# Patient Record
Sex: Female | Born: 1960 | Race: White | Hispanic: No | Marital: Married | Smoking: Never smoker
Health system: Southern US, Community
[De-identification: ages and names within clinical notes are randomized; demographics above are authoritative.]

## PROBLEM LIST (undated history)

## (undated) DIAGNOSIS — G709 Myoneural disorder, unspecified: Secondary | ICD-10-CM

## (undated) DIAGNOSIS — T7840XA Allergy, unspecified, initial encounter: Secondary | ICD-10-CM

## (undated) DIAGNOSIS — K589 Irritable bowel syndrome without diarrhea: Secondary | ICD-10-CM

## (undated) DIAGNOSIS — M519 Unspecified thoracic, thoracolumbar and lumbosacral intervertebral disc disorder: Secondary | ICD-10-CM

## (undated) DIAGNOSIS — D649 Anemia, unspecified: Secondary | ICD-10-CM

## (undated) DIAGNOSIS — M545 Low back pain, unspecified: Secondary | ICD-10-CM

## (undated) DIAGNOSIS — Z9889 Other specified postprocedural states: Secondary | ICD-10-CM

## (undated) DIAGNOSIS — M1990 Unspecified osteoarthritis, unspecified site: Secondary | ICD-10-CM

## (undated) DIAGNOSIS — E785 Hyperlipidemia, unspecified: Secondary | ICD-10-CM

## (undated) DIAGNOSIS — N83209 Unspecified ovarian cyst, unspecified side: Secondary | ICD-10-CM

## (undated) DIAGNOSIS — E039 Hypothyroidism, unspecified: Secondary | ICD-10-CM

## (undated) DIAGNOSIS — F32A Depression, unspecified: Secondary | ICD-10-CM

## (undated) DIAGNOSIS — I493 Ventricular premature depolarization: Secondary | ICD-10-CM

## (undated) DIAGNOSIS — K219 Gastro-esophageal reflux disease without esophagitis: Secondary | ICD-10-CM

## (undated) DIAGNOSIS — F419 Anxiety disorder, unspecified: Secondary | ICD-10-CM

## (undated) DIAGNOSIS — E611 Iron deficiency: Secondary | ICD-10-CM

## (undated) DIAGNOSIS — M5090 Cervical disc disorder, unspecified, unspecified cervical region: Secondary | ICD-10-CM

## (undated) DIAGNOSIS — R112 Nausea with vomiting, unspecified: Secondary | ICD-10-CM

## (undated) DIAGNOSIS — F329 Major depressive disorder, single episode, unspecified: Secondary | ICD-10-CM

## (undated) DIAGNOSIS — S92902A Unspecified fracture of left foot, initial encounter for closed fracture: Secondary | ICD-10-CM

## (undated) HISTORY — DX: Nausea with vomiting, unspecified: R11.2

## (undated) HISTORY — DX: Unspecified ovarian cyst, unspecified side: N83.209

## (undated) HISTORY — DX: Unspecified thoracic, thoracolumbar and lumbosacral intervertebral disc disorder: M51.9

## (undated) HISTORY — DX: Low back pain, unspecified: M54.50

## (undated) HISTORY — DX: Ventricular premature depolarization: I49.3

## (undated) HISTORY — DX: Unspecified fracture of left foot, initial encounter for closed fracture: S92.902A

## (undated) HISTORY — DX: Other specified postprocedural states: Z98.890

## (undated) HISTORY — DX: Irritable bowel syndrome, unspecified: K58.9

## (undated) HISTORY — PX: OTHER SURGICAL HISTORY: SHX169

## (undated) HISTORY — PX: FRACTURE SURGERY: SHX138

## (undated) HISTORY — DX: Myoneural disorder, unspecified: G70.9

## (undated) HISTORY — PX: VAGINAL HYSTERECTOMY: SUR661

## (undated) HISTORY — DX: Anxiety disorder, unspecified: F41.9

## (undated) HISTORY — DX: Unspecified osteoarthritis, unspecified site: M19.90

## (undated) HISTORY — DX: Hyperlipidemia, unspecified: E78.5

## (undated) HISTORY — DX: Hypothyroidism, unspecified: E03.9

## (undated) HISTORY — DX: Iron deficiency: E61.1

## (undated) HISTORY — PX: COLONOSCOPY: SHX174

## (undated) HISTORY — DX: Cervical disc disorder, unspecified, unspecified cervical region: M50.90

## (undated) HISTORY — DX: Major depressive disorder, single episode, unspecified: F32.9

## (undated) HISTORY — DX: Depression, unspecified: F32.A

## (undated) HISTORY — DX: Allergy, unspecified, initial encounter: T78.40XA

## (undated) HISTORY — DX: Low back pain: M54.5

## (undated) HISTORY — DX: Anemia, unspecified: D64.9

## (undated) HISTORY — DX: Gastro-esophageal reflux disease without esophagitis: K21.9

---

## 1998-11-14 ENCOUNTER — Encounter: Admission: RE | Admit: 1998-11-14 | Discharge: 1998-11-14 | Payer: Self-pay | Admitting: *Deleted

## 1998-11-17 ENCOUNTER — Other Ambulatory Visit: Admission: RE | Admit: 1998-11-17 | Discharge: 1998-11-17 | Payer: Self-pay | Admitting: *Deleted

## 1999-12-18 ENCOUNTER — Other Ambulatory Visit: Admission: RE | Admit: 1999-12-18 | Discharge: 1999-12-18 | Payer: Self-pay | Admitting: *Deleted

## 2000-03-22 ENCOUNTER — Encounter (INDEPENDENT_AMBULATORY_CARE_PROVIDER_SITE_OTHER): Payer: Self-pay | Admitting: Specialist

## 2000-03-22 ENCOUNTER — Other Ambulatory Visit: Admission: RE | Admit: 2000-03-22 | Discharge: 2000-03-22 | Payer: Self-pay | Admitting: *Deleted

## 2000-08-15 ENCOUNTER — Observation Stay (HOSPITAL_COMMUNITY): Admission: RE | Admit: 2000-08-15 | Discharge: 2000-08-16 | Payer: Self-pay | Admitting: *Deleted

## 2000-08-15 ENCOUNTER — Encounter (INDEPENDENT_AMBULATORY_CARE_PROVIDER_SITE_OTHER): Payer: Self-pay | Admitting: Specialist

## 2001-02-18 ENCOUNTER — Other Ambulatory Visit: Admission: RE | Admit: 2001-02-18 | Discharge: 2001-02-18 | Payer: Self-pay | Admitting: *Deleted

## 2002-03-10 ENCOUNTER — Other Ambulatory Visit: Admission: RE | Admit: 2002-03-10 | Discharge: 2002-03-10 | Payer: Self-pay | Admitting: *Deleted

## 2002-03-19 ENCOUNTER — Ambulatory Visit (HOSPITAL_COMMUNITY): Admission: RE | Admit: 2002-03-19 | Discharge: 2002-03-19 | Payer: Self-pay | Admitting: *Deleted

## 2002-03-19 ENCOUNTER — Encounter: Payer: Self-pay | Admitting: *Deleted

## 2003-11-09 ENCOUNTER — Ambulatory Visit (HOSPITAL_COMMUNITY): Admission: RE | Admit: 2003-11-09 | Discharge: 2003-11-09 | Payer: Self-pay | Admitting: *Deleted

## 2003-12-28 ENCOUNTER — Other Ambulatory Visit: Admission: RE | Admit: 2003-12-28 | Discharge: 2003-12-28 | Payer: Self-pay | Admitting: *Deleted

## 2005-06-04 ENCOUNTER — Other Ambulatory Visit: Admission: RE | Admit: 2005-06-04 | Discharge: 2005-06-04 | Payer: Self-pay | Admitting: *Deleted

## 2005-09-11 ENCOUNTER — Ambulatory Visit: Payer: Self-pay | Admitting: Internal Medicine

## 2005-10-17 ENCOUNTER — Ambulatory Visit: Payer: Self-pay | Admitting: Internal Medicine

## 2005-10-19 ENCOUNTER — Ambulatory Visit (HOSPITAL_COMMUNITY): Admission: RE | Admit: 2005-10-19 | Discharge: 2005-10-19 | Payer: Self-pay | Admitting: Internal Medicine

## 2005-10-19 ENCOUNTER — Ambulatory Visit: Payer: Self-pay | Admitting: Cardiology

## 2005-11-01 ENCOUNTER — Ambulatory Visit: Payer: Self-pay | Admitting: Gastroenterology

## 2005-11-16 ENCOUNTER — Ambulatory Visit (HOSPITAL_COMMUNITY): Admission: RE | Admit: 2005-11-16 | Discharge: 2005-11-16 | Payer: Self-pay | Admitting: Gastroenterology

## 2005-11-16 LAB — HM COLONOSCOPY: HM Colonoscopy: NORMAL

## 2005-11-21 ENCOUNTER — Ambulatory Visit: Payer: Self-pay | Admitting: Gastroenterology

## 2006-03-11 ENCOUNTER — Ambulatory Visit (HOSPITAL_COMMUNITY): Admission: RE | Admit: 2006-03-11 | Discharge: 2006-03-11 | Payer: Self-pay | Admitting: *Deleted

## 2006-09-30 ENCOUNTER — Inpatient Hospital Stay (HOSPITAL_COMMUNITY): Admission: AD | Admit: 2006-09-30 | Discharge: 2006-10-01 | Payer: Self-pay | Admitting: *Deleted

## 2006-09-30 ENCOUNTER — Other Ambulatory Visit: Payer: Self-pay

## 2006-11-29 DIAGNOSIS — R519 Headache, unspecified: Secondary | ICD-10-CM | POA: Insufficient documentation

## 2006-11-29 DIAGNOSIS — R51 Headache: Secondary | ICD-10-CM

## 2006-11-29 DIAGNOSIS — E039 Hypothyroidism, unspecified: Secondary | ICD-10-CM | POA: Insufficient documentation

## 2007-03-12 ENCOUNTER — Ambulatory Visit: Payer: Self-pay | Admitting: Internal Medicine

## 2007-03-12 DIAGNOSIS — F411 Generalized anxiety disorder: Secondary | ICD-10-CM

## 2007-03-12 DIAGNOSIS — F329 Major depressive disorder, single episode, unspecified: Secondary | ICD-10-CM

## 2007-03-12 DIAGNOSIS — K589 Irritable bowel syndrome without diarrhea: Secondary | ICD-10-CM

## 2007-03-12 DIAGNOSIS — M546 Pain in thoracic spine: Secondary | ICD-10-CM | POA: Insufficient documentation

## 2007-03-12 DIAGNOSIS — G43009 Migraine without aura, not intractable, without status migrainosus: Secondary | ICD-10-CM | POA: Insufficient documentation

## 2007-03-12 DIAGNOSIS — J309 Allergic rhinitis, unspecified: Secondary | ICD-10-CM | POA: Insufficient documentation

## 2007-03-12 DIAGNOSIS — H6690 Otitis media, unspecified, unspecified ear: Secondary | ICD-10-CM | POA: Insufficient documentation

## 2007-03-12 DIAGNOSIS — R109 Unspecified abdominal pain: Secondary | ICD-10-CM | POA: Insufficient documentation

## 2007-03-12 DIAGNOSIS — B009 Herpesviral infection, unspecified: Secondary | ICD-10-CM | POA: Insufficient documentation

## 2007-03-12 DIAGNOSIS — E785 Hyperlipidemia, unspecified: Secondary | ICD-10-CM | POA: Insufficient documentation

## 2007-03-12 DIAGNOSIS — M545 Low back pain: Secondary | ICD-10-CM

## 2007-03-12 DIAGNOSIS — R609 Edema, unspecified: Secondary | ICD-10-CM

## 2007-03-12 LAB — CONVERTED CEMR LAB
Bilirubin Urine: NEGATIVE
Hemoglobin, Urine: NEGATIVE
Leukocytes, UA: NEGATIVE
Nitrite: NEGATIVE
Specific Gravity, Urine: >=1.03 (ref 1.000–1.03)
Urine Glucose: NEGATIVE mg/dL
Urobilinogen, UA: 0.2 (ref 0.0–1.0)
pH: 6 (ref 5.0–8.0)

## 2007-03-13 ENCOUNTER — Encounter: Payer: Self-pay | Admitting: Internal Medicine

## 2007-08-05 ENCOUNTER — Other Ambulatory Visit: Admission: RE | Admit: 2007-08-05 | Discharge: 2007-08-05 | Payer: Self-pay | Admitting: Gynecology

## 2007-10-29 ENCOUNTER — Telehealth (INDEPENDENT_AMBULATORY_CARE_PROVIDER_SITE_OTHER): Payer: Self-pay | Admitting: *Deleted

## 2007-12-10 ENCOUNTER — Ambulatory Visit: Payer: Self-pay | Admitting: Internal Medicine

## 2008-04-24 IMAGING — CT CT ABDOMEN W/ CM
2 of 5 series · 17 of 46 positions shown, 19 images · IV contrast (APPLIED)
Comparison: none

CLINICAL DATA: Left lower quadrant pain. Nausea.  
 ABDOMEN CT WITH CONTRAST:
TECHNIQUE: Multidetector CT imaging of the abdomen was performed following the standard protocol during bolus administration of intravenous contrast.
 Contrast:  100 cc Omnipaque 300.
TECHNIQUE: Multidetector CT imaging of the pelvis was performed following the standard protocol during bolus administration of intravenous contrast.

[Series 2: abd_pel 5.0 b30f st · axial · 0.64mm/px · z∈[-488,-124]mm · 14 of 83 slices shown, 16 images]
[im 5/83  soft-tissue]
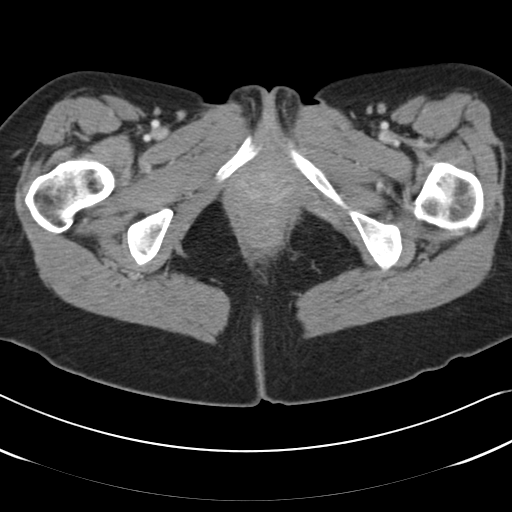
[im 5/83  bone]
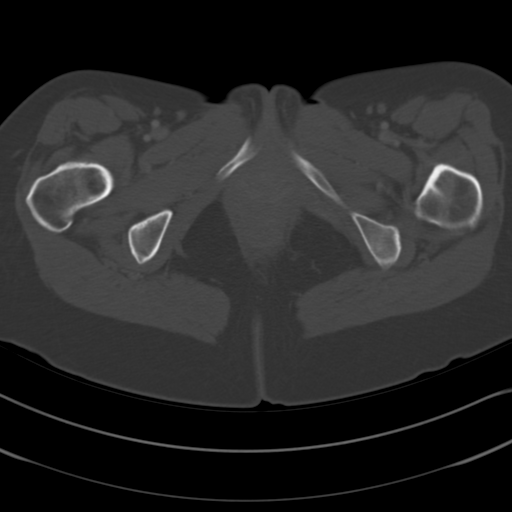
[im 10/83  soft-tissue]
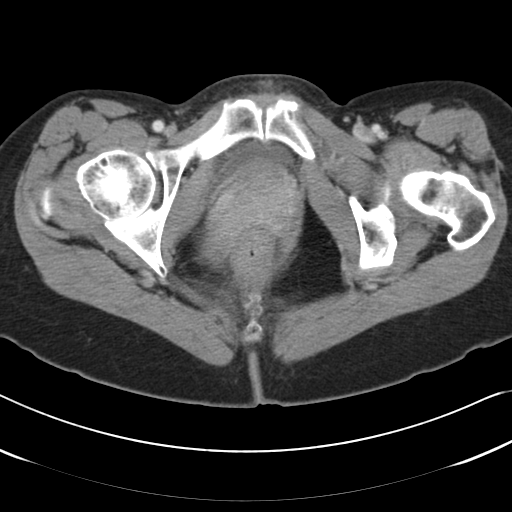
[im 19/83  soft-tissue]
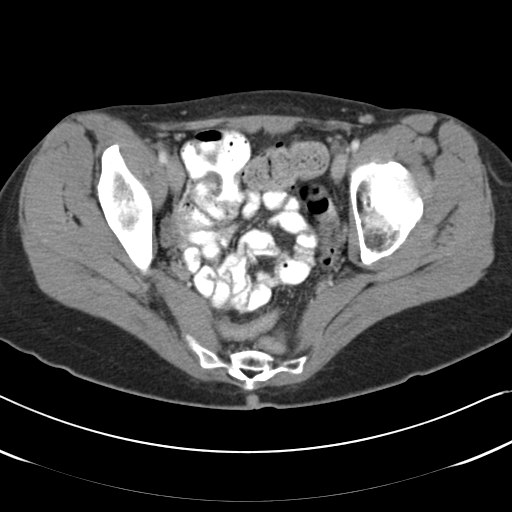
[im 23/83  soft-tissue]
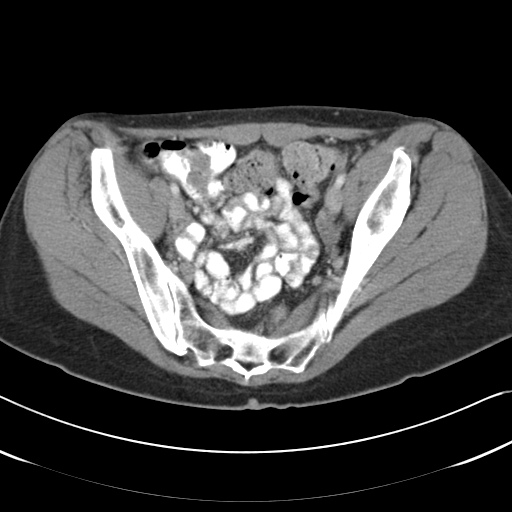
[im 28/83  soft-tissue]
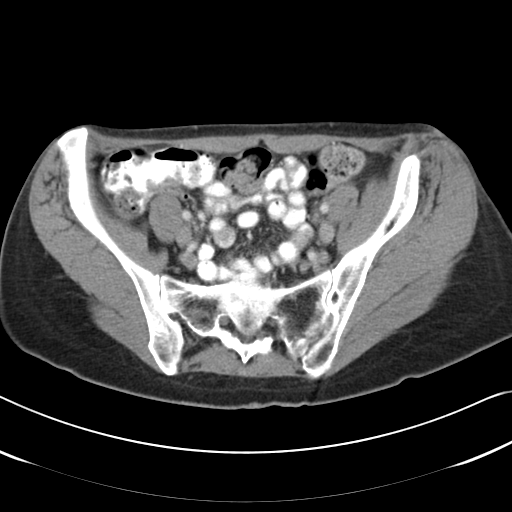
[im 32/83  soft-tissue]
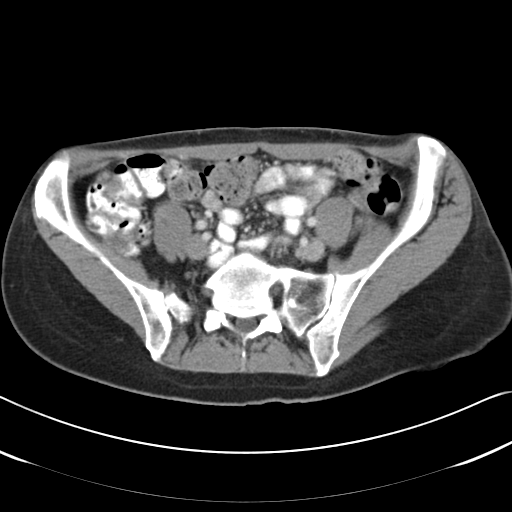
[im 37/83  soft-tissue]
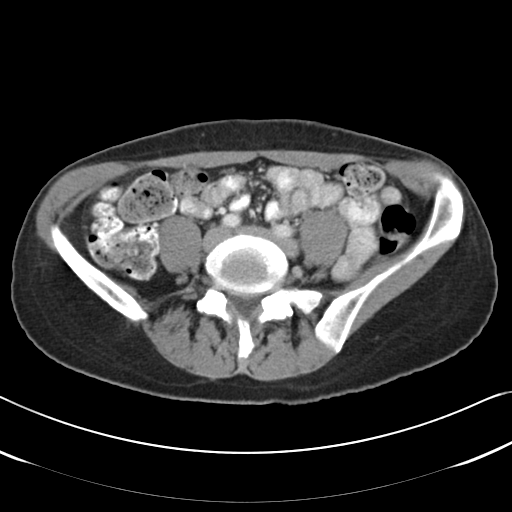
[im 46/83  soft-tissue]
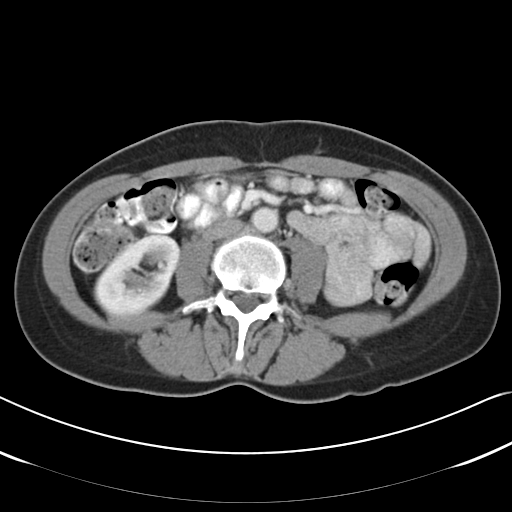
[im 51/83  soft-tissue]
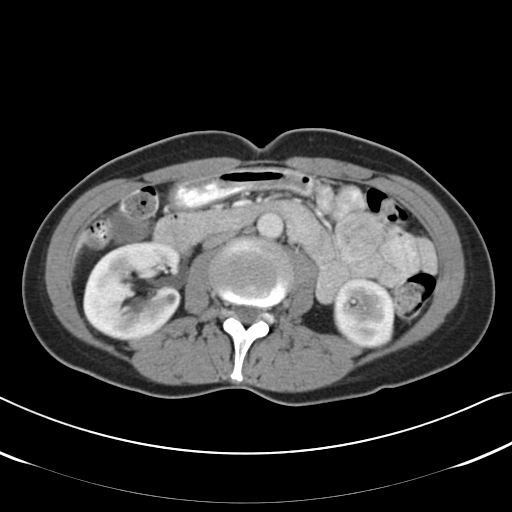
[im 51/83  bone]
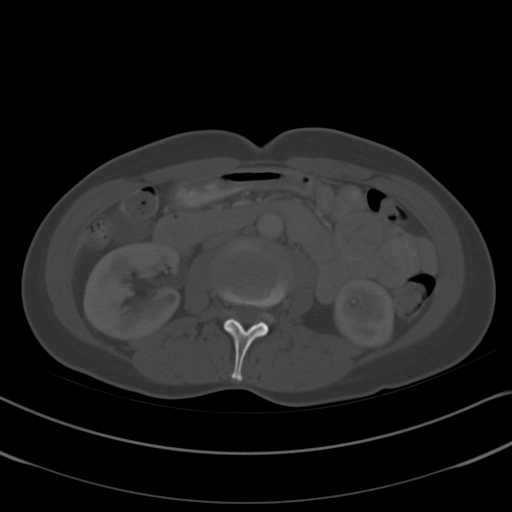
[im 55/83  soft-tissue]
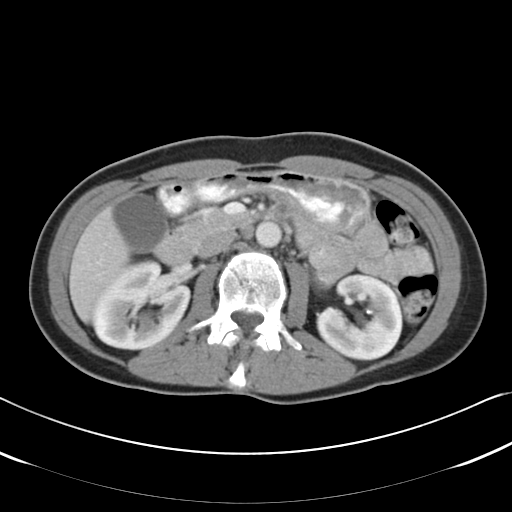
[im 60/83  soft-tissue]
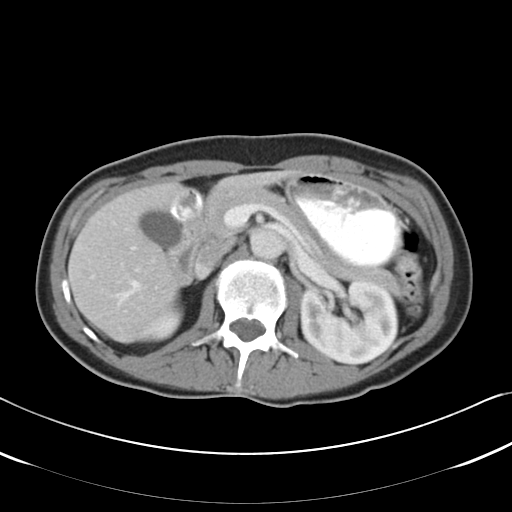
[im 64/83  soft-tissue]
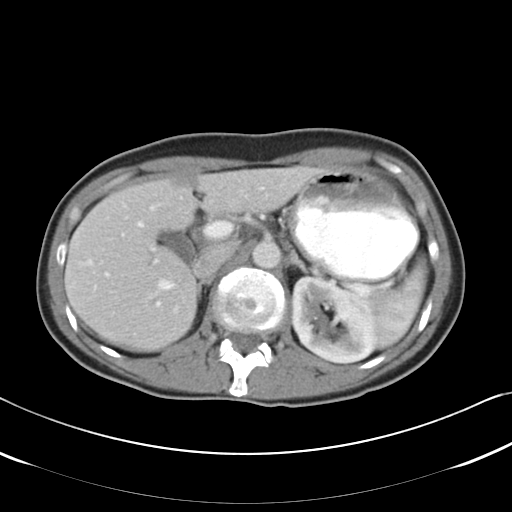
[im 73/83  soft-tissue]
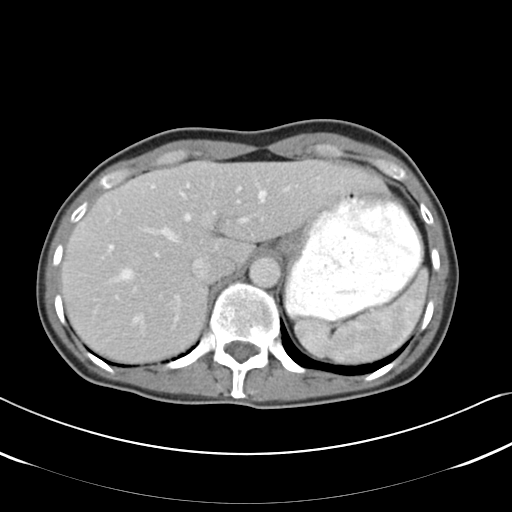
[im 78/83  soft-tissue]
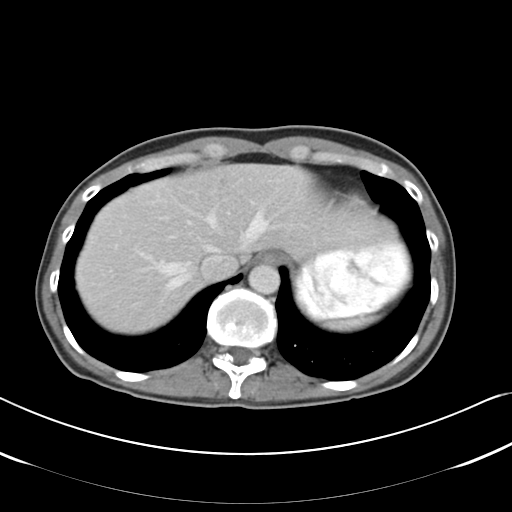

[Series 602: <mpr range> · coronal · 0.84mm/px · 3 of 39 slices shown]
[im 13/39  soft-tissue]
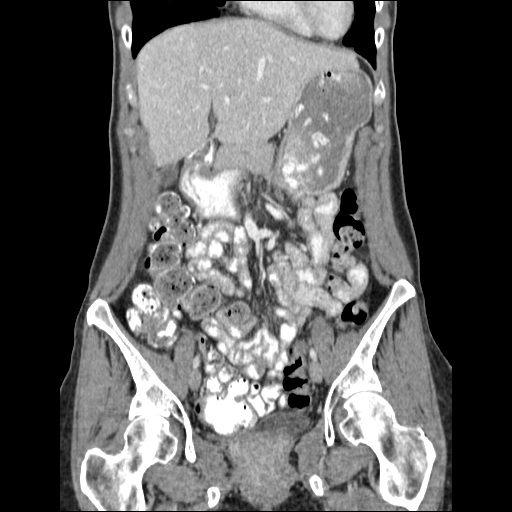
[im 17/39  soft-tissue]
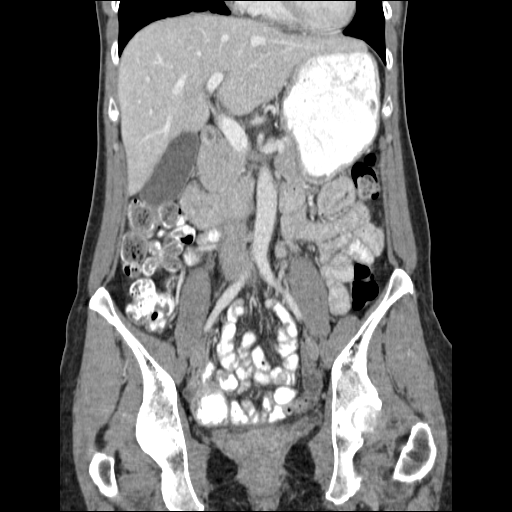
[im 22/39  soft-tissue]
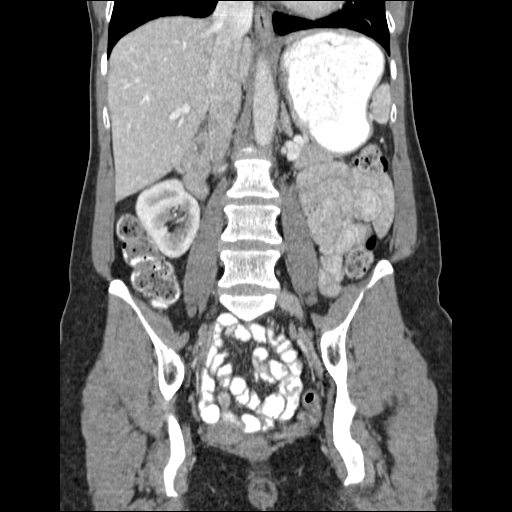

[17 of 46 positions shown; findings below may reference images not displayed]

FINDINGS: Normal liver, spleen, pancreas, kidneys and adrenal glands.  No mesenteric abnormality.  No free intraperitoneal gas or fluid.  No unusual bowel wall thickening.
IMPRESSION: Negative abdominal CT scan.  
 PELVIS CT WITH CONTRAST:
FINDINGS: Hysterectomy.  1.5 x 2.0 cm fluid filled left ovarian lesion compatible with a follicle or cyst.  No free fluid.
IMPRESSION: Unremarkable CT scan of the pelvis.

## 2008-06-03 ENCOUNTER — Telehealth: Payer: Self-pay | Admitting: Internal Medicine

## 2008-07-06 ENCOUNTER — Telehealth (INDEPENDENT_AMBULATORY_CARE_PROVIDER_SITE_OTHER): Payer: Self-pay | Admitting: *Deleted

## 2008-08-19 ENCOUNTER — Ambulatory Visit (HOSPITAL_COMMUNITY): Admission: RE | Admit: 2008-08-19 | Discharge: 2008-08-19 | Payer: Self-pay | Admitting: Gynecology

## 2008-10-11 LAB — CONVERTED CEMR LAB: Pap Smear: NORMAL

## 2008-11-01 LAB — HM MAMMOGRAPHY: HM Mammogram: NORMAL

## 2008-11-09 ENCOUNTER — Encounter: Payer: Self-pay | Admitting: Internal Medicine

## 2008-11-12 ENCOUNTER — Telehealth: Payer: Self-pay | Admitting: Internal Medicine

## 2008-11-12 DIAGNOSIS — S92909A Unspecified fracture of unspecified foot, initial encounter for closed fracture: Secondary | ICD-10-CM | POA: Insufficient documentation

## 2008-11-30 ENCOUNTER — Ambulatory Visit: Payer: Self-pay | Admitting: Internal Medicine

## 2008-11-30 ENCOUNTER — Ambulatory Visit: Payer: Self-pay | Admitting: Family Medicine

## 2008-11-30 LAB — CONVERTED CEMR LAB
ALT: 18 units/L (ref 0–35)
AST: 21 units/L (ref 0–37)
Albumin: 4.1 g/dL (ref 3.5–5.2)
Alkaline Phosphatase: 60 units/L (ref 39–117)
BUN: 21 mg/dL (ref 6–23)
Basophils Absolute: 0 10*3/uL (ref 0.0–0.1)
Basophils Relative: 0.8 % (ref 0.0–3.0)
Bilirubin Urine: NEGATIVE
Bilirubin, Direct: 0.1 mg/dL (ref 0.0–0.3)
CO2: 29 meq/L (ref 19–32)
Calcium: 9.1 mg/dL (ref 8.4–10.5)
Chloride: 105 meq/L (ref 96–112)
Cholesterol: 202 mg/dL — ABNORMAL HIGH (ref 0–200)
Creatinine, Ser: 0.6 mg/dL (ref 0.4–1.2)
Direct LDL: 98.2 mg/dL
Eosinophils Absolute: 0.2 10*3/uL (ref 0.0–0.7)
Eosinophils Relative: 4.7 % (ref 0.0–5.0)
GFR calc non Af Amer: 112.88 mL/min (ref >60)
Glucose, Bld: 90 mg/dL (ref 70–99)
HCT: 37.1 % (ref 36.0–46.0)
HDL: 89.4 mg/dL (ref >39.00)
Hemoglobin, Urine: NEGATIVE
Hemoglobin: 12.9 g/dL (ref 12.0–15.0)
Ketones, ur: NEGATIVE mg/dL
Leukocytes, UA: NEGATIVE
Lymphocytes Relative: 45.6 % (ref 12.0–46.0)
Lymphs Abs: 2 10*3/uL (ref 0.7–4.0)
MCHC: 34.7 g/dL (ref 30.0–36.0)
MCV: 93.2 fL (ref 78.0–100.0)
Monocytes Absolute: 0.4 10*3/uL (ref 0.1–1.0)
Monocytes Relative: 8.7 % (ref 3.0–12.0)
Neutro Abs: 1.7 10*3/uL (ref 1.4–7.7)
Neutrophils Relative %: 40.2 % — ABNORMAL LOW (ref 43.0–77.0)
Nitrite: NEGATIVE
Platelets: 174 10*3/uL (ref 150.0–400.0)
Potassium: 4.1 meq/L (ref 3.5–5.1)
RBC: 3.99 M/uL (ref 3.87–5.11)
RDW: 12.3 % (ref 11.5–14.6)
Sodium: 140 meq/L (ref 135–145)
Specific Gravity, Urine: 1.025 (ref 1.000–1.030)
TSH: 3.12 microintl units/mL (ref 0.35–5.50)
Total Bilirubin: 0.7 mg/dL (ref 0.3–1.2)
Total CHOL/HDL Ratio: 2
Total Protein, Urine: NEGATIVE mg/dL
Total Protein: 7.2 g/dL (ref 6.0–8.3)
Triglycerides: 33 mg/dL (ref 0.0–149.0)
Urine Glucose: NEGATIVE mg/dL
Urobilinogen, UA: 0.2 (ref 0.0–1.0)
VLDL: 6.6 mg/dL (ref 0.0–40.0)
WBC: 4.3 10*3/uL — ABNORMAL LOW (ref 4.5–10.5)
pH: 5.5 (ref 5.0–8.0)

## 2008-12-01 LAB — CONVERTED CEMR LAB
Calcium, Total (PTH): 9.7 mg/dL (ref 8.4–10.5)
PTH: 64.5 pg/mL (ref 14.0–72.0)
Vit D, 25-Hydroxy: 49 ng/mL (ref 30–89)

## 2009-01-10 ENCOUNTER — Telehealth: Payer: Self-pay | Admitting: Internal Medicine

## 2009-01-14 ENCOUNTER — Telehealth (INDEPENDENT_AMBULATORY_CARE_PROVIDER_SITE_OTHER): Payer: Self-pay | Admitting: *Deleted

## 2009-03-10 ENCOUNTER — Telehealth: Payer: Self-pay | Admitting: Internal Medicine

## 2009-04-11 ENCOUNTER — Ambulatory Visit: Payer: Self-pay | Admitting: Internal Medicine

## 2009-04-11 DIAGNOSIS — M549 Dorsalgia, unspecified: Secondary | ICD-10-CM | POA: Insufficient documentation

## 2009-04-11 DIAGNOSIS — J019 Acute sinusitis, unspecified: Secondary | ICD-10-CM

## 2009-04-11 LAB — CONVERTED CEMR LAB
Bilirubin Urine: NEGATIVE
Bilirubin Urine: NEGATIVE
Blood in Urine, dipstick: NEGATIVE
Glucose, Urine, Semiquant: NEGATIVE
Hemoglobin, Urine: NEGATIVE
Ketones, ur: NEGATIVE mg/dL
Ketones, urine, test strip: NEGATIVE
Leukocytes, UA: NEGATIVE
Nitrite: NEGATIVE
Nitrite: NEGATIVE
Protein, U semiquant: NEGATIVE
Specific Gravity, Urine: 1.015
Specific Gravity, Urine: 1.02 (ref 1.000–1.030)
Total Protein, Urine: NEGATIVE mg/dL
Urine Glucose: NEGATIVE mg/dL
Urobilinogen, UA: 0.2
Urobilinogen, UA: 0.2 (ref 0.0–1.0)
WBC Urine, dipstick: NEGATIVE
pH: 6.5
pH: 6.5 (ref 5.0–8.0)

## 2009-10-03 ENCOUNTER — Telehealth: Payer: Self-pay | Admitting: Gastroenterology

## 2009-10-04 ENCOUNTER — Ambulatory Visit: Payer: Self-pay | Admitting: Gastroenterology

## 2009-10-04 ENCOUNTER — Ambulatory Visit: Payer: Self-pay | Admitting: Internal Medicine

## 2009-10-04 DIAGNOSIS — K625 Hemorrhage of anus and rectum: Secondary | ICD-10-CM

## 2009-10-04 DIAGNOSIS — R110 Nausea: Secondary | ICD-10-CM

## 2009-10-04 DIAGNOSIS — K602 Anal fissure, unspecified: Secondary | ICD-10-CM

## 2009-10-04 LAB — CONVERTED CEMR LAB
Basophils Absolute: 0 10*3/uL (ref 0.0–0.1)
Basophils Relative: 0.6 % (ref 0.0–3.0)
Eosinophils Absolute: 0.2 10*3/uL (ref 0.0–0.7)
Eosinophils Relative: 3.4 % (ref 0.0–5.0)
HCT: 35.4 % — ABNORMAL LOW (ref 36.0–46.0)
Hemoglobin: 12.3 g/dL (ref 12.0–15.0)
Lymphocytes Relative: 41.8 % (ref 12.0–46.0)
Lymphs Abs: 2.4 10*3/uL (ref 0.7–4.0)
MCHC: 34.7 g/dL (ref 30.0–36.0)
MCV: 94.4 fL (ref 78.0–100.0)
Monocytes Absolute: 0.6 10*3/uL (ref 0.1–1.0)
Monocytes Relative: 10.1 % (ref 3.0–12.0)
Neutro Abs: 2.5 10*3/uL (ref 1.4–7.7)
Neutrophils Relative %: 44.1 % (ref 43.0–77.0)
Platelets: 161 10*3/uL (ref 150.0–400.0)
RBC: 3.75 M/uL — ABNORMAL LOW (ref 3.87–5.11)
RDW: 12.7 % (ref 11.5–14.6)
WBC: 5.6 10*3/uL (ref 4.5–10.5)

## 2009-10-07 LAB — CONVERTED CEMR LAB
ALT: 17 units/L (ref 0–35)
AST: 23 units/L (ref 0–37)
Albumin: 4.5 g/dL (ref 3.5–5.2)
Alkaline Phosphatase: 48 units/L (ref 39–117)
Bilirubin, Direct: 0.1 mg/dL (ref 0.0–0.3)
H Pylori IgG: POSITIVE
Total Bilirubin: 0.5 mg/dL (ref 0.3–1.2)
Total Protein: 7.2 g/dL (ref 6.0–8.3)

## 2009-10-13 ENCOUNTER — Telehealth: Payer: Self-pay | Admitting: Physician Assistant

## 2009-10-14 ENCOUNTER — Ambulatory Visit: Payer: Self-pay | Admitting: Gastroenterology

## 2009-10-14 LAB — CONVERTED CEMR LAB
Basophils Absolute: 0 10*3/uL (ref 0.0–0.1)
Basophils Relative: 0.6 % (ref 0.0–3.0)
Eosinophils Absolute: 0.2 10*3/uL (ref 0.0–0.7)
Eosinophils Relative: 3.6 % (ref 0.0–5.0)
HCT: 35.7 % — ABNORMAL LOW (ref 36.0–46.0)
Hemoglobin: 12.3 g/dL (ref 12.0–15.0)
Lymphocytes Relative: 40.5 % (ref 12.0–46.0)
Lymphs Abs: 2 10*3/uL (ref 0.7–4.0)
MCHC: 34.5 g/dL (ref 30.0–36.0)
MCV: 94.2 fL (ref 78.0–100.0)
Monocytes Absolute: 0.5 10*3/uL (ref 0.1–1.0)
Monocytes Relative: 9.5 % (ref 3.0–12.0)
Neutro Abs: 2.3 10*3/uL (ref 1.4–7.7)
Neutrophils Relative %: 45.8 % (ref 43.0–77.0)
Platelets: 154 10*3/uL (ref 150.0–400.0)
RBC: 3.79 M/uL — ABNORMAL LOW (ref 3.87–5.11)
RDW: 13.2 % (ref 11.5–14.6)
WBC: 4.9 10*3/uL (ref 4.5–10.5)

## 2009-10-20 ENCOUNTER — Ambulatory Visit: Payer: Self-pay | Admitting: Gastroenterology

## 2009-11-07 ENCOUNTER — Telehealth: Payer: Self-pay | Admitting: Gastroenterology

## 2010-02-25 ENCOUNTER — Encounter: Payer: Self-pay | Admitting: Gynecology

## 2010-02-26 ENCOUNTER — Encounter: Payer: Self-pay | Admitting: *Deleted

## 2010-03-06 ENCOUNTER — Telehealth: Payer: Self-pay | Admitting: Gastroenterology

## 2010-03-07 NOTE — Letter (Signed)
Summary: EGD Instructions  [facility]  [address] [place], [place] [address]   Phone: [phone]  Fax: [phone]       [name]    1960/08/05    MRN: [id number]       Procedure Day /Date:10-20-09     Arrival Time: 2:30 PM      Procedure Time: 3:30 PM     Location of Procedure:                    X     [facility] (4th Floor)  PREPARATION FOR ENDOSCOPY   On 10-20-09 THE DAY OF THE PROCEDURE:  1.   No solid foods, milk or milk products are allowed after midnight the night before your procedure.  2.   Do not drink anything colored red or purple.  Avoid juices with pulp.  No orange juice.  3.  You may drink clear liquids until 1:30 PM  which is 2 hours before your procedure.                                                                                                CLEAR LIQUIDS INCLUDE: Water Jello Ice Popsicles Tea (sugar ok, no milk/cream) Powdered fruit flavored drinks Coffee (sugar ok, no milk/cream) Gatorade Juice: apple, white grape, white cranberry  Lemonade Clear bullion, consomm, broth Carbonated beverages (any kind) Strained chicken noodle soup Hard Candy   MEDICATION INSTRUCTIONS  Unless otherwise instructed, you should take regular prescription medications with a small sip of water as early as possible the morning of your procedure.          OTHER INSTRUCTIONS  You will need a responsible adult at least 51 years of age to accompany you and drive you home.   This person must remain in the waiting room during your procedure.  Wear loose fitting clothing that is easily removed.  Leave jewelry and other valuables at home.  However, you may wish to bring a book to read or an iPod/MP3 player to listen to music as you wait for your procedure to start.  Remove all body piercing jewelry and leave at home.  Total time from sign-in until discharge is approximately 2-3 hours.  You should go home directly after  your procedure and rest.  You can resume normal activities the day after your procedure.  The day of your procedure you should not:   Drive   Make legal decisions   Operate machinery   Drink alcohol   Return to work  You will receive specific instructions about eating, activities and medications before you leave.    The above instructions have been reviewed and explained to me by   _______________________    I fully understand and can verbalize these instructions _____________________________ Date _________

## 2010-03-07 NOTE — Letter (Signed)
Summary: Out of Work  [facility]  [address] [place], [place] [address]   Phone: [phone]  Fax: [phone]    April 11, 2009   Employee:  [name]    To Whom It May Concern:   For Medical reasons, please excuse the above named employee from work for the following dates:  Start:   Apr 07, 2009  End:   Apr 11, 2009   -   to return to work Apr 12, 2009  If you need additional information, please feel free to contact our office.         Sincerely,    Corwin Levins MD

## 2010-03-07 NOTE — Progress Notes (Signed)
Summary: Triage  Medications Added ZOFRAN 4 MG TABS (ONDANSETRON HCL) 1 by mouth q 6 hrs as needed n/v       Phone Note Call from Patient   Caller: Patient Call For: [name] Reason for Call: Talk to Nurse Summary of Call: pt. had a BM this morning with dark black stools and still nausea Initial call taken by: Karna Christmas,  October 13, 2009 9:06 AM  Follow-up for Phone Call        Pt. seen 10-04-09 and is scheduled for an Endo. 10-20-09. Started Pylera for positive H-Pylori on 10-08-09. Pt. continues to feel nausea and dizzy. She did have a black stool this morning.  Pt. wants some Zofran for the nausea. (Please send to [facility] [address])  Elvin Banker-Please review and advise  Follow-up by: Laureen Ochs LPN,  October 13, 2009 2:39 PM  Additional Follow-up for Phone Call Additional follow up Details #1::        HAVE HER INCREASE THE NEXIUM TO TWICE DAILY,CAN GIVE HER SOME SAMPLES, AND YES CAN GIVE HER ZOFRAN 4 MG  Q 6 HOURS AS NEEDED FOR NAUSEA. GET A FOLLOW UP CBC TODAY. EGD IS NEXT WEEK. THANKS. Additional Follow-up by: Peterson Ao,  October 14, 2009 9:31 AM    Additional Follow-up for Phone Call Additional follow up Details #2::    Zofran to pt. pharmacy. Nexium samples out front. Lab order is in IDX. Message left for patient to callback. Laureen Ochs LPN  October 14, 2009 9:41 AM   Above PA orders reviewed with patient. Pt. to keep procedure appt. Pt. instructed to call back as needed.  Follow-up by: Laureen Ochs LPN,  October 14, 2009 11:23 AM  New/Updated Medications: ZOFRAN 4 MG TABS (ONDANSETRON HCL) 1 by mouth q 6 hrs as needed n/v Prescriptions: ZOFRAN 4 MG TABS (ONDANSETRON HCL) 1 by mouth q 6 hrs as needed n/v  #30 x 1   Entered by:   Laureen Ochs LPN   Authorized by:   Louis Meckel MD   Signed by:   Laureen Ochs LPN on 78/29/5621   Method used:   Electronically to        [facility]. [phone]* (retail)       [address]       [place], [place]  [address]       Ph: [phone]       Fax: [phone]   RxID:   [id number]

## 2010-03-07 NOTE — Assessment & Plan Note (Signed)
Summary: RECTAL BLEEDING       (DR.KAPLAN PT.)      [name]    History of Present Illness Primary GI MD: Melvia Heaps MD [facility] Chief Complaint: Pt had change in bowels x 3 weeks ago with some rectal bleeding after forcing a bowel movement. Pt states she thinks she has a tear in her rectum with sever pain and burning.  History of Present Illness:   50 YO FEMALE REFERRED BY DR, Jonny Ruiz. SHE IS KNOWN TO DR. KAPLAN,SEEN IN 2007 WITH NORMAL COLONOSCOPY. COMES IN NOW WITH C/O CHANGE IN HER BOWEL HABITS  OVER THE PAST 3-4 WEEKS. LOTS OF STARINING,PASTY STOOLS, AND SMALL AMOUNTS  OF BRB PER RECTUM INTERMITTENT. SHE FEELS LIKE SHE HAS A RECTAL TEAR.   SHE HAS ALSO HAD NAUSEA OFF AND ON,FATIGUE, AND SOME DIZZINESS. SAYS SHE IS NOT SLEEPING WELL, HAS HOT FLASHES AT NIGHT ETC.. SHE TAKES  ABOUT 8 ADVIL PER DAY AND 2-3 ALEVE PER DAY FOR DISC PAIN IN HER NECK.     GI Review of Systems    Reports nausea.      Denies abdominal pain, acid reflux, belching, bloating, chest pain, dysphagia with liquids, dysphagia with solids, heartburn, loss of appetite, vomiting, vomiting blood, weight loss, and  weight gain.      Reports change in bowel habits, rectal bleeding, and  rectal pain.     Denies anal fissure, black tarry stools, constipation, diarrhea, diverticulosis, fecal incontinence, heme positive stool, hemorrhoids, irritable bowel syndrome, jaundice, light color stool, and  liver problems.    Current Medications (verified): 1)  Synthroid 50 Mcg Tabs (Levothyroxine Sodium) .Marland Kitchen.. 1 By Mouth Once Daily 2)  Valtrex 1 Gm Tabs (Valacyclovir Hcl) .Marland Kitchen.. 1 Gm By Mouth Two Times A Day X 5 Days As Needed 3)  Alprazolam 0.5 Mg Tbdp (Alprazolam) .... 1/2 - 1 By Mouth Two Times A Day As Needed Nerves As Needed 4)  Roxicet 5-325 Mg Tabs (Oxycodone-Acetaminophen) 5)  Zofran 4 Mg Tabs (Ondansetron Hcl) .Marland Kitchen.. 1 By Mouth Q 6 Hrs As Needed N/v 6)  Relpax 40 Mg Tabs (Eletriptan Hydrobromide) .Marland Kitchen.. 1po Once Daily As Needed 7)   Aleve 220 Mg Tabs (Naproxen Sodium) .... 2 Tablets By Mouth Once Daily 8)  Advil 200 Mg Tabs (Ibuprofen) .... 4 Tablets By Mouth Two Times A Day 9)  Tramadol Hcl 50 Mg Tabs (Tramadol Hcl) .... 1/2 Tablet By Mouth Once Daily 10)  Biotin 10 Mg Tabs (Biotin) .... One Tablet By Mouth Once Daily 11)  Gaviscon Extra Strength 160-105 Mg Chew (Alum Hydroxide-Mag Carbonate) .... One Tablet By Mouth As Needed 12)  Gas-X 80 Mg Chew (Simethicone) .... One Tablet By Mouth As Needed  Allergies (verified): 1)  ! Sulfa  Past History:  Past Medical History: Reviewed history from 11/30/2008 and no changes required. Headache Hypothyroidism History of coccyx fx History of anemia secondary to fibroid sx Low back pain Anxiety Depression Allergic rhinitis hx of bilat ovary cyst IBS Hyperlipidemia hx of foot fx 2010, also hx of left foot fx 2008  Past Surgical History: Fibroid sx Hysterectomy s/p right lateral epicondylar surgury dec 2009 COLONOSCOPY 10/07, NORMAL  Family History: Reviewed history from 03/12/2007 and no changes required. mother and father with alcoholism mother ? died with SDH father died with MI  Social History: Reviewed history from 03/12/2007 and no changes required. 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  The patient denies allergy/sinus, anemia, anxiety-new, arthritis/joint pain, blood in urine, breast changes/lumps, change in vision, confusion, cough, coughing up blood, depression-new, fainting, fever, hearing problems, heart murmur, heart rhythm changes, itching, menstrual pain, muscle pains/cramps, nosebleeds, pregnancy symptoms, shortness of breath, skin rash, sore throat, swelling of feet/legs, swollen lymph glands, thirst - excessive , urination - excessive , urination changes/pain, urine leakage, vision  changes, and voice change.         SEE HPI  Vital Signs:  Patient profile:   50 year old female Height:      63 inches Weight:      127.50 pounds BMI:     22.67 Pulse rate:   60 / minute Pulse rhythm:   regular BP sitting:   98 / 52  (left arm) Cuff size:   regular  Vitals Entered By: Christie Nottingham CMA Duncan Dull) (October 04, 2009 1:30 PM)  Physical Exam  General:  Well developed, well nourished, no acute distress. Head:  Normocephalic and atraumatic. Eyes:  PERRLA, no icterus. Lungs:  Clear throughout to auscultation. Heart:  Regular rate and rhythm; no murmurs, rubs,  or bruits. Abdomen:  SOFT, NONTENDER, NO MASS OR HSM,BS+ Rectal:  SHALLOW FISSURE PALP AT 5 OCLOCK POSITION. Neurologic:  Alert and  oriented x4;  grossly normal neurologically. Psych:  Alert and cooperative. Normal mood and affect.   Impression & Recommendations:  Problem # 1:  ANAL FISSURE (ICD-565.0) Assessment New 50 YO FEMALE WITH 3 WEEK HX OF CONSTIPATION/CHANGE IN BOWEL HABITS, AND NEW ANAL PAIN, SMALL VOLUME HEMATOCHEZIA..SUSPECT CHANGE IN BOWEL MED RELATED WITH PAIN MEDA AND LARGE DOSES OF NSAIDS FOR NECK PAIN  EXAM C/W  ANAL FISSURE. START MIRALAX 17 GM DAILY IN 8 OZ WATER. ANAMANTLE GEL 3-4 X DAILY X  2-3 WEEKS THEN AS NEEDED.  Problem # 2:  NAUSEA (ICD-787.02) Assessment: Deteriorated MOST LIKELY SECONDARY TO NSAID INDUCED GASTROPATHY.CANNOT R/O PUD.  ADVISED TO WEAN DOWN OFF NSAIDS START NEXIUM 40 MG  DAILY IN AM BEFORE BREAKFAST. LABS AS BELOW SCHEDULE FOR UPPER ENDOSCOPY WITH DR. KAPLAN. PROCEDURE DISCUSSED IN DETAIL WITH PT. Orders: EGD (EGD) TLB-Hepatic/Liver Function Pnl ([id number]) TLB-H. Pylori Abs(Helicobacter Pylori) ([id number])  Patient Instructions: 1)  We have scheduled the Endoscopy with Dr. Arlyce Dice on 10-20-09. 2)  Directions and brochure provided. 3)  [facility] Patient Information Guide given to patient. 4)  We have given you Nexium samples. 5)  We  have sent a prescription for Anamantle rectal cream to [facility]. 6)  The medication list was reviewed and reconciled.  All changed / newly prescribed medications were explained.  A complete medication list was provided to the patient / caregiver. Prescriptions: PYLERA 140-125-125 MG CAPS (BIS SUBCIT-METRONID-TETRACYC) Take as directed  #120 x 0   Entered by:   Lowry Ram NCMA   Authorized by:   Louis Meckel MD   Signed by:   Lowry Ram NCMA on 10/07/2009   Method used:   Print then Give to Patient   RxID:   [id number] ANAMANTLE HC 3-0.5 % CREA (LIDOCAINE-HYDROCORTISONE ACE) Use the cream rectally 3-4 times daily  #30 cc x 0   Entered by:   Lowry Ram NCMA   Authorized by:   Sammuel Cooper PA-c   Signed by:   Lowry Ram NCMA on 10/04/2009   Method used:   Electronically to        [facility]* (retail)  [address]       [place], [place]  [id number]       Ph: [phone]       Fax: [phone]   RxID:   [id number]  Pylera  prescription done per Dr Arlyce Dice .  Laureen Ochs LPN sent the RX to [facility]. They do not have it and The pt called [facility] and they do not have it.  She asked for a printed RX.

## 2010-03-07 NOTE — Progress Notes (Signed)
Summary: Triage-Rectal Bleeding   Phone Note Call from Patient Call back at Home Phone [phone]   Caller: Patient Call For: Dr Arlyce Dice Reason for Call: Talk to Nurse Summary of Call: Pt said she is extremely weak, HGB is 10 (wks at Ortho- checked there)..has had rectal bleeding off & on x3 weeks.   Said she could come today if we had opening.                   Please call  [phone] Initial call taken by: Misty Stanley,  October 03, 2009 2:29 PM  Follow-up for Phone Call        Pt. c/o rectal bleeding for 3 weeks, hgb.10. Pt. c/o ongoing fatigue. States she has a tear in her rectum, bleeding w/Q BM. Painful BM's.  1) Pt. will see Amy Esterwood PAC on 10-04-09 at 1:30pm.  2) If symptoms become worse call back immediately or go to ER.  AMY--DOES SHE NEED ANY MORE LAB WORK BEFORE SHE SEES YOU?   Follow-up by: Laureen Ochs LPN,  October 03, 2009 2:33 PM  Additional Follow-up for Phone Call Additional follow up Details #1::        cbc  before visit tomorrow Additional Follow-up by: Peterson Ao,  October 03, 2009 3:44 PM    Additional Follow-up for Phone Call Additional follow up Details #2::    Lab order is in IDX, pt. will come tomorrow, before appt. to have drawn. Pt. instructed to call back as needed.  Follow-up by: Laureen Ochs LPN,  October 03, 2009 4:06 PM

## 2010-03-07 NOTE — Procedures (Signed)
Summary: Upper Endoscopy  Patient: [name] Note: All result statuses are Final unless otherwise noted.  Tests: (1) Upper Endoscopy (EGD)   EGD Upper Endoscopy       DONE     [facility]     [address]     [place], [place]  [address]           ENDOSCOPY PROCEDURE REPORT           PATIENT:  [name], [name]  MR#:  [id number]     BIRTHDATE:  11/17/1960, 48 yrs. old  GENDER:  female           ENDOSCOPIST:  Barbette Hair. Arlyce Dice, MD     Referred by:           PROCEDURE DATE:  10/20/2009     PROCEDURE:  EGD, diagnostic     ASA CLASS:  Class II     INDICATIONS:  nausea           MEDICATIONS:   Fentanyl 75 mcg IV, Versed 7 mg IV, glycopyrrolate     (Robinal) 0.2 mg IV, 0.6cc simethancone 0.6 cc PO     TOPICAL ANESTHETIC:  Cetacaine Spray           DESCRIPTION OF PROCEDURE:   After the risks benefits and     alternatives of the procedure were thoroughly explained, informed     consent was obtained.  The LB [id number] endoscope was     introduced through the mouth and advanced to the third portion of     the duodenum, without limitations.  The instrument was slowly     withdrawn as the mucosa was fully examined.     <<PROCEDUREIMAGES>>           The upper, middle, and distal third of the esophagus were     carefully inspected and no abnormalities were noted. The z-line     was well seen at the GEJ. The endoscope was pushed into the fundus     which was normal including a retroflexed view. The antrum,gastric     body, first and second part of the duodenum were unremarkable (see     image1, image2, image3, image4, image5, and image7).     Retroflexed views revealed no abnormalities.    The scope was then     withdrawn from the patient and the procedure completed.           COMPLICATIONS:  None           ENDOSCOPIC IMPRESSION:     1) Normal EGD     RECOMMENDATIONS:     1) continue current medications - hold NSAIDS           2) OP follow-up is advised on a  PRN basis.           REPEAT EXAM:  No           ______________________________     Barbette Hair. Arlyce Dice, MD           CC:  Corwin Levins, MD           n.     Rosalie Doctor:   Barbette Hair. Kaplan at 10/20/2009 03:52 PM           Felix Pacini, [id number]  Note: An exclamation mark (!) indicates a result that was not dispersed into the flowsheet. Document Creation Date: 10/20/2009 3:52 PM _______________________________________________________________________  (1) Order result status: Final Collection or observation  date-time: 10/20/2009 15:44 Requested date-time:  Receipt date-time:  Reported date-time:  Referring Physician:   Ordering Physician: Melvia Heaps [id number]) Specimen Source:  Source: Launa Grill Order Number: [id number] Lab site:

## 2010-03-07 NOTE — Progress Notes (Signed)
Summary: rx change  Phone Note Call from Patient Call back at Home Phone [phone]   Caller: Patient Summary of Call: pt called stating that she never took of thyroid medication, she was always rx'd . pt also says that she did not pick up Rx for rx 10.26.2010 because she has left over from original prescriber. pt pick up Rx today for and she will break it in half (6 mth supply) but she would like this changed in EMR. okay to change? Initial call taken by: Margaret Pyle, CMA,  March 10, 2009 4:44 PM    New/Updated Medications: SYNTHROID 50 MCG TABS (LEVOTHYROXINE SODIUM) 1 by mouth once daily

## 2010-03-07 NOTE — Progress Notes (Signed)
Summary: Medication  Medications Added FLUCONAZOLE 100 MG TABS (FLUCONAZOLE) 1 by mouth once daily x 5 days       Phone Note Call from Patient Call back at Home Phone [phone]   Caller: Patient Call For: Dr. Arlyce Dice Reason for Call: Talk to Nurse Summary of Call: Pt. took Pylera and it has caused a "yeast infections" wants to know if something can be prescribed Initial call taken by: Karna Christmas,  November 07, 2009 4:44 PM  Follow-up for Phone Call        Dr Arlyce Dice please advise if we can call her in something for her yeast infection Follow-up by: Darcey Nora RN, CGRN,  November 07, 2009 4:54 PM  Additional Follow-up for Phone Call Additional follow up Details #1::        Assuming it's a vaginal infection, flucanazole 100mg  once daily for 5 days Additional Follow-up by: Louis Meckel MD,  November 08, 2009 12:06 PM    Additional Follow-up for Phone Call Additional follow up Details #2::    Patient  advised to pick up rx  Follow-up by: Darcey Nora RN, CGRN,  November 08, 2009 2:42 PM  New/Updated Medications: FLUCONAZOLE 100 MG TABS (FLUCONAZOLE) 1 by mouth once daily x 5 days Prescriptions: FLUCONAZOLE 100 MG TABS (FLUCONAZOLE) 1 by mouth once daily x 5 days  #5 x 0   Entered by:   Darcey Nora RN, CGRN   Authorized by:   Louis Meckel MD   Signed by:   Darcey Nora RN, CGRN on 11/08/2009   Method used:   Electronically to        [facility]. [phone]* (retail)       [address]       [place], [place]  [address]       Ph: [phone]       Fax: [phone]   RxID:   [id number]

## 2010-03-07 NOTE — Assessment & Plan Note (Signed)
Summary: COUGH/ SINUS/ DEHYDRATED? / NWS   Vital Signs:  Patient profile:   50 year old female Height:      63 inches Weight:      128.50 pounds BMI:     22.85 O2 Sat:      98 % on Room air Temp:     97.9 degrees F oral Pulse rate:   63 / minute BP sitting:   112 / 72  (left arm) Cuff size:   regular  Vitals Entered ByZella Ball Ewing (April 11, 2009 3:37 PM)  O2 Flow:  Room air CC: cough, chills, fatigue/RE   CC:  cough, chills, and fatigue/RE.  History of Present Illness: here with fever, facial pain, pressure, congestion not sure of color drainage, headhace, malase, chills and fatige for 3 to 4o day;  has been taking augmentin for last 2 days per MD where she works, but now with increased acute on chronic back ache and urinary freq despite complaicne with meds, and episode of diarrhea after starting the augmentin.  Has some nausea as well, but no vomiting, rash or joint pains.  Did also incidently have several severe migraine in the past 2 wks prior to onset of this illness, for which the imitrex did not work.  Has had some increased stressors recently, such as breakup of her daughter with a [place]., and then gained marked wt recently due to dietary indiscretion.    Problems Prior to Update: 1)  Back Pain  (ICD-724.5) 2)  Preventive Health Care  (ICD-V70.0) 3)  Fracture, Foot  (ICD-825.20) 4)  Fracture, Foot  (ICD-825.20) 5)  Fever Blister  (ICD-054.9) 6)  Peripheral Edema  (ICD-782.3) 7)  Common Migraine  (ICD-346.10) 8)  Abdominal Pain, Lower  (ICD-789.09) 9)  Pain in Thoracic Spine  (ICD-724.1) 10)  Otitis Media, Acute, Left  (ICD-382.9) 11)  Hyperlipidemia  (ICD-272.4) 12)  Ibs  (ICD-564.1) 13)  Allergic Rhinitis  (ICD-477.9) 14)  Depression  (ICD-311) 15)  Anxiety  (ICD-300.00) 16)  Low Back Pain  (ICD-724.2) 17)  Hypothyroidism  (ICD-244.9) 18)  Headache  (ICD-784.0)  Medications Prior to Update: 1)  Synthroid 50 Mcg Tabs (Levothyroxine Sodium) .Marland Kitchen.. 1 By  Mouth Once Daily 2)  Imitrex 100 Mg  Tabs (Sumatriptan Succinate) .Marland Kitchen.. 1po Once Daily As Needed 3)  Valtrex 1 Gm Tabs (Valacyclovir Hcl) .Marland Kitchen.. 1 Gm By Mouth Two Times A Day X 5 Days As Needed 4)  Hydrochlorothiazide 25 Mg  Tabs (Hydrochlorothiazide) .... Take 1/2 By Mouth Once Daily 5)  Alprazolam 0.5 Mg Tbdp (Alprazolam) .... 1/2 - 1 By Mouth Two Times A Day As Needed Nerves 6)  Roxicet 5-325 Mg Tabs (Oxycodone-Acetaminophen) 7)  Zofran 4 Mg Tabs (Ondansetron Hcl) .Marland Kitchen.. 1 By Mouth Q 6 Hrs As Needed N/v 8)  Alendronate Sodium 70 Mg Tabs (Alendronate Sodium) .Marland Kitchen.. 1 By Mouth Once Daily  Current Medications (verified): 1)  Synthroid 50 Mcg Tabs (Levothyroxine Sodium) .Marland Kitchen.. 1 By Mouth Once Daily 2)  Valtrex 1 Gm Tabs (Valacyclovir Hcl) .Marland Kitchen.. 1 Gm By Mouth Two Times A Day X 5 Days As Needed 3)  Hydrochlorothiazide 25 Mg  Tabs (Hydrochlorothiazide) .... Take 1/2 By Mouth Once Daily 4)  Alprazolam 0.5 Mg Tbdp (Alprazolam) .... 1/2 - 1 By Mouth Two Times A Day As Needed Nerves 5)  Roxicet 5-325 Mg Tabs (Oxycodone-Acetaminophen) 6)  Zofran 4 Mg Tabs (Ondansetron Hcl) .Marland Kitchen.. 1 By Mouth Q 6 Hrs As Needed N/v 7)  Alendronate Sodium 70 Mg Tabs (  Alendronate Sodium) .Marland Kitchen.. 1 By Mouth Once Daily 8)  Levaquin 500 Mg Tabs (Levofloxacin) .Marland Kitchen.. 1po Once Daily 9)  Relpax 40 Mg Tabs (Eletriptan Hydrobromide) .Marland Kitchen.. 1po Once Daily As Needed  Allergies (verified): 1)  ! 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   Head:  normocephalic and atraumatic.   Eyes:  vision grossly intact, pupils equal, and pupils round.   Ears:  bilat tm's red, sinus tender bilat Nose:  no external deformity and no nasal discharge.   Mouth:  pharyngeal erythema and fair dentition.   Neck:  supple and cervical lymphadenopathy.   Lungs:  normal respiratory effort and normal breath sounds.   Heart:  normal rate and regular rhythm.   Abdomen:  soft and normal bowel sounds.  , but with mild diffuse tender without guarding or rebound Msk:  no joint tenderness and no joint swelling.  , no spine tender, has midl bialt lumbar paravertebral tender but no spasm Extremities:  no edema, no erythema  Skin:  color normal and no rashes.   Psych:  moderately anxious.     Impression & Recommendations:  Problem # 1:  SINUSITIS- ACUTE-NOS (ICD-461.9)  Her updated medication list for this problem includes:    Levaquin 500 Mg Tabs (Levofloxacin) .Marland Kitchen... 1po once daily treat as above, f/u any worsening signs or symptoms   Problem # 2:  BACK PAIN (ICD-724.5)  Her updated medication list for this problem includes:    Roxicet 5-325 Mg Tabs (Oxycodone-acetaminophen)  Orders: T-Culture, Urine ([id number]) TLB-Udip w/ Micro (81001-URINE) ? [facility] but will check urine studies, tx with levaquin as above  Problem # 3:  COMMON MIGRAINE (ICD-346.10)  The following medications were removed from the medication list:    Imitrex 100 Mg Tabs (Sumatriptan succinate) .Marland Kitchen... 1po once daily as needed Her updated medication list for this problem includes:    Roxicet 5-325 Mg Tabs (Oxycodone-acetaminophen)    Relpax 40 Mg Tabs (Eletriptan hydrobromide) .Marland Kitchen... 1po once daily as needed treat as above, f/u any worsening signs or symptoms   Complete Medication List: 1)  Synthroid 50 Mcg Tabs  (Levothyroxine sodium) .Marland Kitchen.. 1 by mouth once daily 2)  Valtrex 1 Gm Tabs (Valacyclovir hcl) .Marland Kitchen.. 1 gm by mouth two times a day x 5 days as needed 3)  Hydrochlorothiazide 25 Mg Tabs (Hydrochlorothiazide) .... Take 1/2 by mouth once daily 4)  Alprazolam 0.5 Mg Tbdp (Alprazolam) .... 1/2 - 1 by mouth two times a day as needed nerves 5)  Roxicet 5-325 Mg Tabs (Oxycodone-acetaminophen) 6)  Zofran 4 Mg Tabs (Ondansetron hcl) .Marland Kitchen.. 1 by mouth q 6 hrs as needed n/v 7)  Alendronate Sodium 70 Mg Tabs (Alendronate sodium) .Marland Kitchen.. 1 by mouth once daily 8)  Levaquin 500 Mg Tabs (Levofloxacin) .Marland Kitchen.. 1po once daily 9)  Relpax 40 Mg Tabs (Eletriptan hydrobromide) .Marland Kitchen.. 1po once daily as  needed  Other Orders: UA Dipstick W/ Micro (manual) ([address])  Patient Instructions: 1)  Please take all new medications as prescribed  - the le[facility]quin was [facility] to [facility], as well as the Relpax 2)  Please go to the Lab in the basement for your urine tests today  3)  Please call the number on the blue card for urine results 4)  you are given the Work Note today 5)  Please schedule a follow-up appointment in October 2011 for yearly exam with CPX labs Prescriptions: RELPAX 40 MG TABS (ELETRIPTAN HYDROBROMIDE) 1po once daily as needed  #10 x 11   Entered and Authorized by:   Corwin Levins MD   Signed by:   Corwin Levins MD on 04/11/2009   Method used:   Electronically to        [facility]* (retail)       [address]       [place], [place]  [id number]       Ph: [phone]       Fax: [phone]   RxID:   [id number] LEVAQUIN 500 MG TABS (LEVOFLOXACIN) 1po once daily  #10 x 0   Entered and Authorized by:   Corwin Levins MD   Signed by:   Corwin Levins MD on 04/11/2009   Method used:   Electronically to        [facility]* (retail)       [address]       [place], [place]  [id number]       Ph: [phone]       Fax: [phone]   RxID:   [id number]   Laboratory Results   Urine  Tests    Routine Urinalysis   Color: yellow Appearance: Clear Glucose: negative   (Normal Range: Negative) Bilirubin: negative   (Normal Range: Negative) Ketone: negative   (Normal Range: Negative) Spec. Gravity: 1.015   (Normal Range: 1.003-1.035) Blood: negative   (Normal Range: Negative) pH: 6.5   (Normal Range: 5.0-8.0) Protein: negative   (Normal Range: Negative) Urobilinogen: 0.2   (Normal Range: 0-1) Nitrite: negative   (Normal Range: Negative) Leukocyte Esterace: negative   (Normal Range: Negative)

## 2010-03-15 NOTE — Progress Notes (Signed)
Summary: Medication  Medications Added NEXIUM 40 MG CPDR (ESOMEPRAZOLE MAGNESIUM) Take 1 capsule 30 min prior to breakfast       Phone Note Call from Patient Call back at Home Phone [phone]   Caller: Patient Call For: Dr. Arlyce Dice Reason for Call: Refill Medication Summary of Call: [facility] a [facility] on [facility] Initial call taken by: Karna Christmas,  March 06, 2010 4:39 PM  Follow-up for Phone Call        Called pt to inform med sent Follow-up by: Merri Ray CMA Duncan Dull),  March 06, 2010 4:54 PM    New/Updated Medications: NEXIUM 40 MG CPDR (ESOMEPRAZOLE MAGNESIUM) Take 1 capsule 30 min prior to breakfast Prescriptions: NEXIUM 40 MG CPDR (ESOMEPRAZOLE MAGNESIUM) Take 1 capsule 30 min prior to breakfast  #30 x 11   Entered by:   Merri Ray CMA (AAMA)   Authorized by:   Louis Meckel MD   Signed by:   Merri Ray CMA (AAMA) on 03/06/2010   Method used:   Electronically to        [facility] (retail)       [address]       [place], [place]  [address]       Ph: [phone] or [phone]       Fax: [phone]   RxID:   [id number] NEXIUM 40 MG CPDR (ESOMEPRAZOLE MAGNESIUM) Take 1 capsule 30 min prior to breakfast  #30 x 11   Entered by:   Merri Ray CMA (AAMA)   Authorized by:   Louis Meckel MD   Signed by:   Merri Ray CMA (AAMA) on 03/06/2010   Method used:   Electronically to        [facility]* (retail)       [address]       [place], [place]  [address]       Ph: [phone]       Fax: [phone]   RxID:   [id number]  Sent to [facility] pharmacy in error

## 2010-05-12 ENCOUNTER — Other Ambulatory Visit: Payer: Self-pay | Admitting: Internal Medicine

## 2010-05-24 ENCOUNTER — Telehealth: Payer: Self-pay

## 2010-05-24 ENCOUNTER — Observation Stay (HOSPITAL_COMMUNITY)
Admission: EM | Admit: 2010-05-24 | Discharge: 2010-05-24 | Disposition: A | Discharge status: Home / Self Care | Payer: BC Managed Care – PPO | Attending: Emergency Medicine | Admitting: Emergency Medicine

## 2010-05-24 ENCOUNTER — Emergency Department (HOSPITAL_COMMUNITY): Payer: BC Managed Care – PPO

## 2010-05-24 DIAGNOSIS — R0609 Other forms of dyspnea: Secondary | ICD-10-CM | POA: Insufficient documentation

## 2010-05-24 DIAGNOSIS — R0989 Other specified symptoms and signs involving the circulatory and respiratory systems: Secondary | ICD-10-CM | POA: Insufficient documentation

## 2010-05-24 DIAGNOSIS — R110 Nausea: Secondary | ICD-10-CM | POA: Insufficient documentation

## 2010-05-24 DIAGNOSIS — Z79899 Other long term (current) drug therapy: Secondary | ICD-10-CM | POA: Insufficient documentation

## 2010-05-24 DIAGNOSIS — R0602 Shortness of breath: Principal | ICD-10-CM | POA: Insufficient documentation

## 2010-05-24 DIAGNOSIS — R002 Palpitations: Secondary | ICD-10-CM | POA: Insufficient documentation

## 2010-05-24 DIAGNOSIS — E119 Type 2 diabetes mellitus without complications: Secondary | ICD-10-CM | POA: Insufficient documentation

## 2010-05-24 DIAGNOSIS — E785 Hyperlipidemia, unspecified: Secondary | ICD-10-CM | POA: Insufficient documentation

## 2010-05-24 DIAGNOSIS — R209 Unspecified disturbances of skin sensation: Secondary | ICD-10-CM | POA: Insufficient documentation

## 2010-05-24 DIAGNOSIS — M79602 Pain in left arm: Secondary | ICD-10-CM

## 2010-05-24 DIAGNOSIS — Z86718 Personal history of other venous thrombosis and embolism: Secondary | ICD-10-CM | POA: Insufficient documentation

## 2010-05-24 DIAGNOSIS — I10 Essential (primary) hypertension: Secondary | ICD-10-CM | POA: Insufficient documentation

## 2010-05-24 LAB — URINALYSIS, ROUTINE W REFLEX MICROSCOPIC
Bilirubin Urine: NEGATIVE
Hgb urine dipstick: NEGATIVE
Ketones, ur: NEGATIVE mg/dL
Nitrite: NEGATIVE
Protein, ur: NEGATIVE mg/dL
Specific Gravity, Urine: 1.013 (ref 1.005–1.030)
Urobilinogen, UA: 0.2 mg/dL (ref 0.0–1.0)

## 2010-05-24 LAB — POCT CARDIAC MARKERS
CKMB, poc: <1 ng/mL — ABNORMAL LOW (ref 1.0–8.0)
CKMB, poc: <1 ng/mL — ABNORMAL LOW (ref 1.0–8.0)
Myoglobin, poc: 30.8 ng/mL (ref 12–200)
Myoglobin, poc: 34 ng/mL (ref 12–200)
Myoglobin, poc: 44.1 ng/mL (ref 12–200)
Troponin i, poc: <0.05 ng/mL (ref 0.00–0.09)

## 2010-05-24 LAB — POCT I-STAT, CHEM 8
Calcium, Ion: 1.12 mmol/L (ref 1.12–1.32)
Chloride: 105 mEq/L (ref 96–112)
Glucose, Bld: 100 mg/dL — ABNORMAL HIGH (ref 70–99)
HCT: 37 % (ref 36.0–46.0)
Hemoglobin: 12.6 g/dL (ref 12.0–15.0)
TCO2: 27 mmol/L (ref 0–100)

## 2010-05-24 LAB — BASIC METABOLIC PANEL
BUN: 25 mg/dL — ABNORMAL HIGH (ref 6–23)
CO2: 28 mEq/L (ref 19–32)
Calcium: 9.3 mg/dL (ref 8.4–10.5)
Chloride: 104 mEq/L (ref 96–112)
Creatinine, Ser: 0.71 mg/dL (ref 0.4–1.2)
GFR calc Af Amer: >60 mL/min (ref >60)
GFR calc non Af Amer: >60 mL/min (ref >60)
Glucose, Bld: 103 mg/dL — ABNORMAL HIGH (ref 70–99)
Potassium: 4.4 mEq/L (ref 3.5–5.1)
Sodium: 139 mEq/L (ref 135–145)

## 2010-05-24 LAB — DIFFERENTIAL
Basophils Absolute: 0 10*3/uL (ref 0.0–0.1)
Basophils Relative: 1 % (ref 0–1)
Eosinophils Absolute: 0.1 10*3/uL (ref 0.0–0.7)
Eosinophils Relative: 3 % (ref 0–5)
Lymphocytes Relative: 34 % (ref 12–46)
Lymphs Abs: 1.8 10*3/uL (ref 0.7–4.0)
Monocytes Absolute: 0.6 10*3/uL (ref 0.1–1.0)
Monocytes Relative: 11 % (ref 3–12)
Neutro Abs: 2.7 10*3/uL (ref 1.7–7.7)
Neutrophils Relative %: 52 % (ref 43–77)

## 2010-05-24 LAB — CBC
HCT: 36.8 % (ref 36.0–46.0)
Hemoglobin: 12.4 g/dL (ref 12.0–15.0)
MCH: 30.6 pg (ref 26.0–34.0)
MCHC: 33.7 g/dL (ref 30.0–36.0)
MCV: 90.9 fL (ref 78.0–100.0)
Platelets: 148 10*3/uL — ABNORMAL LOW (ref 150–400)
RBC: 4.05 MIL/uL (ref 3.87–5.11)
RDW: 12.1 % (ref 11.5–15.5)
WBC: 5.2 10*3/uL (ref 4.0–10.5)

## 2010-05-24 LAB — POCT PREGNANCY, URINE: Preg Test, Ur: NEGATIVE

## 2010-05-24 NOTE — Telephone Encounter (Signed)
Pt called stating she was having palpitations and requested an appt with JWJ. Pt was transferred to me and after questioning pt she stated she had an abnormal EKG done at her job yesterday and some moderate nausea last night with numbness of her left arm and palpitations this morning. I urged pt to go to the ER ASAP but she declined stating it would be too expensive. Pt also said she worked very close to the ER and would go if she felt the need but would like a referral to [facility] in the meantime. I advised pt that it may be a few weeks before she is seen at [facility] but pt said she still wanted referral in case she did not go to ER.

## 2010-05-24 NOTE — Telephone Encounter (Signed)
Referral done

## 2010-06-02 ENCOUNTER — Encounter: Payer: Self-pay | Admitting: Cardiovascular Disease

## 2010-06-02 ENCOUNTER — Ambulatory Visit: Payer: Self-pay | Admitting: Cardiovascular Disease

## 2010-06-06 ENCOUNTER — Encounter: Payer: Self-pay | Admitting: Cardiovascular Disease

## 2010-06-06 ENCOUNTER — Ambulatory Visit (INDEPENDENT_AMBULATORY_CARE_PROVIDER_SITE_OTHER): Payer: BC Managed Care – PPO | Admitting: Cardiovascular Disease

## 2010-06-06 DIAGNOSIS — IMO0001 Reserved for inherently not codable concepts without codable children: Secondary | ICD-10-CM

## 2010-06-06 DIAGNOSIS — R002 Palpitations: Secondary | ICD-10-CM

## 2010-06-06 DIAGNOSIS — Q899 Congenital malformation, unspecified: Secondary | ICD-10-CM

## 2010-06-06 DIAGNOSIS — R079 Chest pain, unspecified: Secondary | ICD-10-CM | POA: Insufficient documentation

## 2010-06-06 MED ORDER — PROPRANOLOL HCL 10 MG PO TABS: ORAL_TABLET | ORAL | Status: DC

## 2010-06-06 NOTE — Assessment & Plan Note (Signed)
Event monitor, cardiac MRI R/O RV dysplasia.  PRN inderal called in .  F/U post test for further evalauation

## 2010-06-06 NOTE — Assessment & Plan Note (Signed)
Atypical normal stress echo no need for further w/u

## 2010-06-06 NOTE — Progress Notes (Signed)
50 yo referred by ER  Recent eval for SSCP, dyspnea and palpitations.  History of panic attacks.  Reviewed records form ER.  R/O negative w/u including normal stress echo.  Last few weeks has had increasing palpitations.  Describes more stress at work.  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  Occasional SSCP with stress and dyspnea.  No arrhythmia seen on tele while in ER.  TSH has been in good range with history of hypothyroidism  Not anemic.  History of migraines that are under control.  No history of CAD, valvular disease , prolapse.  Denies drug or excessive alcohol use  ROS: Denies fever, malais, weight loss, blurry vision, decreased visual acuity, cough, sputum, SOB, hemoptysis, pleuritic pain, palpitaitons, heartburn, abdominal pain, melena, lower extremity edema, claudication, or rash.  Reviewed stress echo 05/24/10  Normal RV and LV function   General: Affect appropriate Healthy:  appears stated age HEENT: normal Neck supple with no adenopathy JVP normal no bruits no thyromegaly Lungs clear with no wheezing and good diaphragmatic motion Heart:  S1/S2 no murmur,rub, gallop or click PMI normal Abdomen: benighn, BS positve, no tenderness, no AAA no bruit.  No HSM or HJR Distal pulses intact with no bruits No edema Neuro non-focal Skin warm and dry No muscular weakness  Medications Current Outpatient Prescriptions  Medication Sig Dispense Refill  . ALPRAZolam (XANAX) 0.5 MG tablet Take 0.5 mg by mouth at bedtime as needed.        . Biotin 10 MG TABS Take by mouth.        . eletriptan (RELPAX) 40 MG tablet One tablet by mouth as needed for migraine headache.  If the headache improves and then returns, dose may be repeated after 2 hours have elapsed since first dose (do not exceed 80 mg per day). may repeat in 2 hours if necessary       . esomeprazole (NEXIUM) 40 MG capsule Take 40 mg by mouth daily before breakfast.        . fluconazole (DIFLUCAN) 100 MG tablet Take 100 mg by mouth daily  as needed.       Marland Kitchen ibuprofen (ADVIL,MOTRIN) 200 MG tablet Take 200 mg by mouth every 6 (six) hours as needed.        Marland Kitchen levothyroxine (SYNTHROID, LEVOTHROID) 50 MCG tablet Take 50 mcg by mouth daily.        . naproxen sodium (ANAPROX) 220 MG tablet Take 220 mg by mouth 2 (two) times daily with a meal.        . ondansetron (ZOFRAN) 4 MG tablet Take 4 mg by mouth every 8 (eight) hours as needed.        Marland Kitchen VALTREX 1 G tablet TAKE 1 TABLET TWICE DAILYFOR 5 DAYS AS NEEDED.  10 each  0  . propranolol (INDERAL) 10 MG tablet 1 DAILY AS NEEDED FOR PALPITATIONS.  30 tablet  2  . DISCONTD: aluminum hydroxide-magnesium carbonate (GAVISCON) 31.7-137 MG/5ML suspension Take by mouth every 6 (six) hours as needed.        Marland Kitchen DISCONTD: aluminum hydroxide-magnesium-alginic acid (GENATON 80-20) chewable tablet Chew 1 tablet by mouth 3 (three) times daily as needed.        Marland Kitchen DISCONTD: bismuth-metronidazole-tetracycline (PLYERA) 140-125-125 MG per capsule Take 3 capsules by mouth 4 (four) times daily -  before meals and at bedtime.        Marland Kitchen DISCONTD: lidocaine-hydrocortisone (ANAMANTLE HC) 3-0.5 % CREA Place 1 Applicatorful rectally.        Marland Kitchen  DISCONTD: oxyCODONE-acetaminophen (PERCOCET) 5-325 MG per tablet Take 1 tablet by mouth every 4 (four) hours as needed.        Marland Kitchen DISCONTD: simethicone (MYLICON) 80 MG chewable tablet Chew 80 mg by mouth every 6 (six) hours as needed.        Marland Kitchen DISCONTD: traMADol (ULTRAM) 50 MG tablet Take 50 mg by mouth every 6 (six) hours as needed.          Allergies Sulfonamide derivatives  Family History: No family history on file.  Social History: History   Social History  . Marital Status: Married    Spouse Name: N/A    Number of Children: N/A  . Years of Education: N/A   Occupational History  . cna    Social History Main Topics  . Smoking status: Former Games developer  . Smokeless tobacco: Not on file   Comment: in college  . Alcohol Use: No  . Drug Use: No  . Sexually Active: Not  on file   Other Topics Concern  . Not on file   Social History Narrative  . No narrative on file    Electrocardiogram:  NSR  RBBB 05/24/10  Assessment and Plan

## 2010-06-06 NOTE — Patient Instructions (Addendum)
Your physician recommends that you schedule a follow-up appointment in: 1 MONTH WITH DR [facility]  Your physician has recommended that you wear an event monitor. Event monitors are medical devices that record the heart's electrical activity. Doctors most often [place] these monitors to diagnose arrhythmias. Arrhythmias are problems with the speed or rhythm of the heartbeat. The monitor is a small, portable device. You can wear one while you do your normal daily activities. This is usually used to diagnose what is causing palpitations/syncope (passing out). 24 DAY FOR PALPITATIONS  Your physician has requested that you have a cardiac MRI. Cardiac MRI uses a computer to create images of your heart as its beating, producing both still and moving pictures of your heart and major blood vessels. For further information please visit [email or web address]. Please follow the instruction sheet given to you today for more information.  AV DYSPLASIA  PVC'S  TAKE PROPRANOLOL 10 MG DAILY AS NEEDED FOR PALPITATIONS.

## 2010-06-07 ENCOUNTER — Encounter (INDEPENDENT_AMBULATORY_CARE_PROVIDER_SITE_OTHER): Payer: BC Managed Care – PPO

## 2010-06-07 DIAGNOSIS — R002 Palpitations: Secondary | ICD-10-CM

## 2010-06-15 ENCOUNTER — Encounter: Payer: Self-pay | Admitting: Cardiovascular Disease

## 2010-06-23 ENCOUNTER — Telehealth: Payer: Self-pay | Admitting: Cardiovascular Disease

## 2010-06-23 NOTE — Discharge Summary (Signed)
[facility]  Patient:    [name], [name]                   MRN: [id number] Adm. Date:  [id number] Disc. Date: [id number] Attending:  Collene Schlichter                           Discharge Summary  HISTORY OF PRESENT ILLNESS:  The patient is a 50 year old with abnormal uterine bleeding, pelvic pain, history of endometriosis, and ovarian cyst, who was admitted on August 15, 2000, for laparoscopy, vaginal hysterectomy, possible transabdominal hysterectomy, and possible bilateral salpingo-oophorectomy. The remainder of her history and physical are as previously dictated.  LABORATORY DATA:  Negative serum pregnancy test.  Hemoglobin 12 g.  The electrocardiogram showed sinus bradycardia and an incomplete regular rate and rhythm.  HOSPITAL COURSE:  The patient was taken to the operating room on August 15, 2000, at which time laparoscopy, vaginal hysterectomy, and ablation of endometriosis were performed.  The patient did well postoperatively.  Diet and ambulation were progressed over the evening of August 15, 2000, and early morning of August 16, 2000.  On the morning of August 16, 2000, the patient was ambulatory, voiding without difficulty, and experiencing no problems.  It was felt that she could be discharged at this time.  FINAL DIAGNOSES: 1. History of endometriosis. 2. Pelvic pain. 3. Abnormal uterine bleeding. 4. Ovarian cyst.  OPERATIONS: 1. Laparoscopy. 2. Vaginal hysterectomy. 3. Ablation of endometriosis.  LABORATORY DATA:  The pathology is not available at time of dictation.  DISPOSITION:  Discharged home.  FOLLOW-UP:  To return to the office in approximately two weeks for follow-up.  ACTIVITY:  She was instructed to gradually progress her activities over several weeks at home and to limit lifting and driving for two weeks.  She was fully ambulatory.  DIET:  Regular.  CONDITION ON DISCHARGE:  Good.  DISCHARGE MEDICATIONS:  She was given  a prescription for [facility], #30, to be taken one or two q.4h. p.r.n. pain or if this was ineffective in controlling her pain, a prescription for Dilaudid 2 mg, #30, to be taken one or two q.4h. p.r.n. pain.  She was also given a prescription for doxycycline 100 mg, #12, to be taken one b.i.d.  She was also advised to use stool softeners and ibuprofen as an anti-inflammatory medication for the next week. She is to call for any problems. DD:  08/16/00 TD:  08/17/00 Job: [id number]

## 2010-06-23 NOTE — H&P (Signed)
[facility]  Patient:    [name], [name]                     MRN: [id number] Adm. Date:  08/15/00 Attending:  Almedia Balls. [name], M.D.                         History and Physical  CHIEF COMPLAINT:  Pain, abnormal bleeding, uterine enlargement, probable myomata.  HISTORY OF PRESENT ILLNESS:  Patient is a 50 year old gravida 1, para 1, whose last menstrual period was July 23, 2000.  She has been followed in our office over several years.  She has had gradually increasing severity of menses in that they are more frequent andheavier with severe pain.  Because of the abnormal bleeding, she underwent hysteroscopy, D&C, in February 2002, with findings of benign endometrium and endocervix but with much increased tissue and finding of submucousmyomata.  Pap smear in November 2001 was within normal limits.  The patients problems have increased, and she has had a decrease in normal hemoglobin of 13.5 g down around 12 g because of heavy flow.  She also experiences severe pain requiring numerous analgesics with her menses.  She is admitted at this time for laparoscopically assisted vaginal hysterectomy, possible transabdominal hysterectomy, possible bilateral salpingo-oophorectomy.  She has been fully counseled as to the nature of the procedure andthe risks involved, to include risks of anesthesia injury to bowel, bladder, blood vessels, ureters, postoperative hemorrhage, infection, recuperation, and possible use of hormone replacement should her ovaries be removed.  She fully understands all these considerations and wishes to proceed on August 15, 2000.  Laparoscopy done in February 2002 also revealed endometriosis with a moderate extent.  PAST MEDICAL HISTORY:  Past medical history includes the above-noted surgery.  ALLERGIES:  SULFA MEDICATIONS.  MEDICATIONS:  Has taken hormonal suppression agents in the way of progesterone and some estrogen as well, as well as  numerous analgesics in an attempt to control her menses.  She is also on Synthroid for hypothyroidism 50 mcg daily.  FAMILY HISTORY:  Essentially noncontributory.  REVIEW OF SYSTEMS:  HEENT:  Negative.  CARDIORESPIRATORY:  Negative. GASTROINTESTINAL:  Negative.  GENITOURINARY:  As in present illness.  SOCIAL HISTORY:  She is a nonsmoker.  PHYSICAL EXAMINATION:  VITAL SIGNS:  Height 5 feet 7 inches, weight 137 pounds.  Blood pressure 108/50, pulse 72, respirations 18.  GENERAL:  A well-developed white female in no acute distress.  HEENT:  Within normal limits.  NECK:  Supple without masses, adenopathy, or bruits.  CHEST:  Lungs clear to P&A.  CARDIAC:  Regular rate and rhythm without murmurs.  BREASTS:  Breasts examined sitting and lying without mass.  Axillanegative.  ABDOMEN:  Flat and soft, without mass, nontender.  PELVIC:  External genitalia, Bartholins, urethra, and Skenes glands within normal limits.  Cervix slightly inflamed.  Uterus midposition, approximately eight to [redacted] weeks gestational size and slightly irregular.  There areno palpable adnexal masses, although there is some tenderness in the right side. Anterior and posterior cul-de-sac exam is confirmatory.  EXTREMITIES:  Within normal limits.  NEUROLOGIC:  Grossly intact.  SKIN:  Without suspicious lesions.  DISPOSITION:  Admit for theabove-noted surgery on August 15, 2000. DD:  08/12/00 TD:  08/12/00 Job: [id number]

## 2010-06-23 NOTE — Letter (Signed)
November 01, 2005     Almedia Balls. Fore, M.D.  [id number]  [place], [place] [address]   RE:  [name], [name]  MRN:  [id number]  /  DOB:  08-Jul-1960   Dear Dr. [name]:   Upon your kind referral, I had the pleasure of evaluating your patient and I  am pleased to offer my findings.  I saw [name] in the office  today.  Enclosed is a copy of my progress note that details my findings and  recommendations.   Thank you for the opportunity to participate in your patient's care.   Sincerely,      Barbette Hair. Arlyce Dice, MD,FACG    RDK/MedQ  DD:  11/01/2005  DT:  11/03/2005  Job #:  [id number]

## 2010-06-23 NOTE — Letter (Signed)
November 01, 2005     Cyndee Brightly  [address]  [place], [place]  [address]   RE:  JASLIN, NOVITSKI  MRN:  [id number]  /  DOB:  1960/03/29   Dear Ms. Lingard:   It is my pleasure to have treated you recently as a new patient in my  office.  I appreciate your confidence and the opportunity to participate in  your care.   Since I do have a busy inpatient endoscopy schedule and office schedule, my  office hours vary weekly.  I am, however, available for emergency calls  every day through my office.  If I cannot promptly meet an urgent office  appointment, another one of our gastroenterologists will be able to assist  you.   My well-trained staff are prepared to help you at all times.  For  emergencies after office hours, a physician from our gastroenterology  section is always available through my 24-hour answering service.   While you are under my care, I encourage discussion of your questions and  concerns, and I will be happy to return your calls as soon as I am  available.   Once again, I welcome you as a new patient and I look forward to a happy and  healthy relationship.   Sincerely,      Barbette Hair. Arlyce Dice, MD,FACG   RDK/MedQ DD:  11/01/2005 DT:  11/03/2005 Job #:  [id number]

## 2010-06-23 NOTE — Telephone Encounter (Signed)
Pt having cardiac mri - on  5/22 . Need something called in due to been  claustrophobic- please called in  [facility] [address]

## 2010-06-23 NOTE — Assessment & Plan Note (Signed)
[facility]                           GASTROENTEROLOGY OFFICE NOTE   [name], [name]                   MRN:          [id number]  DATE:11/01/2005                            DOB:          [birth year]    REASON FOR CONSULTATION:  Abdominal pain.   Ms. [name] is a pleasant 50 year old white female, referred through the  courtesies of Dr. Jonny Ruiz and Dr. [name] for evaluation.  For several months she  has been complaining of lower abdominal discomfort.  This is somewhat poorly  described.  She has aching and sometimes sharp pain both in the left and  right lower quadrants.  She describes urinary frequency with this.  She has  received several courses of antibiotics for a possible UTI, though cultures  apparently have been negative.  She suffers from chronic constipation.  She  takes fiber daily, though often has to strain at passing very hard stools.  Symptoms are not clearly improved with defecation nor worsened.  A recent CT  of the abdomen and pelvis did not demonstrate diverticulosis or  diverticulitis.  She has small ovarian cysts bilaterally, determined by Dr.  Delorse Lek exam.  There is no history of melena or hematochezia.   PAST MEDICAL HISTORY:  1. Pertinent for thyroid disease.  2. She suffers from chronic headaches.  3. She is status post hysterectomy.  4. She has had chronic low back pain.   FAMILY HISTORY:  Noncontributory.   MEDICATIONS:  Include Synthroid, Levaquin, penicillin, Vicodin and  ibuprofen.   ALLERGIES:  SULFA.   She neither smokes nor drinks.  She is married, and works as a Air cabin crew.   REVIEW OF SYSTEMS:  Positive for feet swelling, sleeping problems, night  sweats, back pain, anxiety, fatigue and muscle cramps.   PHYSICAL EXAMINATION:  Pulse 80, blood pressure 102/60, weight 135.  HEENT: EOMI. PERRLA. Sclerae are anicteric.  Conjunctivae are pink.  NECK:  Supple without thyromegaly, adenopathy or  carotid bruits.  CHEST:  Clear to auscultation and percussion without adventitious sounds.  CARDIAC:  Regular rhythm; normal S1 S2.  There are no murmurs, gallops or  rubs.  ABDOMEN:  She has mild, diffuse, minimal tenderness in the right lower  quadrant and periumbilical area.  There are no abdominal masses or  organomegaly.  There is no guarding or rebound.  EXTREMITIES:  Full range of motion.  No cyanosis, clubbing or edema.  RECTAL:  Deferred.   IMPRESSION:  Intermittent but persistent lower abdominal pain.  The symptoms  could be related to her constipation.  She may have irritable bowel  syndrome/constipation.  She does have some urinary symptoms, raising the  question of a bladder dysfunction including interstitial cystitis.  There is  no evidence for acute diverticulitis.   RECOMMENDATIONS:  1. Colonoscopy.  2. Begin lactulose 15 mL twice a day.  It will be helpful to determine      whether her symptoms subside once she is on a stable bowel regimen and      without constipation.  3. To consider urology consult if workup is negative and symptoms do not  improve.       Barbette Hair. Arlyce Dice, MD,FACG      RDK/MedQ  DD:  11/01/2005  DT:  11/03/2005  Job #:  [id number]   cc:   Almedia Balls. [name], M.D.

## 2010-06-23 NOTE — Op Note (Signed)
[facility]  Patient:    [name], [name]                   MRN: [id number] Proc. Date: 08/15/00 Adm. Date:  [id number] Attending:  Collene Schlichter CC:         Harl Bowie, M.D.   Operative Report  PREOPERATIVE DIAGNOSIS:  Abnormal uterine bleeding, pelvic pain, endometriosis, probable adenomyosis.  POSTOPERATIVE DIAGNOSIS:  Abnormal uterine bleeding, pelvic pain, endometriosis, probable adenomyosis, pending pathology.  OPERATION:  Laparoscopy with ablation of endometriosis, vaginal hysterectomy.  ANESTHESIA:  General orotracheal.  SURGEON:  Almedia Balls. [name], M.D.  FIRST ASSISTANT:  Harl Bowie, M.D.  INDICATIONS:  The patient is a 50 year old with the above noted problem.  She was counseled as to the need for surgery and the type of surgery to be performed to include risks of the anesthesia, injury to bowel, bladder, blood vessels, ureters, postoperative hemorrhage, infection, recuperation, and possible hormone replacement should her ovaries be removed.  She fully understands all these considerations and wishes to proceed on August 15, 2000.  OPERATIVE FINDINGS:  On laparoscopy, the lower liver edge, gallbladder, spleen, and area of the appendix were normal to visualization.  The uterus is mid posterior, approximately eight weeks gestational size, and quite soft. Right ovary has small powder burns present on the external surface, suggestive of endometriosis.  The left ovary had a simple cyst present.  There were no other suspicious areas in the pelvis.  DESCRIPTION OF PROCEDURE:  With the patient under general anesthesia, prepared and draped in the usual sterile fashion, with an empty bladder post catheterization, and a tenaculum on the cervix, an incision was made in the lower pole of the umbilicus.  The Veress cannula was inserted into the peritoneal cavity with insufflation of 3 L of carbon dioxide.  The reuseable 10 mm trocar for  the operative scope and the scope itself was then inserted into the peritoneal cavity.  Reuseable 5 mm probe was inserted through a stab wound just above the symphysis pubis.  The above noted findings were visualized without difficulty.  Bipolar electrocoagulation was used bilaterally to render the utero-ovarian anastomoses, tubes, and round ligaments hemostatic.  These structures were sequentially cut free.  A bladder flap was created on the anterior surface of the uterus with bipolar electrocoagulation and sharp dissection.  At this point with good hemostasis in the operative area, attention was directed to the vaginal portion of the procedure.  The patient was repositioned for a vaginal procedure.  A weighted speculum was placed in the posterior vagina, and the cervix was grasped with two Loman [place].  A solution of 1% lidocaine with 1:200,000 epinephrine was injected circumferentially on the cervix; a total of 10 ml was used.  An incision was made from the 3 to 9 oclock position anteriorly with development of a bladder flap, and entry into the anterior cul-de-sac without difficulty.  The posterior cul-de-sac was likewise entered, and Heaney clamps were then used to clamp in succession the uterosacral ligaments and vaginal cuff angles bilaterally, cardinal ligaments bilaterally, and uterine vessels bilaterally; these structures were individually cut free and individually suture ligated with 1 chromic catgut.  It was impossible to invert the uterus, and a small portion of the broad ligament on the left was then clamped, cut, and suture ligated with removal of the uterus and cervix as a single specimen.  The area was lavaged with copious amounts of lactated Ringers solution.  A  figure-of-eight suture was necessary in the vaginal cuff area on the right for a bleeder.  The peritoneum was then closed with a pursestring suture of #1 chromic catgut.  A modified McCall suture was then  placed for obliteration of the cul-de-sac and prevention of enterocele.  The vaginal cuff was reapproximated and rendered hemostatic with a continuous interlocking suture of #1 chromic catgut.  The patient was then catheterized with free flow of clear urine, and after changing gloves, laparoscopy was performed.  There was a small bleeder in the area of the left adnexal structures which was rendered hemostatic with bipolar electrocoagulation.  The area was lavaged with copious amounts of lactated Ringers solution, and after observing the area for several minutes after reducing intra-abdominal pressure by allowing gas to escape, and noting that hemostasis was maintained, and that sponge and instrument counts were correct, the instruments were removed from the peritoneal cavity.  The gas was allowed to fully escape, and the incisions were closed with fascial sutures of 0 Vicryl and subcuticular sutures of 3-0 plain catgut.  Estimated blood loss 150 ml.  The patient was taken to the recovery room in good condition following third catheterization with free flow of clear urine. She will be placed on 23 hour observation following surgery. DD:  08/15/00 TD:  08/15/00 Job: [id number]

## 2010-06-27 ENCOUNTER — Ambulatory Visit (HOSPITAL_COMMUNITY)
Admission: RE | Admit: 2010-06-27 | Discharge: 2010-06-27 | Disposition: A | Discharge status: Home / Self Care | Payer: BC Managed Care – PPO | Source: Ambulatory Visit | Attending: Cardiovascular Disease | Admitting: Cardiovascular Disease

## 2010-06-27 DIAGNOSIS — R002 Palpitations: Secondary | ICD-10-CM | POA: Insufficient documentation

## 2010-06-27 DIAGNOSIS — IMO0001 Reserved for inherently not codable concepts without codable children: Secondary | ICD-10-CM

## 2010-06-27 MED ORDER — GADOPENTETATE DIMEGLUMINE 469.01 MG/ML IV SOLN: 36.0000 mL | Freq: Once | INTRAVENOUS | Status: AC | PRN

## 2010-07-05 NOTE — Telephone Encounter (Signed)
PT ALREADY  HAD MRI DONE./CY

## 2010-07-18 ENCOUNTER — Ambulatory Visit (INDEPENDENT_AMBULATORY_CARE_PROVIDER_SITE_OTHER): Payer: BC Managed Care – PPO | Admitting: Cardiovascular Disease

## 2010-07-18 ENCOUNTER — Encounter: Payer: Self-pay | Admitting: Internal Medicine

## 2010-07-18 ENCOUNTER — Encounter: Payer: Self-pay | Admitting: Cardiovascular Disease

## 2010-07-18 ENCOUNTER — Ambulatory Visit: Payer: BC Managed Care – PPO | Admitting: Cardiovascular Disease

## 2010-07-18 VITALS — BP 111/71 | HR 61 | Resp 16 | Ht 67.0 in | Wt 127.0 lb

## 2010-07-18 DIAGNOSIS — Z0000 Encounter for general adult medical examination without abnormal findings: Secondary | ICD-10-CM | POA: Insufficient documentation

## 2010-07-18 DIAGNOSIS — F411 Generalized anxiety disorder: Secondary | ICD-10-CM

## 2010-07-18 DIAGNOSIS — R002 Palpitations: Secondary | ICD-10-CM

## 2010-07-18 DIAGNOSIS — Z0001 Encounter for general adult medical examination with abnormal findings: Secondary | ICD-10-CM | POA: Insufficient documentation

## 2010-07-18 DIAGNOSIS — E785 Hyperlipidemia, unspecified: Secondary | ICD-10-CM

## 2010-07-18 NOTE — Assessment & Plan Note (Signed)
Likely related to palpitations more than anything  F/U primary.  She asked for something to help her sleep. Since she was already using Xanax I suggested she talk to her primary

## 2010-07-18 NOTE — Assessment & Plan Note (Signed)
Cholesterol is at goal.  Continue current dose of statin and diet Rx.  No myalgias or side effects.  F/U  LFT's in 6 months. No results found for this basename: LDLCALC             

## 2010-07-18 NOTE — Patient Instructions (Signed)
Your physician recommends that you schedule a follow-up appointment as needed with Dr. Eden Emms.  You have been referred to see one of our Electrophysiologist for your palpitations.  Your doctor would like for you to have a signal average EKG at [facility].

## 2010-07-18 NOTE — Assessment & Plan Note (Signed)
No evidence of structural heart disease on echo or MRI.  Idioventricular rhythm on event monitor a little unusual in my experience.  SAECG and f/u EPS for further evaluation

## 2010-07-18 NOTE — Progress Notes (Signed)
50 yo referred by ER Recent eval for SSCP, dyspnea and palpitations. History of panic attacks. Reviewed records form ER. R/O negative w/u including normal stress echo. Last few weeks has had increasing palpitations. Describes more stress at work. 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 Occasional SSCP with stress and dyspnea. No arrhythmia seen on tele while in ER. TSH has been in good range with history of hypothyroidism Not anemic. History of migraines that are under control. No history of CAD, valvular disease , prolapse. Denies drug or excessive alcohol use  Still using xanax for anxiety and only used inderal a couple times at half dose  Makes her fatigued  ROS: Denies fever, malais, weight loss, blurry vision, decreased visual acuity, cough, sputum, SOB, hemoptysis, pleuritic pain, palpitaitons, heartburn, abdominal pain, melena, lower extremity edema, claudication, or rash.   Reviewed stress echo 05/24/10 Normal RV and LV function Reviewed cardiac MRI  06/27/10  Normal with no RV dysplasia and EF 52% Reviewed over 50 pages of Event monitor and NSR occasional PVC;s no significant arrhythmia  Did have short episodes of ? Idioventricular slower rhythm longest 4-12 beats.  Rate around 60   Because of this will do SAECG and have her see EPS  Discussed with Dr Graciela Husbands and showed him representative strips.  He agreed that this is a little unusual and not likely autonomic/vagally mediated.    ROS: Denies fever, malais, weight loss, blurry vision, decreased visual acuity, cough, sputum, SOB, hemoptysis, pleuritic pain, palpitaitons, heartburn, abdominal pain, melena, lower extremity edema, claudication, or rash.  All other systems reviewed and negative  General: Affect appropriate Healthy:  appears stated age HEENT: normal Neck supple with no adenopathy JVP normal no bruits no thyromegaly Lungs clear with no wheezing and good diaphragmatic motion Heart:  S1/S2 no murmur,rub, gallop or click PMI  normal Abdomen: benighn, BS positve, no tenderness, no AAA no bruit.  No HSM or HJR Distal pulses intact with no bruits No edema Neuro non-focal Skin warm and dry No muscular weakness   Current Outpatient Prescriptions  Medication Sig Dispense Refill  . ALPRAZolam (XANAX) 0.5 MG tablet Take 0.5 mg by mouth at bedtime as needed.        . eletriptan (RELPAX) 40 MG tablet One tablet by mouth as needed for migraine headache.  If the headache improves and then returns, dose may be repeated after 2 hours have elapsed since first dose (do not exceed 80 mg per day). may repeat in 2 hours if necessary       . esomeprazole (NEXIUM) 40 MG capsule Take 40 mg by mouth daily before breakfast.        . ibuprofen (ADVIL,MOTRIN) 200 MG tablet Take 200 mg by mouth every 6 (six) hours as needed.        Marland Kitchen levothyroxine (SYNTHROID, LEVOTHROID) 50 MCG tablet Take 50 mcg by mouth daily.        . naproxen sodium (ANAPROX) 220 MG tablet Take 220 mg by mouth 2 (two) times daily with a meal.        . ondansetron (ZOFRAN) 4 MG tablet Take 4 mg by mouth every 8 (eight) hours as needed.        . propranolol (INDERAL) 10 MG tablet 1 DAILY AS NEEDED FOR PALPITATIONS.  30 tablet  2  . VALTREX 1 G tablet TAKE 1 TABLET TWICE DAILYFOR 5 DAYS AS NEEDED.  10 each  0  . DISCONTD: Biotin 10 MG TABS Take by mouth.        Marland Kitchen  DISCONTD: fluconazole (DIFLUCAN) 100 MG tablet Take 100 mg by mouth daily as needed.         Allergies  Sulfonamide derivatives  Electrocardiogram:  Assessment and Plan

## 2010-07-20 ENCOUNTER — Telehealth: Payer: Self-pay | Admitting: *Deleted

## 2010-07-20 ENCOUNTER — Ambulatory Visit (INDEPENDENT_AMBULATORY_CARE_PROVIDER_SITE_OTHER): Payer: BC Managed Care – PPO | Admitting: Internal Medicine

## 2010-07-20 ENCOUNTER — Other Ambulatory Visit: Payer: BC Managed Care – PPO

## 2010-07-20 ENCOUNTER — Other Ambulatory Visit (INDEPENDENT_AMBULATORY_CARE_PROVIDER_SITE_OTHER): Payer: BC Managed Care – PPO

## 2010-07-20 ENCOUNTER — Encounter: Payer: Self-pay | Admitting: Internal Medicine

## 2010-07-20 VITALS — BP 98/62 | HR 53 | Temp 98.5°F | Ht 67.0 in | Wt 125.2 lb

## 2010-07-20 DIAGNOSIS — M810 Age-related osteoporosis without current pathological fracture: Secondary | ICD-10-CM

## 2010-07-20 DIAGNOSIS — E559 Vitamin D deficiency, unspecified: Secondary | ICD-10-CM

## 2010-07-20 DIAGNOSIS — D649 Anemia, unspecified: Secondary | ICD-10-CM

## 2010-07-20 DIAGNOSIS — Z0000 Encounter for general adult medical examination without abnormal findings: Secondary | ICD-10-CM

## 2010-07-20 DIAGNOSIS — M5090 Cervical disc disorder, unspecified, unspecified cervical region: Secondary | ICD-10-CM

## 2010-07-20 DIAGNOSIS — J029 Acute pharyngitis, unspecified: Secondary | ICD-10-CM

## 2010-07-20 DIAGNOSIS — E611 Iron deficiency: Secondary | ICD-10-CM

## 2010-07-20 DIAGNOSIS — M519 Unspecified thoracic, thoracolumbar and lumbosacral intervertebral disc disorder: Secondary | ICD-10-CM | POA: Insufficient documentation

## 2010-07-20 DIAGNOSIS — I493 Ventricular premature depolarization: Secondary | ICD-10-CM

## 2010-07-20 DIAGNOSIS — F411 Generalized anxiety disorder: Secondary | ICD-10-CM

## 2010-07-20 DIAGNOSIS — G4700 Insomnia, unspecified: Secondary | ICD-10-CM | POA: Insufficient documentation

## 2010-07-20 HISTORY — DX: Ventricular premature depolarization: I49.3

## 2010-07-20 HISTORY — DX: Cervical disc disorder, unspecified, unspecified cervical region: M50.90

## 2010-07-20 HISTORY — DX: Unspecified thoracic, thoracolumbar and lumbosacral intervertebral disc disorder: M51.9

## 2010-07-20 HISTORY — DX: Iron deficiency: E61.1

## 2010-07-20 LAB — BASIC METABOLIC PANEL
BUN: 20 mg/dL (ref 6–23)
CO2: 30 mEq/L (ref 19–32)
Calcium: 9.4 mg/dL (ref 8.4–10.5)
GFR: 84.09 mL/min (ref >60.00)
Glucose, Bld: 89 mg/dL (ref 70–99)

## 2010-07-20 LAB — CBC WITH DIFFERENTIAL/PLATELET
Basophils Absolute: 0 10*3/uL (ref 0.0–0.1)
Basophils Relative: 1 % (ref 0.0–3.0)
Eosinophils Absolute: 0.1 10*3/uL (ref 0.0–0.7)
Eosinophils Relative: 3.2 % (ref 0.0–5.0)
HCT: 38.3 % (ref 36.0–46.0)
Hemoglobin: 13.1 g/dL (ref 12.0–15.0)
Lymphocytes Relative: 45.1 % (ref 12.0–46.0)
Lymphs Abs: 1.5 10*3/uL (ref 0.7–4.0)
MCHC: 34.3 g/dL (ref 30.0–36.0)
MCV: 93.5 fl (ref 78.0–100.0)
Monocytes Absolute: 0.3 10*3/uL (ref 0.1–1.0)
Monocytes Relative: 9 % (ref 3.0–12.0)
Neutro Abs: 1.4 10*3/uL (ref 1.4–7.7)
Neutrophils Relative %: 41.7 % — ABNORMAL LOW (ref 43.0–77.0)
Platelets: 186 10*3/uL (ref 150.0–400.0)
RBC: 4.09 Mil/uL (ref 3.87–5.11)
RDW: 13.3 % (ref 11.5–14.6)
WBC: 3.4 10*3/uL — ABNORMAL LOW (ref 4.5–10.5)

## 2010-07-20 LAB — URINALYSIS
Bilirubin Urine: NEGATIVE
Hgb urine dipstick: NEGATIVE
Ketones, ur: NEGATIVE
Leukocytes, UA: NEGATIVE
Nitrite: NEGATIVE
Specific Gravity, Urine: 1.025
Total Protein, Urine: NEGATIVE
Urine Glucose: NEGATIVE
Urobilinogen, UA: 0.2
pH: 5.5 (ref 5.0–8.0)

## 2010-07-20 LAB — HEPATIC FUNCTION PANEL
ALT: 17 U/L (ref 0–35)
AST: 23 U/L (ref 0–37)
Albumin: 4.5 g/dL (ref 3.5–5.2)
Alkaline Phosphatase: 57 U/L (ref 39–117)
Bilirubin, Direct: 0.1 mg/dL (ref 0.0–0.3)
Total Bilirubin: 0.7 mg/dL (ref 0.3–1.2)
Total Protein: 7.4 g/dL (ref 6.0–8.3)

## 2010-07-20 LAB — TSH: TSH: 2.21 u[IU]/mL (ref 0.35–5.50)

## 2010-07-20 LAB — LIPID PANEL: VLDL: 8.4 mg/dL (ref 0.0–40.0)

## 2010-07-20 LAB — IBC PANEL: Transferrin: 268.9 mg/dL (ref 212.0–360.0)

## 2010-07-20 MED ORDER — FLUCONAZOLE 150 MG PO TABS: ORAL_TABLET | ORAL | Status: AC

## 2010-07-20 MED ORDER — ALPRAZOLAM 0.5 MG PO TABS: 0.5000 mg | ORAL_TABLET | Freq: Every day | ORAL | Status: DC | PRN

## 2010-07-20 MED ORDER — ZOLPIDEM TARTRATE ER 6.25 MG PO TBCR: 6.2500 mg | EXTENDED_RELEASE_TABLET | Freq: Every evening | ORAL | Status: DC | PRN

## 2010-07-20 MED ORDER — AZITHROMYCIN 250 MG PO TABS: ORAL_TABLET | ORAL | Status: AC

## 2010-07-20 NOTE — Assessment & Plan Note (Signed)
stable overall by hx and exam, most recent data reviewed with pt, and pt to continue medical treatment as before  For refill today

## 2010-07-20 NOTE — Patient Instructions (Addendum)
The nurse will call to addon Vit d, and iron panels today You should be contacted about the next bone density about October 2012 You should be contacted about the prolia copay in 1-2 wks Take all new medications as prescribed - antibix, sleep med and diflucan Continue all other medications as before - the xanax as needed The Remus Loffler can be increased in strength if  needed The nurse will check for relpax discount card Please return in 1 year for your yearly visit, or sooner if needed, with Lab testing done 3-5 days before

## 2010-07-20 NOTE — Assessment & Plan Note (Signed)
Last dxa c/w severe osteopenia with hx bilat foot fx - will look into prolia tx

## 2010-07-20 NOTE — Assessment & Plan Note (Signed)
Mild to mod, for antibx course,  to f/u any worsening symptoms or concerns 

## 2010-07-20 NOTE — Telephone Encounter (Signed)
Faxed ins verification request to [employer]. Will wait for benefit summary.

## 2010-07-21 LAB — VITAMIN D 25 HYDROXY (VIT D DEFICIENCY, FRACTURES): Vit D, 25-Hydroxy: 42 ng/mL (ref 30–89)

## 2010-07-21 NOTE — Progress Notes (Signed)
Quick Note:  Voice message left on [employer] system - lab is negative, normal or otherwise stable, pt to continue same tx ______ 

## 2010-07-25 NOTE — Telephone Encounter (Signed)
rec benefit summary. Pt's OOP is $25. Called pt to inform. Left mess for patient to call back.

## 2010-07-25 NOTE — Telephone Encounter (Signed)
Called pt and left detailed mess informing her of below. Advised her to call me back as to whether she wants to schedule nurse visit.

## 2010-07-30 ENCOUNTER — Encounter: Payer: Self-pay | Admitting: Internal Medicine

## 2010-07-30 NOTE — Assessment & Plan Note (Signed)

## 2010-07-30 NOTE — Assessment & Plan Note (Signed)
To also check vit d level to determine adequate replacement

## 2010-07-30 NOTE — Assessment & Plan Note (Signed)
Mild to mod, chronic recurrent, for ambien prn,  to f/u any worsening symptoms or concerns

## 2010-07-30 NOTE — Assessment & Plan Note (Signed)
To check iron level

## 2010-07-30 NOTE — Progress Notes (Signed)
Subjective:    Patient ID: [name], female    DOB: 10/01/1960, 50 y.o.   MRN: [id number]  HPI  Here for wellness and f/u;  Overall doing ok;  Pt denies CP, worsening SOB, DOE, wheezing, orthopnea, PND, worsening LE edema, palpitations, dizziness or syncope.  Pt denies neurological change such as new Headache, facial or extremity weakness.  Pt denies polydipsia, polyuria, or low sugar symptoms. Pt states overall good compliance with treatment and medications, good tolerability, and trying to follow lower cholesterol diet.  Pt denies worsening depressive symptoms, suicidal ideation or panic. No fever, wt loss, night sweats, loss of appetite, or other constitutional symptoms.  Pt states good ability with ADL's, low fall risk, home safety reviewed and adequate, no significant changes in hearing or vision, and occasionally active with exercise.     Also Here with 3 days acute onset fever, mod ST, pressure, general weakness and malaise, but little to no cough, no wheezing. Past Medical History  Diagnosis Date  . Hypothyroidism   . Coccyx sprain   . Anxiety and depression   . Allergic rhinitis   . IBS (irritable bowel syndrome)   . Hyperlipidemia   . Foot fracture, left     hx of  . Cyst of ovary     bilateral  . Headache   . Anemia     history of anemia secondary to fibroid sx  . Low back pain   . Lumbar disc disease 07/20/2010  . Cervical disc disease 07/20/2010  . Vitamin D deficiency 07/20/2010  . Iron deficiency 07/20/2010  . Osteoporosis 07/20/2010  . 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 Vaginal hysterectomy   . Right lateral epicondylar   . Fibroid sx   . Colonoscopy     reports that she has quit smoking. She does not have any smokeless tobacco history on file. She reports that she does not drink alcohol or use illicit drugs. family history includes Alcohol abuse in her father and mother and Heart attack in her father. Allergies  Allergen  Reactions  . Sulfonamide Derivatives    Current Outpatient Prescriptions on File Prior to Visit  Medication Sig Dispense Refill  . eletriptan (RELPAX) 40 MG tablet One tablet by mouth as needed for migraine headache.  If the headache improves and then returns, dose may be repeated after 2 hours have elapsed since first dose (do not exceed 80 mg per day). may repeat in 2 hours if necessary       . esomeprazole (NEXIUM) 40 MG capsule Take 40 mg by mouth daily before breakfast.        . ibuprofen (ADVIL,MOTRIN) 200 MG tablet Take 200 mg by mouth every 6 (six) hours as needed.        Marland Kitchen levothyroxine (SYNTHROID, LEVOTHROID) 50 MCG tablet Take 50 mcg by mouth daily.        Marland Kitchen lidocaine-hydrocortisone (ANAMANTLE HC) 3-0.5 % CREA Place 1 Applicatorful rectally.        . naproxen sodium (ANAPROX) 220 MG tablet Take 220 mg by mouth 2 (two) times daily with a meal.        . ondansetron (ZOFRAN) 4 MG tablet Take 4 mg by mouth every 8 (eight) hours as needed.        . propranolol (INDERAL) 10 MG tablet 1 DAILY AS NEEDED FOR PALPITATIONS.  30 tablet  2  . VALTREX 1 G tablet TAKE 1 TABLET TWICE DAILYFOR 5 DAYS AS  NEEDED.  10 each  0   Review of Systems Review of Systems  Constitutional: Negative for diaphoresis, activity change, appetite change and unexpected weight change.  HENT: Negative for hearing loss, ear pain, facial swelling, mouth sores and neck stiffness.   Eyes: Negative for pain, redness and visual disturbance.  Respiratory: Negative for shortness of breath and wheezing.   Cardiovascular: Negative for chest pain and palpitations.  Gastrointestinal: Negative for diarrhea, blood in stool, abdominal distention and rectal pain.  Genitourinary: Negative for hematuria, flank pain and decreased urine volume.  Musculoskeletal: Negative for myalgias and joint swelling.  Skin: Negative for color change and wound.  Neurological: Negative for syncope and numbness.  Hematological: Negative for adenopathy.    Psychiatric/Behavioral: Negative for hallucinations, self-injury, decreased concentration and agitation.      Objective:   Physical Exam BP 98/62  Pulse 53  Temp(Src) 98.5 F (36.9 C) (Oral)  Ht 5\' 7"  (1.702 m)  Wt 125 lb 4 oz (56.813 kg)  BMI 19.62 kg/m2  SpO2 95% Physical Exam  VS noted, mild ill apperaing Constitutional: Pt is oriented to person, place, and time. Appears well-developed and well-nourished.  HENT:  Head: Normocephalic and atraumatic.  Right Ear: External ear normal.  Left Ear: External ear normal.  Nose: Nose normal.  Mouth/Throat: Oropharynx is clear and moist. Bilat tm's mild erythema.  Sinus nontender.  Pharynx mild erythema  Eyes: Conjunctivae and EOM are normal. Pupils are equal, round, and reactive to light.  Neck: Normal range of motion. Neck supple. No JVD present. No tracheal deviation present.  Cardiovascular: Normal rate, regular rhythm, normal heart sounds and intact distal pulses.   Pulmonary/Chest: Effort normal and breath sounds normal.  Abdominal: Soft. Bowel sounds are normal. There is no tenderness.  Musculoskeletal: Normal range of motion. Exhibits no edema.  Lymphadenopathy:  Has no cervical adenopathy.  Neurological: Pt is alert and oriented to person, place, and time. Pt has normal reflexes. No cranial nerve deficit.  Skin: Skin is warm and dry. No rash noted.  Psychiatric:  Has  normal mood and affect. Behavior is normal. 1+ nervous        Assessment & Plan:

## 2010-08-04 ENCOUNTER — Other Ambulatory Visit (HOSPITAL_COMMUNITY): Payer: BC Managed Care – PPO

## 2010-08-10 ENCOUNTER — Telehealth: Payer: Self-pay

## 2010-08-10 MED ORDER — TRAZODONE HCL 50 MG PO TABS: 50.0000 mg | ORAL_TABLET | Freq: Every day | ORAL | Status: DC

## 2010-08-10 NOTE — Telephone Encounter (Signed)
Done hardcopy to dahlia/LIM B  

## 2010-08-10 NOTE — Telephone Encounter (Signed)
Pt called requesting alternate therapy for insomnia. Pt says that Ambien helps but she still wakes at 3-4 am, regardless of when she take medication. Pt also notes that she was advised by a Nurse that she has a 30% chance of developing cancer with Ambien use. Pt is requesting Trazodone as this was discussed at last OV, please advise.

## 2010-08-11 NOTE — Telephone Encounter (Signed)
Rx faxed to pharmacy  

## 2010-08-16 ENCOUNTER — Encounter: Payer: Self-pay | Admitting: Internal Medicine

## 2010-08-16 ENCOUNTER — Ambulatory Visit (INDEPENDENT_AMBULATORY_CARE_PROVIDER_SITE_OTHER): Payer: BC Managed Care – PPO | Admitting: Internal Medicine

## 2010-08-16 VITALS — BP 106/68 | HR 77 | Ht 67.5 in | Wt 128.0 lb

## 2010-08-16 DIAGNOSIS — I4949 Other premature depolarization: Secondary | ICD-10-CM

## 2010-08-16 DIAGNOSIS — I493 Ventricular premature depolarization: Secondary | ICD-10-CM

## 2010-08-16 NOTE — Patient Instructions (Signed)
Your physician recommends that you schedule a follow-up appointment in 3 months with Dr Allred    

## 2010-08-17 ENCOUNTER — Encounter: Payer: Self-pay | Admitting: Internal Medicine

## 2010-08-17 NOTE — Progress Notes (Signed)
[name] is a pleasant 50 y.o. WF patient with no significant PMH who presents on referral from Dr Eden Emms for further evaluation of PVCs and idioventricular rhythm.  The patient reports initially having palpitations in May.  She reports significant family stress, particularly with the wedding of her daughter at that time. She also feels that she had significant work stress. She presented to the ER with symptoms of chest pain and dypsnea.  She was subsequently referred to Dr Eden Emms.   She had an echo which revealed normal RV/LV function.  She also had a cardiac MRI which was normal without evidence of RV dysplasia.  An event monitor was placed which captured PVCs as well as short runs of an idioventricular rhythm.  She is now referred for further evaluation. She presently states that her palpitations have resolved.  She states that her life stress has improved.  She reports that she has had no symptomatic PVCs since last being seen by Dr Eden Emms.  She has been given propranolol to take as needed but has not required this.  She reports taking xanax for stress and finds this to be quite helpful.  Today, she denies symptoms of  chest pain, shortness of breath, orthopnea, PND, lower extremity edema, dizziness, presyncope, syncope, or neurologic sequela. She is clear that she has never had syncope.  The patient is tolerating medications without difficulties and is otherwise without complaint today.   Past Medical History  Diagnosis Date  . Hypothyroidism   . Anxiety and depression   . Allergic rhinitis   . IBS (irritable bowel syndrome)   . Hyperlipidemia   . Foot fracture, left     hx of  . Cyst of ovary     bilateral  . Anemia     history of anemia secondary to fibroid sx  . Low back pain   . Lumbar disc disease 07/20/2010  . Cervical disc disease 07/20/2010  . Vitamin D deficiency 07/20/2010  . Iron deficiency 07/20/2010  . Osteoporosis 07/20/2010  . 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 Vaginal hysterectomy   . Right lateral epicondylar   . Fibroid sx   . Colonoscopy     Current Outpatient Prescriptions  Medication Sig Dispense Refill  . ALPRAZolam (XANAX) 0.5 MG tablet Take 1 tablet (0.5 mg total) by mouth daily as needed.  30 tablet  2  . eletriptan (RELPAX) 40 MG tablet One tablet by mouth as needed for migraine headache.  If the headache improves and then returns, dose may be repeated after 2 hours have elapsed since first dose (do not exceed 80 mg per day). may repeat in 2 hours if necessary       . esomeprazole (NEXIUM) 40 MG capsule Take 40 mg by mouth daily before breakfast.        . ibuprofen (ADVIL,MOTRIN) 200 MG tablet Take 200 mg by mouth every 6 (six) hours as needed.        Marland Kitchen levothyroxine (SYNTHROID, LEVOTHROID) 50 MCG tablet Take 50 mcg by mouth daily.        . naproxen sodium (ANAPROX) 220 MG tablet Take 220 mg by mouth 2 (two) times daily with a meal.        . ondansetron (ZOFRAN) 4 MG tablet Take 4 mg by mouth every 8 (eight) hours as needed.        . propranolol (INDERAL) 10 MG tablet 1 DAILY AS NEEDED FOR PALPITATIONS.  30 tablet  2  . VALTREX 1 G tablet TAKE 1 TABLET TWICE DAILYFOR 5 DAYS AS NEEDED.  10 each  0    Allergies  Allergen Reactions  . Sulfonamide Derivatives     History   Social History  . Marital Status: Married    Spouse Name: N/A    Number of Children: N/A  . Years of Education: N/A   Occupational History  . cna    Social History Main Topics  . Smoking status: Former Games developer  . Smokeless tobacco: Not on file   Comment: quit 30 years ago  . Alcohol Use: No  . Drug Use: No  . Sexually Active: Not on file   Other Topics Concern  . 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    Family History  Problem Relation Age of Onset  . Alcohol abuse Mother   . Alcohol abuse Father     . Heart attack Father     died suddenly at home with presumed MI    ROS- All systems are reviewed and negative except as per the HPI above  Physical Exam: Filed Vitals:   08/16/10 1445  BP: 106/68  Pulse: 77  Height: 5' 7.5" (1.715 m)  Weight: 128 lb (58.06 kg)    GEN- The patient is well appearing, alert and oriented x 3 today.   Head- normocephalic, atraumatic Eyes-  Sclera clear, conjunctiva pink Ears- hearing intact Oropharynx- clear Neck- supple, no JVP Lymph- no cervical lymphadenopathy Lungs- Clear to ausculation bilaterally, normal work of breathing Heart- Regular rate and rhythm, no murmurs, rubs or gallops, PMI not laterally displaced GI- soft, NT, ND, + BS Extremities- no clubbing, cyanosis, or edema MS- no significant deformity or atrophy Skin- no rash or lesion Psych- euthymic mood, full affect Neuro- strength and sensation are intact  EKG today reveals sinus rhythm 77 bpm, RBBB, QTC 482   Reviewed stress echo 05/24/10 Normal RV and LV function  Reviewed cardiac MRI 06/27/10 Normal with no RV dysplasia and EF 52% Reviewed Event monitor and NSR occasional PVC;s  Short episodes of idioventricular rhythm were also observed.  Assessment and Plan:

## 2010-08-17 NOTE — Assessment & Plan Note (Addendum)
The patient presents today for EP evaluation regarding her PVCs and idioventricular rhythm which were observed on event monitor placed to evaluate palpitations.  I have reviewed her event monitor which reveals PVCs not closely coupled to preceeding T waves and short episodes of ideoventricular rhythm.  No other arrhythmias were observed.  She has a RBBB on ekg but no other abnormalities.  Her Echo/ MRI reveals no evidence of structural heart disease.  She has not had her SAEKG, though in the setting of RBBB, I am sure that it would be abnormal and am not sure how this would change our present management.  She does not meet criteria for ARVD and her MRI appears to be without any evidence of arrhythmogenic substrate. The patient's father did die suddenly and this is concerning.  She denies any other family history of sudden death and also denies a personal history of syncope.  Presently, her palpitations have completely resolved.  I therefore think that at this time, we will follow her conservatively.  If her palpitations return, she should take a beta blocker daily.  She is reluctant to take beta blockers daily at this time.  She will contact my office if her PVCs worse or any other problems arise.  I will see her again for further EP evaluation in 3 months.

## 2010-08-25 ENCOUNTER — Ambulatory Visit
Admission: RE | Admit: 2010-08-25 | Discharge: 2010-08-25 | Disposition: A | Discharge status: Home / Self Care | Payer: BC Managed Care – PPO | Source: Ambulatory Visit | Attending: Chiropractic Medicine | Admitting: Chiropractic Medicine

## 2010-08-25 ENCOUNTER — Other Ambulatory Visit: Payer: Self-pay | Admitting: Chiropractic Medicine

## 2010-08-25 DIAGNOSIS — M542 Cervicalgia: Secondary | ICD-10-CM

## 2010-10-14 ENCOUNTER — Other Ambulatory Visit: Payer: Self-pay | Admitting: Internal Medicine

## 2010-10-16 ENCOUNTER — Other Ambulatory Visit: Payer: Self-pay | Admitting: Internal Medicine

## 2010-10-16 ENCOUNTER — Other Ambulatory Visit: Payer: Self-pay

## 2010-10-16 MED ORDER — FLUCONAZOLE 150 MG PO TABS: ORAL_TABLET | ORAL | Status: AC

## 2010-10-16 NOTE — Telephone Encounter (Signed)
Done per emr 

## 2010-10-16 NOTE — Telephone Encounter (Signed)
Pt called requesting Rx for Diflucan. Pt states that she gets a yeast infection every time she takes an ABX and she discussed this with MD at last OV. Pt says MD would add medication to med list for prn refills. Please advise, pt is requesting a refill of this medication.   Pt is requesting a call back.

## 2010-10-16 NOTE — Telephone Encounter (Signed)
Patient informed. 

## 2010-11-17 LAB — URINALYSIS, ROUTINE W REFLEX MICROSCOPIC
Bilirubin Urine: NEGATIVE
Nitrite: NEGATIVE
Protein, ur: NEGATIVE
Specific Gravity, Urine: 1.019
Urobilinogen, UA: 0.2
Urobilinogen, UA: 0.2

## 2010-11-17 LAB — DIFFERENTIAL
Basophils Relative: 0
Eosinophils Absolute: 0.1
Monocytes Absolute: 0.7
Monocytes Relative: 6

## 2010-11-17 LAB — URINE MICROSCOPIC-ADD ON

## 2010-11-17 LAB — CBC
Hemoglobin: 11.3 — ABNORMAL LOW
MCHC: 35.1
MCV: 95.4
RBC: 3.38 — ABNORMAL LOW

## 2010-11-17 LAB — PREGNANCY, URINE: Preg Test, Ur: NEGATIVE

## 2010-12-04 ENCOUNTER — Telehealth: Payer: Self-pay | Admitting: Internal Medicine

## 2010-12-04 NOTE — Telephone Encounter (Signed)
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

## 2010-12-05 NOTE — Telephone Encounter (Signed)
Changed to 11/05 at 4:30

## 2010-12-06 ENCOUNTER — Ambulatory Visit: Payer: BC Managed Care – PPO | Admitting: Internal Medicine

## 2010-12-07 ENCOUNTER — Telehealth: Payer: Self-pay | Admitting: Internal Medicine

## 2010-12-07 NOTE — Telephone Encounter (Signed)
Left a message to patient by Teddy Spike Rn to take Inderal 5 mg daily until Monday when she has an appointment with Dr. Johney Frame, or call if she wants to be seen by the PA , we will see what openings he has.

## 2010-12-07 NOTE — Telephone Encounter (Signed)
Return patient's call, she said she continues having PVC'S after taken a total of 20 mg Inderal today. Patient also added that early today she was having chest pain she took 2/ 81 mg baby aspirin with relief . Dr. Ladona Ridgel DOD aware he call to speak with patient and left a message.  Later on Teddy Spike RN spoke  with patient extensibly and gave her some  Recommendations. Patient said she had taken Maalox for indigestion with relief. Patient will wait to  come in to be seen on Monday with Dr. Johney Frame for  her scheduled appointment.

## 2010-12-07 NOTE — Telephone Encounter (Signed)
Follow up:  Pt has called back stating her medication is not working.  Had EKG at her office and shows PVCs  She was told she would be seen today.  Please call her back.

## 2010-12-07 NOTE — Telephone Encounter (Signed)
Patient is having PVC's all the time since yesterday some SOB and some pain in her throat due to the PVC's. Patient took Inderal 10 mg,  2 tablets last night, 1/2 tablet today and still having the pvcs. Pt has an appointment with Dr. Johney Frame on 12/11/10, but would like to be seen today in the afternoon. She has an EKG that was done this week,  she will bring it in.

## 2010-12-07 NOTE — Telephone Encounter (Signed)
Pt was moved to 11-5, and now thinks she's having pvc's, pls call wants to see allred today, told her he's not here, wants to see someone and needs after 330p due to work and she was out yesterday due to her back,  pls Call [phone]

## 2010-12-07 NOTE — Telephone Encounter (Signed)
Pt called back please call

## 2010-12-11 ENCOUNTER — Ambulatory Visit (INDEPENDENT_AMBULATORY_CARE_PROVIDER_SITE_OTHER): Payer: BC Managed Care – PPO | Admitting: Internal Medicine

## 2010-12-11 VITALS — BP 117/67 | HR 55 | Ht 67.0 in | Wt 129.0 lb

## 2010-12-11 DIAGNOSIS — I4949 Other premature depolarization: Secondary | ICD-10-CM

## 2010-12-11 DIAGNOSIS — I493 Ventricular premature depolarization: Secondary | ICD-10-CM

## 2010-12-11 MED ORDER — NADOLOL 20 MG PO TABS: ORAL_TABLET | ORAL | Status: DC

## 2010-12-11 NOTE — Assessment & Plan Note (Signed)
The patient presents today for EP follow-up regarding her PVCs.  She has a RBBB on ekg but no other abnormalities.  Her Echo/ MRI reveals no evidence of structural heart disease.  She has no PVCs in the office today despite watching for a prolonged period on rhythm strip.  I could not induce PVCs with maneuvers.  At this time, I will stop inderol and start nadolol 10mg  daily.  This can be increased to 20mg  if necessary.  As her PVCs only occur intermittently, I think that catheter ablation could prove difficult.  I therefore think that we should continue medical therapy at this time.  She will contact my office if her PVCs worse or any other problems arise.  At her request, I will see her on an as needed basis.

## 2010-12-11 NOTE — Progress Notes (Signed)
The patient presents today for routine electrophysiology followup.  Since last being seen in our clinic, the patient reports doing very well.  She has noticed intermittent PVCs over the past 3 weeks.  She is unaware of any specific triggers or precipitants for these. She denies associated presyncope or syncope.  Palpitations are 1-2 "skipped beats" every few minutes.  She states that she has "good days and bad days".  Her inderol as needed has not been helpful.  Today, she denies symptoms of chest pain, shortness of breath, orthopnea, PND, lower extremity edema, dizziness, presyncope, syncope, or neurologic sequela.  The patient feels that she is tolerating medications without difficulties and is otherwise without complaint today.   Past Medical History  Diagnosis Date  . Hypothyroidism   . Anxiety and depression   . Allergic rhinitis   . IBS (irritable bowel syndrome)   . Hyperlipidemia   . Foot fracture, left     hx of  . Cyst of ovary     bilateral  . Anemia     history of anemia secondary to fibroid sx  . Low back pain   . Lumbar disc disease 07/20/2010  . Cervical disc disease 07/20/2010  . Vitamin D deficiency 07/20/2010  . Iron deficiency 07/20/2010  . Osteoporosis 07/20/2010  . 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 Vaginal hysterectomy   . Right lateral epicondylar   . Fibroid sx   . Colonoscopy     Current Outpatient Prescriptions  Medication Sig Dispense Refill  . ALPRAZolam (XANAX) 0.5 MG tablet Take 1 tablet (0.5 mg total) by mouth daily as needed.  30 tablet  2  . eletriptan (RELPAX) 40 MG tablet One tablet by mouth as needed for migraine headache.  If the headache improves and then returns, dose may be repeated after 2 hours have elapsed since first dose (do not exceed 80 mg per day). may repeat in 2 hours if necessary       . esomeprazole (NEXIUM) 40 MG capsule Take 40 mg by mouth daily before breakfast.        . levothyroxine (SYNTHROID,  LEVOTHROID) 50 MCG tablet Take 50 mcg by mouth daily.        . naproxen sodium (ANAPROX) 220 MG tablet Take 220 mg by mouth 2 (two) times daily with a meal.        . ondansetron (ZOFRAN) 4 MG tablet Take 4 mg by mouth every 8 (eight) hours as needed.        . propranolol (INDERAL) 10 MG tablet 1 DAILY AS NEEDED FOR PALPITATIONS.  30 tablet  2  . ranitidine (ZANTAC) 150 MG capsule Take 150 mg by mouth 2 (two) times daily.        Marland Kitchen VALTREX 1 G tablet TAKE 1 TABLET TWICE DAILY FOR 5 DAYS AS NEEDED.  10 each  1    Allergies  Allergen Reactions  . Sulfonamide Derivatives     History   Social History  . Marital Status: Married    Spouse Name: N/A    Number of Children: N/A  . Years of Education: N/A   Occupational History  . cna    Social History Main Topics  . Smoking status: Former Games developer  . Smokeless tobacco: Not on file   Comment: quit 30 years ago  . Alcohol Use: No  . Drug Use: No  . Sexually Active: Not on file   Other Topics Concern  . 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    Family History  Problem Relation Age of Onset  . Alcohol abuse Mother   . Alcohol abuse Father   . Heart attack Father     died suddenly at home with presumed MI    Physical Exam: Filed Vitals:   12/11/10 1627  BP: 117/67  Pulse: 55  Height: 5\' 7"  (1.702 m)  Weight: 129 lb (58.514 kg)    GEN- The patient is well appearing, alert and oriented x 3 today.   Head- normocephalic, atraumatic Eyes-  Sclera clear, conjunctiva pink Ears- hearing intact Oropharynx- clear Neck- supple, no JVP Lymph- no cervical lymphadenopathy Lungs- Clear to ausculation bilaterally, normal work of breathing Heart- Regular rate and rhythm, no murmurs, rubs or gallops, PMI not laterally displaced GI- soft, NT, ND, + BS Extremities- no clubbing, cyanosis, or edema MS- no significant deformity or  atrophy Skin- no rash or lesion Psych- euthymic mood, full affect Neuro- strength and sensation are intact  ekg today reveals sinus rhythm 55 bpm, RBBB, otherwise normal ekg  Assessment and Plan:

## 2010-12-11 NOTE — Patient Instructions (Signed)
Start nadolol 20 mg one half tablet once daily

## 2010-12-19 ENCOUNTER — Other Ambulatory Visit: Payer: Self-pay | Admitting: Internal Medicine

## 2011-01-24 ENCOUNTER — Other Ambulatory Visit: Payer: Self-pay

## 2011-01-24 ENCOUNTER — Telehealth: Payer: Self-pay | Admitting: Internal Medicine

## 2011-01-24 MED ORDER — NADOLOL 20 MG PO TABS: 10.0000 mg | ORAL_TABLET | Freq: Two times a day (BID) | ORAL | Status: DC

## 2011-01-24 MED ORDER — LEVOTHYROXINE SODIUM 50 MCG PO TABS: 50.0000 ug | ORAL_TABLET | Freq: Every day | ORAL | Status: DC

## 2011-01-24 NOTE — Telephone Encounter (Signed)
New refill Pt wants refill of nadalol she now takes 1 tablet per day 20 mg, 1/2 in morning. 1/2 at night [facility] [address] She wants 90 day supply

## 2011-01-25 ENCOUNTER — Other Ambulatory Visit: Payer: Self-pay | Admitting: Internal Medicine

## 2011-01-25 MED ORDER — NADOLOL 20 MG PO TABS: 10.0000 mg | ORAL_TABLET | Freq: Two times a day (BID) | ORAL | Status: DC

## 2011-01-26 ENCOUNTER — Other Ambulatory Visit: Payer: Self-pay

## 2011-01-26 ENCOUNTER — Other Ambulatory Visit: Payer: Self-pay | Admitting: Internal Medicine

## 2011-01-26 MED ORDER — NADOLOL 20 MG PO TABS: 10.0000 mg | ORAL_TABLET | Freq: Two times a day (BID) | ORAL | Status: DC

## 2011-01-26 NOTE — Telephone Encounter (Signed)
[facility] [phone].

## 2011-01-31 ENCOUNTER — Other Ambulatory Visit: Payer: Self-pay | Admitting: Internal Medicine

## 2011-01-31 MED ORDER — NADOLOL 20 MG PO TABS: 10.0000 mg | ORAL_TABLET | Freq: Every day | ORAL | Status: DC

## 2011-03-29 ENCOUNTER — Other Ambulatory Visit: Payer: Self-pay | Admitting: *Deleted

## 2011-03-30 ENCOUNTER — Other Ambulatory Visit: Payer: Self-pay | Admitting: *Deleted

## 2011-03-30 MED ORDER — NADOLOL 20 MG PO TABS: 10.0000 mg | ORAL_TABLET | Freq: Every day | ORAL | Status: DC

## 2011-04-01 ENCOUNTER — Other Ambulatory Visit: Payer: Self-pay | Admitting: Internal Medicine

## 2011-04-02 ENCOUNTER — Other Ambulatory Visit: Payer: Self-pay | Admitting: *Deleted

## 2011-04-02 ENCOUNTER — Other Ambulatory Visit: Payer: Self-pay

## 2011-04-02 ENCOUNTER — Other Ambulatory Visit: Payer: Self-pay | Admitting: Internal Medicine

## 2011-04-02 MED ORDER — NADOLOL 20 MG PO TABS: 10.0000 mg | ORAL_TABLET | Freq: Every day | ORAL | Status: DC

## 2011-04-02 MED ORDER — NADOLOL 20 MG PO TABS: 20.0000 mg | ORAL_TABLET | Freq: Every day | ORAL | Status: DC

## 2011-04-02 NOTE — Telephone Encounter (Signed)
They need to clarify directions on [place]

## 2011-04-03 ENCOUNTER — Other Ambulatory Visit: Payer: Self-pay | Admitting: *Deleted

## 2011-04-03 MED ORDER — NADOLOL 20 MG PO TABS: ORAL_TABLET | ORAL | Status: DC

## 2011-04-16 ENCOUNTER — Telehealth: Payer: Self-pay | Admitting: Internal Medicine

## 2011-04-16 MED ORDER — SUMATRIPTAN SUCCINATE 100 MG PO TABS: 100.0000 mg | ORAL_TABLET | ORAL | Status: AC | PRN

## 2011-04-16 NOTE — Telephone Encounter (Signed)
Ok to change the relpax, to imitrex - done per escript

## 2011-04-16 NOTE — Telephone Encounter (Signed)
The pt called and stated she needed a refill of imitrex (spelling?) for migraines.  Thanks!

## 2011-04-17 NOTE — Telephone Encounter (Signed)
Patient informed. 

## 2011-04-19 ENCOUNTER — Other Ambulatory Visit: Payer: Self-pay

## 2011-04-19 MED ORDER — ZOLPIDEM TARTRATE ER 6.25 MG PO TBCR: 6.2500 mg | EXTENDED_RELEASE_TABLET | Freq: Every evening | ORAL | Status: DC | PRN

## 2011-04-19 NOTE — Telephone Encounter (Signed)
Done hardcopy to [facility]  

## 2011-04-19 NOTE — Telephone Encounter (Signed)
Faxed hardcopy to pharmacy. 

## 2011-06-07 ENCOUNTER — Other Ambulatory Visit: Payer: Self-pay | Admitting: Internal Medicine

## 2011-07-12 ENCOUNTER — Other Ambulatory Visit: Payer: Self-pay

## 2011-07-12 DIAGNOSIS — F411 Generalized anxiety disorder: Secondary | ICD-10-CM

## 2011-07-12 MED ORDER — ALPRAZOLAM 0.5 MG PO TABS: 0.5000 mg | ORAL_TABLET | Freq: Every day | ORAL | Status: DC | PRN

## 2011-07-12 NOTE — Telephone Encounter (Signed)
Faxed hardcopy to pharmacy. 

## 2011-07-12 NOTE — Telephone Encounter (Signed)
Done hardcopy to [facility]  Needs OV for further refills 

## 2011-08-08 ENCOUNTER — Ambulatory Visit (INDEPENDENT_AMBULATORY_CARE_PROVIDER_SITE_OTHER): Payer: BC Managed Care – PPO | Admitting: Internal Medicine

## 2011-08-08 ENCOUNTER — Encounter: Payer: Self-pay | Admitting: Internal Medicine

## 2011-08-08 VITALS — BP 110/72 | HR 60 | Temp 97.6°F | Ht 66.5 in | Wt 134.4 lb

## 2011-08-08 DIAGNOSIS — Z0000 Encounter for general adult medical examination without abnormal findings: Secondary | ICD-10-CM

## 2011-08-08 DIAGNOSIS — F329 Major depressive disorder, single episode, unspecified: Secondary | ICD-10-CM

## 2011-08-08 DIAGNOSIS — J029 Acute pharyngitis, unspecified: Secondary | ICD-10-CM | POA: Insufficient documentation

## 2011-08-08 DIAGNOSIS — R300 Dysuria: Secondary | ICD-10-CM | POA: Insufficient documentation

## 2011-08-08 MED ORDER — BENZONATATE 100 MG PO CAPS: 100.0000 mg | ORAL_CAPSULE | Freq: Two times a day (BID) | ORAL | Status: AC | PRN

## 2011-08-08 MED ORDER — LEVOFLOXACIN 250 MG PO TABS: 250.0000 mg | ORAL_TABLET | Freq: Every day | ORAL | Status: AC

## 2011-08-08 MED ORDER — FLUCONAZOLE 150 MG PO TABS: ORAL_TABLET | ORAL | Status: AC

## 2011-08-08 MED ORDER — PROMETHAZINE-CODEINE 6.25-10 MG/5ML PO SYRP: 5.0000 mL | ORAL_SOLUTION | Freq: Four times a day (QID) | ORAL | Status: AC | PRN

## 2011-08-08 NOTE — Patient Instructions (Addendum)
Take all new medications as prescribed Continue all other medications as before Please go to LAB in the Basement for the urine tests to be done today Please return in 3 mo with Lab testing done 3-5 days before

## 2011-08-09 ENCOUNTER — Encounter: Payer: Self-pay | Admitting: Internal Medicine

## 2011-08-09 NOTE — Assessment & Plan Note (Signed)
?   [facility]  - for urine study, and tx as per pharyngitis today

## 2011-08-09 NOTE — Progress Notes (Signed)
Subjective:    Patient ID: [name], female    DOB: 22-Aug-1960, 51 y.o.   MRN: [id number]  HPI   Here with 3 days acute onset fever, severe ST, general weakness and malaise, but little to no cough and Pt denies chest pain, increased sob or doe, wheezing, orthopnea, PND, increased LE swelling, palpitations, dizziness or syncope.  Pt denies new neurological symptoms such as new headache, or facial or extremity weakness or numbness   Pt denies polydipsia, polyuria but has had 1 wk intermittent dysuria as well,  without frequency, urgency,or hematuria, n/v, flank pain.  Denies worsening depressive symptoms, suicidal ideation, or panic, though has ongoing anxiety, not increased recently.    Past Medical History  Diagnosis Date  . Hypothyroidism   . Anxiety and depression   . Allergic rhinitis   . IBS (irritable bowel syndrome)   . Hyperlipidemia   . Foot fracture, left     hx of  . Cyst of ovary     bilateral  . Anemia     history of anemia secondary to fibroid sx  . Low back pain   . Lumbar disc disease 07/20/2010  . Cervical disc disease 07/20/2010  . Vitamin d deficiency 07/20/2010  . Iron deficiency 07/20/2010  . Osteoporosis 07/20/2010  . 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 Vaginal hysterectomy   . Right lateral epicondylar   . Fibroid sx   . Colonoscopy     reports that she has quit smoking. She does not have any smokeless tobacco history on file. She reports that she does not drink alcohol or use illicit drugs. family history includes Alcohol abuse in her father and mother and Heart attack in her father. Allergies  Allergen Reactions  . Sulfonamide Derivatives    Current Outpatient Prescriptions on File Prior to Visit  Medication Sig Dispense Refill  . ALPRAZolam (XANAX) 0.5 MG tablet Take 1 tablet (0.5 mg total) by mouth daily as needed.  30 tablet  0  . levothyroxine (SYNTHROID, LEVOTHROID) 50 MCG tablet Take 1 tablet (50 mcg total)  by mouth daily.  90 tablet  1  . nadolol (CORGARD) 20 MG tablet But can take additional 1/2 tablet (10 mg) if needed for PVC's  (up to a total of 30 mg daily)  90 tablet  3  . naproxen sodium (ANAPROX) 220 MG tablet Take 220 mg by mouth 2 (two) times daily with a meal.        . propranolol (INDERAL) 10 MG tablet 1 DAILY AS NEEDED FOR PALPITATIONS.  30 tablet  2  . ranitidine (ZANTAC) 150 MG capsule Take 150 mg by mouth 2 (two) times daily.        . RELPAX 40 MG tablet TAKE (1) TABLET DAILY AS NEEDED.  10 each  3  . SUMAtriptan (IMITREX) 100 MG tablet Take 1 tablet (100 mg total) by mouth every 2 (two) hours as needed for migraine.  10 tablet  5  . VALTREX 1 G tablet TAKE 1 TABLET TWICE DAILY FOR 5 DAYS AS NEEDED.  10 each  2  . ZOFRAN 4 MG tablet TAKE 1 TABLET EVERY 8 HOURS AS NEEDED FOR NAUSEA.  30 each  1  . zolpidem (AMBIEN CR) 6.25 MG CR tablet Take 1 tablet (6.25 mg total) by mouth at bedtime as needed.  30 tablet  3  . esomeprazole (NEXIUM) 40 MG capsule Take 40 mg by mouth daily before breakfast.  Review of Systems Review of Systems  Constitutional: Negative for diaphoresis and unexpected weight change.  HENT: Negative for tinnitus.   Eyes: Negative for photophobia and visual disturbance.  Respiratory: Negative for choking and stridor.   Gastrointestinal: Negative for vomiting and blood in stool.  Genitourinary: Negative for hematuria and decreased urine volume.  Musculoskeletal: Negative for gait problem.  Neurological: Negative for tremors and numbness.     Objective:   Physical Exam BP 110/72  Pulse 60  Temp 97.6 F (36.4 C) (Oral)  Ht 5' 6.5" (1.689 m)  Wt 134 lb 6 oz (60.952 kg)  BMI 21.36 kg/m2  SpO2 97% Physical Exam  VS noted Constitutional: Pt appears well-developed and well-nourished.  HENT: Head: Normocephalic.  Right Ear: External ear normal.  Left Ear: External ear normal.  Bilat tm's mild erythema.  Sinus nontender.  Pharynx severe erythema with  exudate Eyes: Conjunctivae and EOM are normal. Pupils are equal, round, and reactive to light.  Neck: Normal range of motion. Neck supple. with bilat submandib LA tender noted Cardiovascular: Normal rate and regular rhythm.   Pulmonary/Chest: Effort normal and breath sounds normal.  Abd: soft, NT, + BS - no guarding or rebound Neurological: Pt is alert Skin: Skin is warm. No erythema.  Psychiatric: Pt behavior is normal. Thought content normal. 1+ nervous, not depressed affect    Assessment & Plan:

## 2011-08-09 NOTE — Assessment & Plan Note (Signed)
Mild to mod, for antibx course,  to f/u any worsening symptoms or concerns 

## 2011-08-09 NOTE — Assessment & Plan Note (Signed)
stable overall by hx and exam, most recent data reviewed with pt, and pt to continue medical treatment as before Lab Results  Component Value Date   WBC 3.4* 07/20/2010   HGB 13.1 07/20/2010   HCT 38.3 07/20/2010   PLT 186.0 07/20/2010   GLUCOSE 89 07/20/2010   CHOL 216* 07/20/2010   TRIG 42.0 07/20/2010   HDL 95.70 07/20/2010   LDLDIRECT 95.5 07/20/2010   ALT 17 07/20/2010   AST 23 07/20/2010   NA 141 07/20/2010   K 4.4 07/20/2010   CL 105 07/20/2010   CREATININE 0.8 07/20/2010   BUN 20 07/20/2010   CO2 30 07/20/2010   TSH 2.21 07/20/2010

## 2011-10-26 ENCOUNTER — Telehealth: Payer: Self-pay | Admitting: Internal Medicine

## 2011-10-26 NOTE — Telephone Encounter (Signed)
Caller: [name]/Patient; Patient Name: [name]; PCP: Oliver Barre (Adults only); Best Callback Phone Number: [phone]; Reason for call: She states three days ago 10/23/11, having sharp pains in her head behind her ear on the right side of her head.  She went to [facility].  She was told by Provider at [facility] that it was Right Ear redness.  She has been placed on Toradol, PCN and ciprodex ear gtts. .  She states she felt better after the Toradol; However she continues to feel the "sharp stabbing pains" behind her ear.  She has taken 2 advil, 1/2 vicodin and relpack and it is easing off some.  Afebrile, no rash, no ringing in ears, no drainge.  Patient complains of ongoing discomfort.  Emergent s/sx ruled out per Ear: symptoms with exception to Severe pain unresponsive to 24 hours of home care".  See Provider in 4 hours. Contacted office for appt. Appointment full. Transferred caller to schedule appointment. Spoke with Jasmine December. Advised to send to ER if needed

## 2011-11-25 ENCOUNTER — Other Ambulatory Visit: Payer: Self-pay | Admitting: Internal Medicine

## 2011-11-26 NOTE — Telephone Encounter (Signed)
Faxed hardcopy to [facility] [address] 

## 2011-11-26 NOTE — Telephone Encounter (Signed)
Done hardcopy to [facility]  

## 2011-12-06 ENCOUNTER — Other Ambulatory Visit: Payer: Self-pay

## 2011-12-06 MED ORDER — VALACYCLOVIR HCL 1 G PO TABS: 1000.0000 mg | ORAL_TABLET | Freq: Two times a day (BID) | ORAL | Status: DC

## 2011-12-07 ENCOUNTER — Other Ambulatory Visit: Payer: Self-pay | Admitting: Internal Medicine

## 2011-12-07 LAB — HM MAMMOGRAPHY: HM Mammogram: NORMAL

## 2011-12-31 ENCOUNTER — Ambulatory Visit: Payer: BC Managed Care – PPO | Admitting: Internal Medicine

## 2012-03-03 ENCOUNTER — Encounter: Payer: BC Managed Care – PPO | Admitting: Internal Medicine

## 2012-04-18 ENCOUNTER — Telehealth: Payer: Self-pay

## 2012-04-18 DIAGNOSIS — Z0000 Encounter for general adult medical examination without abnormal findings: Secondary | ICD-10-CM

## 2012-04-18 NOTE — Telephone Encounter (Signed)
Labs entered.

## 2012-05-08 ENCOUNTER — Other Ambulatory Visit: Payer: Self-pay | Admitting: Internal Medicine

## 2012-05-30 ENCOUNTER — Encounter: Payer: Self-pay | Admitting: Internal Medicine

## 2012-05-30 ENCOUNTER — Ambulatory Visit (INDEPENDENT_AMBULATORY_CARE_PROVIDER_SITE_OTHER): Payer: BC Managed Care – PPO | Admitting: Internal Medicine

## 2012-05-30 VITALS — BP 100/60 | HR 62 | Temp 98.4°F | Ht 67.0 in | Wt 132.1 lb

## 2012-05-30 DIAGNOSIS — Z0000 Encounter for general adult medical examination without abnormal findings: Secondary | ICD-10-CM

## 2012-05-30 DIAGNOSIS — Z23 Encounter for immunization: Secondary | ICD-10-CM

## 2012-05-30 DIAGNOSIS — F411 Generalized anxiety disorder: Secondary | ICD-10-CM

## 2012-05-30 MED ORDER — LEVOTHYROXINE SODIUM 50 MCG PO TABS: 50.0000 ug | ORAL_TABLET | Freq: Every day | ORAL | Status: DC

## 2012-05-30 MED ORDER — VALACYCLOVIR HCL 1 G PO TABS: ORAL_TABLET | ORAL | Status: DC

## 2012-05-30 MED ORDER — ELETRIPTAN HYDROBROMIDE 40 MG PO TABS: ORAL_TABLET | ORAL | Status: DC

## 2012-05-30 MED ORDER — ZOLPIDEM TARTRATE ER 6.25 MG PO TBCR: EXTENDED_RELEASE_TABLET | ORAL | Status: DC

## 2012-05-30 MED ORDER — ALPRAZOLAM 0.5 MG PO TABS: 0.5000 mg | ORAL_TABLET | Freq: Every day | ORAL | Status: DC | PRN

## 2012-05-30 NOTE — Patient Instructions (Addendum)
You had the tetanus shot today (Tdap) Please continue all other medications as before, and refills have been done if requested. Please have the pharmacy call with any other refills you may need. Please continue your efforts at being more active, low cholesterol diet, and weight control. You are otherwise up to date with prevention measures today. Thank you for enrolling in MyChart. Please follow the instructions below to securely access your online medical record. MyChart allows you to send messages to your doctor, view your test results, renew your prescriptions, schedule appointments, and more. To Log into My Chart online, please go by [employer] or [employer] to [email or web address], or download the MyChart App from the [employer] .  Your Username is: [email or web address] (password [email or web address]) Please return in 1 year for your yearly visit, or sooner if needed

## 2012-05-30 NOTE — Assessment & Plan Note (Signed)

## 2012-05-30 NOTE — Assessment & Plan Note (Signed)
stable overall by history and exam, and pt to continue medical treatment as before,  to f/u any worsening symptoms or concerns 

## 2012-05-30 NOTE — Progress Notes (Signed)
Subjective:    Patient ID: [name], female    DOB: 1960-11-22, 52 y.o.   MRN: [id number]  HPI  Here for wellness and f/u;  Overall doing ok;  Pt denies CP, worsening SOB, DOE, wheezing, orthopnea, PND, worsening LE edema, palpitations, dizziness or syncope.  Pt denies neurological change such as new headache, facial or extremity weakness.  Pt denies polydipsia, polyuria, or low sugar symptoms. Pt states overall good compliance with treatment and medications, good tolerability, and has been trying to follow lower cholesterol diet.  Pt denies worsening depressive symptoms, suicidal ideation or panic. No fever, night sweats, wt loss, loss of appetite, or other constitutional symptoms.  Pt states good ability with ADL's, has low fall risk, home safety reviewed and adequate, no other significant changes in hearing or vision, and only occasionally active with exercise.  Denies worsening depressive symptoms, suicidal ideation, or panic; has ongoing anxiety.   Past Medical History  Diagnosis Date  . Hypothyroidism   . Anxiety and depression   . Allergic rhinitis   . IBS (irritable bowel syndrome)   . Hyperlipidemia   . Foot fracture, left     hx of  . Cyst of ovary     bilateral  . Anemia     history of anemia secondary to fibroid sx  . Low back pain   . Lumbar disc disease 07/20/2010  . Cervical disc disease 07/20/2010  . Vitamin D deficiency 07/20/2010  . Iron deficiency 07/20/2010  . Osteoporosis 07/20/2010  . 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 Vaginal hysterectomy    . Right lateral epicondylar    . Fibroid sx    . Colonoscopy      reports that she has quit smoking. She does not have any smokeless tobacco history on file. She reports that she does not drink alcohol or use illicit drugs. family history includes Alcohol abuse in her father and mother and Heart attack in her father. Allergies  Allergen Reactions  . Sulfonamide  Derivatives    Current Outpatient Prescriptions on File Prior to Visit  Medication Sig Dispense Refill  . nadolol (CORGARD) 20 MG tablet But can take additional 1/2 tablet (10 mg) if needed for PVC's  (up to a total of 30 mg daily)  90 tablet  3  . propranolol (INDERAL) 10 MG tablet 1 DAILY AS NEEDED FOR PALPITATIONS.  30 tablet  2  . ZOFRAN 4 MG tablet TAKE 1 TABLET EVERY 8 HOURS AS NEEDED FOR NAUSEA.  30 each  1   No current facility-administered medications on file prior to visit.   Review of Systems Constitutional: Negative for diaphoresis, activity change, appetite change or unexpected weight change.  HENT: Negative for hearing loss, ear pain, facial swelling, mouth sores and neck stiffness.   Eyes: Negative for pain, redness and visual disturbance.  Respiratory: Negative for shortness of breath and wheezing.   Cardiovascular: Negative for chest pain and palpitations.  Gastrointestinal: Negative for diarrhea, blood in stool, abdominal distention or other pain Genitourinary: Negative for hematuria, flank pain or change in urine volume.  Musculoskeletal: Negative for myalgias and joint swelling.  Skin: Negative for color change and wound.  Neurological: Negative for syncope and numbness. other than noted Hematological: Negative for adenopathy.  Psychiatric/Behavioral: Negative for hallucinations, self-injury, decreased concentration and agitation.      Objective:   Physical Exam BP 100/60  Pulse 62  Temp(Src) 98.4 F (36.9 C) (Oral)  Ht  5\' 7"  (1.702 m)  Wt 132 lb 2 oz (59.932 kg)  BMI 20.69 kg/m2  SpO2 99% VS noted,  Constitutional: Pt is oriented to person, place, and time. Appears well-developed and well-nourished.  Head: Normocephalic and atraumatic.  Right Ear: External ear normal.  Left Ear: External ear normal.  Nose: Nose normal.  Mouth/Throat: Oropharynx is clear and moist.  Eyes: Conjunctivae and EOM are normal. Pupils are equal, round, and reactive to light.   Neck: Normal range of motion. Neck supple. No JVD present. No tracheal deviation present.  Cardiovascular: Normal rate, regular rhythm, normal heart sounds and intact distal pulses.   Pulmonary/Chest: Effort normal and breath sounds normal.  Abdominal: Soft. Bowel sounds are normal. There is no tenderness. No HSM  Musculoskeletal: Normal range of motion. Exhibits no edema.  Lymphadenopathy:  Has no cervical adenopathy.  Neurological: Pt is alert and oriented to person, place, and time. Pt has normal reflexes. No cranial nerve deficit.  Skin: Skin is warm and dry. No rash noted.  Psychiatric:  Has  normal mood and affect. Behavior is normal. mild nervous    Assessment & Plan:

## 2012-06-08 ENCOUNTER — Encounter: Payer: Self-pay | Admitting: Internal Medicine

## 2012-06-09 ENCOUNTER — Encounter: Payer: Self-pay | Admitting: Internal Medicine

## 2012-06-25 ENCOUNTER — Encounter: Payer: Self-pay | Admitting: Internal Medicine

## 2012-06-26 MED ORDER — ONDANSETRON 4 MG PO TBDP: 4.0000 mg | ORAL_TABLET | Freq: Three times a day (TID) | ORAL | Status: DC | PRN

## 2012-08-28 ENCOUNTER — Ambulatory Visit (INDEPENDENT_AMBULATORY_CARE_PROVIDER_SITE_OTHER): Payer: BC Managed Care – PPO | Admitting: Internal Medicine

## 2012-08-28 ENCOUNTER — Encounter: Payer: Self-pay | Admitting: Internal Medicine

## 2012-08-28 VITALS — BP 120/74 | HR 64 | Temp 98.2°F | Wt 127.0 lb

## 2012-08-28 DIAGNOSIS — J309 Allergic rhinitis, unspecified: Secondary | ICD-10-CM

## 2012-08-28 DIAGNOSIS — J069 Acute upper respiratory infection, unspecified: Secondary | ICD-10-CM

## 2012-08-28 MED ORDER — AZITHROMYCIN 250 MG PO TABS: ORAL_TABLET | ORAL | Status: DC

## 2012-08-28 MED ORDER — CIPROFLOXACIN-DEXAMETHASONE 0.3-0.1 % OT SUSP: 4.0000 [drp] | Freq: Two times a day (BID) | OTIC | Status: DC

## 2012-08-28 MED ORDER — METHYLPREDNISOLONE ACETATE 80 MG/ML IJ SUSP
80.0000 mg | Freq: Once | INTRAMUSCULAR | Status: AC
  Administered 2012-08-28: 80 mg via INTRAMUSCULAR

## 2012-08-28 NOTE — Progress Notes (Signed)
HPI  Pt presents to the clinic today with c/o cold symptoms x 1weeks. The worst part is the sore throat and dry cough. She does not produce any sputum. It did seem to get worse today. She did experience sharp pain in her right ear, headache and nausea. She almost felt like she was going to pass out. She laid down and felt better after about 15 minutes. She has had sick contacts. She has a history of allergies but not asthma. She has not tried anything OTC for this.  Review of Systems      Past Medical History  Diagnosis Date  . Hypothyroidism   . Anxiety and depression   . Allergic rhinitis   . IBS (irritable bowel syndrome)   . Hyperlipidemia   . Foot fracture, left     hx of  . Cyst of ovary     bilateral  . Anemia     history of anemia secondary to fibroid sx  . Low back pain   . Lumbar disc disease 07/20/2010  . Cervical disc disease 07/20/2010  . Vitamin D deficiency 07/20/2010  . Iron deficiency 07/20/2010  . Osteoporosis 07/20/2010  . 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 Alcohol abuse Mother   . Alcohol abuse Father   . Heart attack Father     died suddenly at home with presumed MI    History   Social History  . Marital Status: Married    Spouse Name: N/A    Number of Children: N/A  . Years of Education: N/A   Occupational History  . cna    Social History Main Topics  . Smoking status: Former Games developer  . Smokeless tobacco: Not on file     Comment: quit 30 years ago  . Alcohol Use: No  . Drug Use: No  . Sexually Active: Not on file   Other Topics Concern  . 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          Allergies  Allergen Reactions  . Sulfonamide Derivatives      Constitutional: Positive headache, fatigue and fever. Denies abrupt weight changes.  HEENT:  Positive sore throat.  Denies eye redness, eye pain, pressure behind the eyes, facial pain, nasal congestion, ear pain, ringing in the ears, wax buildup, runny nose or bloody nose. Respiratory: Positive cough. Denies difficulty breathing or shortness of breath.  Cardiovascular: Denies chest pain, chest tightness, palpitations or swelling in the hands or feet.   No other specific complaints in a complete review of systems (except as listed in HPI above).  Objective:   BP 120/74  Pulse 64  Temp(Src) 98.2 F (36.8 C) (Oral)  Wt 127 lb (57.607 kg)  BMI 19.89 kg/m2  SpO2 99% Wt Readings from Last 3 Encounters:  08/28/12 127 lb (57.607 kg)  05/30/12 132 lb 2 oz (59.932 kg)  08/08/11 134 lb 6 oz (60.952 kg)     General: Appears her stated age, well developed, well nourished in NAD. HEENT: Head: normal shape and size; Eyes: sclera white, no icterus, conjunctiva pink, PERRLA and EOMs intact; Ears: Tm's gray and intact, normal light reflex; Nose: mucosa boggy and moist, septum midline; Throat/Mouth: + PND. Teeth present, mucosa erythematous and moist, no exudate noted, no lesions or ulcerations noted.  Neck:  Mild cervical lymphadenopathy. Neck supple, trachea midline. No massses, lumps or thyromegaly present.  Cardiovascular: Normal rate and rhythm. S1,S2 noted.  No murmur, rubs or gallops noted. No JVD or BLE edema. No carotid bruits noted. Pulmonary/Chest: Normal effort and positive vesicular breath sounds. No respiratory distress. No wheezes, rales or ronchi noted.      Assessment & Plan:   Upper Respiratory Infection  Get some rest and drink plenty of water Do salt water gargles for the sore throat eRx for Azithromax x 5 days  Allergic Rhinitis:  Try Zyrtec OTC 80 mg Depo IM today  RTC as needed or if symptoms persist.

## 2012-08-28 NOTE — Progress Notes (Deleted)
Subjective:    Patient ID: [name], female    DOB: 01/29/1961, 52 y.o.   MRN: [id number]  HPI  Pt presents to the clinic today with c/o ear pain, ringing in her right ear, headache and nausea. This started.  Review of Systems  Past Medical History  Diagnosis Date  . Hypothyroidism   . Anxiety and depression   . Allergic rhinitis   . IBS (irritable bowel syndrome)   . Hyperlipidemia   . Foot fracture, left     hx of  . Cyst of ovary     bilateral  . Anemia     history of anemia secondary to fibroid sx  . Low back pain   . Lumbar disc disease 07/20/2010  . Cervical disc disease 07/20/2010  . Vitamin D deficiency 07/20/2010  . Iron deficiency 07/20/2010  . Osteoporosis 07/20/2010  . 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 nadolol (CORGARD) 20 MG tablet But can take additional 1/2 tablet (10 mg) if needed for PVC's  (up to a total of 30 mg daily)  90 tablet  3  . ondansetron (ZOFRAN ODT) 4 MG disintegrating tablet Take 1 tablet (4 mg total) by mouth every 8 (eight) hours as needed.  30 tablet  2  . propranolol (INDERAL) 10 MG tablet 1 DAILY AS NEEDED FOR PALPITATIONS.  30 tablet  2  . ALPRAZolam (XANAX) 0.5 MG tablet Take 1 tablet (0.5 mg total) by mouth daily as needed.  30 tablet  5  . eletriptan (RELPAX) 40 MG tablet One tablet by mouth at onset of headache. May repeat in 2 hours if headache persists or recurs. TAKE (1) TABLET DAILY AS NEEDED.  10 tablet  11  . levothyroxine (SYNTHROID, LEVOTHROID) 50 MCG tablet Take 1 tablet (50 mcg total) by mouth daily.  90 tablet  3  . valACYclovir (VALTREX) 1000 MG tablet TAKE 1 TABLET TWICE DAILY FOR 5 DAYS AS NEEDED.  10 tablet  11  . zolpidem (AMBIEN CR) 6.25 MG CR tablet TAKE 1 TABLET AT BEDTIME AS NEEDED FOR SLEEP  30 tablet  5   No current facility-administered medications for this visit.    Allergies  Allergen Reactions  . Sulfonamide Derivatives     Family  History  Problem Relation Age of Onset  . Alcohol abuse Mother   . Alcohol abuse Father   . Heart attack Father     died suddenly at home with presumed MI    History   Social History  . Marital Status: Married    Spouse Name: N/A    Number of Children: N/A  . Years of Education: N/A   Occupational History  . cna    Social History Main Topics  . Smoking status: Former Games developer  . Smokeless tobacco: Not on file     Comment: quit 30 years ago  . Alcohol Use: No  . Drug Use: No  . Sexually Active: Not on file   Other Topics Concern  . 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.           Constitutional: Denies fever, malaise, fatigue, headache or abrupt weight changes.  HEENT: Denies eye pain, eye redness, ear pain, ringing in the ears, wax buildup, runny nose, nasal congestion, bloody nose, or sore throat.  Respiratory: Denies difficulty breathing, shortness of breath, cough or sputum production.   Cardiovascular: Denies chest pain, chest tightness, palpitations or swelling in the hands or feet.  Gastrointestinal: Denies abdominal pain, bloating, constipation, diarrhea or blood in the stool.  GU: Denies urgency, frequency, pain with urination, burning sensation, blood in urine, odor or discharge. Musculoskeletal: Denies decrease in range of motion, difficulty with gait, muscle pain or joint pain and swelling.  Skin: Denies redness, rashes, lesions or ulcercations.  Neurological: Denies dizziness, difficulty with memory, difficulty with speech or problems with balance and coordination.   No other specific complaints in a complete review of systems (except as listed in HPI above).     Objective:   Physical Exam  BP 120/74  Pulse 64  Temp(Src) 98.2 F (36.8 C) (Oral)  Wt 127 lb (57.607 kg)  BMI 19.89 kg/m2  SpO2 99% Wt Readings from Last 3  Encounters:  08/28/12 127 lb (57.607 kg)  05/30/12 132 lb 2 oz (59.932 kg)  08/08/11 134 lb 6 oz (60.952 kg)    General: Appears their stated age, well developed, well nourished in NAD. Skin: Warm, dry and intact. No rashes, lesions or ulcerations noted. HEENT: Head: normal shape and size; Eyes: sclera white, no icterus, conjunctiva pink, PERRLA and EOMs intact; Ears: Tm's gray and intact, normal light reflex; Nose: mucosa pink and moist, septum midline; Throat/Mouth: Teeth present, mucosa pink and moist, no exudate, lesions or ulcerations noted.  Neck: Normal range of motion. Neck supple, trachea midline. No massses, lumps or thyromegaly present.  Cardiovascular: Normal rate and rhythm. S1,S2 noted.  No murmur, rubs or gallops noted. No JVD or BLE edema. No carotid bruits noted. Pulmonary/Chest: Normal effort and positive vesicular breath sounds. No respiratory distress. No wheezes, rales or ronchi noted.  Abdomen: Soft and nontender. Normal bowel sounds, no bruits noted. No distention or masses noted. Liver, spleen and kidneys non palpable. Musculoskeletal: Normal range of motion. No signs of joint swelling. No difficulty with gait.  Neurological: Alert and oriented. Cranial nerves II-XII intact. Coordination normal. +DTRs bilaterally. Psychiatric: Mood and affect normal. Behavior is normal. Judgment and thought content normal.   EKG:  BMET    Component Value Date/Time   NA 141 07/20/2010 0822   K 4.4 07/20/2010 0822   CL 105 07/20/2010 0822   CO2 30 07/20/2010 0822   GLUCOSE 89 07/20/2010 0822   BUN 20 07/20/2010 0822   CREATININE 0.8 07/20/2010 0822   CALCIUM 9.4 07/20/2010 0822   CALCIUM 9.7 11/30/2008 2247   GFRNONAA >60 05/24/2010 0930   GFRAA  Value: >60        The eGFR has been calculated using the MDRD equation. This calculation has not been validated in all clinical situations. eGFR's persistently <60 mL/min signify possible Chronic Kidney Disease. 05/24/2010 0930    Lipid Panel      Component Value Date/Time   CHOL 216* 07/20/2010 0822   TRIG 42.0 07/20/2010 0822   HDL 95.70 07/20/2010 0822   CHOLHDL 2 07/20/2010 0822   VLDL 8.4 07/20/2010 0822    CBC    Component Value Date/Time   WBC 3.4* 07/20/2010 0822   RBC 4.09 07/20/2010 0822   HGB 13.1 07/20/2010 0822   HCT 38.3 07/20/2010 0822   PLT 186.0 07/20/2010 0822   MCV 93.5 07/20/2010 0822   MCH 30.6 05/24/2010 0930   MCHC 34.3 07/20/2010 0822   RDW 13.3 07/20/2010 0822   LYMPHSABS 1.5 07/20/2010 0822   MONOABS 0.3 07/20/2010  1610   EOSABS 0.1 07/20/2010 0822   BASOSABS 0.0 07/20/2010 0822    Hgb A1C No results found for this basename: HGBA1C         Assessment & Plan:

## 2012-08-28 NOTE — Addendum Note (Signed)
Addended by: Edwena Felty T on: 08/28/2012 10:36 AM   Modules accepted: Orders

## 2012-08-28 NOTE — Patient Instructions (Signed)

## 2012-08-31 ENCOUNTER — Other Ambulatory Visit: Payer: Self-pay | Admitting: Internal Medicine

## 2012-12-11 ENCOUNTER — Other Ambulatory Visit: Payer: Self-pay

## 2013-01-08 ENCOUNTER — Other Ambulatory Visit: Payer: Self-pay | Admitting: Internal Medicine

## 2013-01-08 NOTE — Telephone Encounter (Signed)
Done hardcopy to [facility]  

## 2013-01-08 NOTE — Telephone Encounter (Signed)
Faxed hardcopy to [facility] [address] 

## 2013-01-09 ENCOUNTER — Other Ambulatory Visit: Payer: Self-pay | Admitting: Internal Medicine

## 2013-01-09 NOTE — Telephone Encounter (Signed)
Already done hardcopy to [facility]

## 2013-04-27 ENCOUNTER — Other Ambulatory Visit: Payer: Self-pay | Admitting: Internal Medicine

## 2013-04-28 NOTE — Telephone Encounter (Signed)
Done hardcopy to [facility]  

## 2013-04-28 NOTE — Telephone Encounter (Signed)
Faxed hardcopy to [facility] [address] [place] 

## 2013-06-05 ENCOUNTER — Telehealth: Payer: Self-pay

## 2013-06-05 MED ORDER — ELETRIPTAN HYDROBROMIDE 40 MG PO TABS: ORAL_TABLET | ORAL | Status: DC

## 2013-06-05 NOTE — Telephone Encounter (Signed)
Patient would like relpax sent to [facility]

## 2013-06-05 NOTE — Telephone Encounter (Signed)
Done erx 

## 2013-06-05 NOTE — Telephone Encounter (Signed)
The patient is hoping to get her migraine medicine filled.  She states [facility] has to have a verbal order for the Relpax med (for migraines) - [facility] number - [place]  Pt callback [phone]

## 2013-06-08 ENCOUNTER — Telehealth: Payer: Self-pay | Admitting: Internal Medicine

## 2013-06-08 NOTE — Telephone Encounter (Signed)
Pharmacy won't give the patient her Relpax.  Insurance needs a call.  She has a card that she has turned in to [facility] for $10.00 for 12 pills.  She has been out for 2 weeks.

## 2013-06-09 LAB — HM MAMMOGRAPHY

## 2013-06-09 NOTE — Telephone Encounter (Signed)
Have received PA form will complete asap and contact the patient with results.

## 2013-06-09 NOTE — Telephone Encounter (Signed)
Initiated PA for Relpax 40 mg.  Received approval for 24 months with no additional information needed.  PV#[id number].  Pharmacy and patient informed by phone and letter.

## 2013-06-16 ENCOUNTER — Encounter: Payer: Self-pay | Admitting: Cardiovascular Disease

## 2013-08-01 ENCOUNTER — Other Ambulatory Visit: Payer: Self-pay | Admitting: Internal Medicine

## 2013-08-04 ENCOUNTER — Other Ambulatory Visit: Payer: Self-pay

## 2013-08-04 MED ORDER — VALACYCLOVIR HCL 1 G PO TABS: 1000.0000 mg | ORAL_TABLET | Freq: Two times a day (BID) | ORAL | Status: DC

## 2013-08-18 ENCOUNTER — Ambulatory Visit (INDEPENDENT_AMBULATORY_CARE_PROVIDER_SITE_OTHER): Payer: 59 | Admitting: Internal Medicine

## 2013-08-18 ENCOUNTER — Other Ambulatory Visit (INDEPENDENT_AMBULATORY_CARE_PROVIDER_SITE_OTHER): Payer: 59

## 2013-08-18 ENCOUNTER — Encounter: Payer: Self-pay | Admitting: Internal Medicine

## 2013-08-18 ENCOUNTER — Telehealth: Payer: Self-pay | Admitting: Internal Medicine

## 2013-08-18 ENCOUNTER — Ambulatory Visit (INDEPENDENT_AMBULATORY_CARE_PROVIDER_SITE_OTHER)
Admission: RE | Admit: 2013-08-18 | Discharge: 2013-08-18 | Disposition: A | Discharge status: Home / Self Care | Payer: 59 | Source: Ambulatory Visit | Attending: Internal Medicine | Admitting: Internal Medicine

## 2013-08-18 VITALS — BP 110/62 | HR 69 | Temp 98.8°F | Ht 66.5 in | Wt 133.5 lb

## 2013-08-18 DIAGNOSIS — M25551 Pain in right hip: Secondary | ICD-10-CM | POA: Insufficient documentation

## 2013-08-18 DIAGNOSIS — E785 Hyperlipidemia, unspecified: Secondary | ICD-10-CM

## 2013-08-18 DIAGNOSIS — Z0000 Encounter for general adult medical examination without abnormal findings: Secondary | ICD-10-CM

## 2013-08-18 DIAGNOSIS — M810 Age-related osteoporosis without current pathological fracture: Secondary | ICD-10-CM

## 2013-08-18 DIAGNOSIS — R5381 Other malaise: Secondary | ICD-10-CM | POA: Insufficient documentation

## 2013-08-18 DIAGNOSIS — M25559 Pain in unspecified hip: Secondary | ICD-10-CM

## 2013-08-18 DIAGNOSIS — R5383 Other fatigue: Secondary | ICD-10-CM

## 2013-08-18 DIAGNOSIS — G4700 Insomnia, unspecified: Secondary | ICD-10-CM

## 2013-08-18 LAB — BASIC METABOLIC PANEL
BUN: 22 mg/dL (ref 6–23)
CALCIUM: 9.1 mg/dL (ref 8.4–10.5)
CO2: 24 mEq/L (ref 19–32)
CREATININE: 0.6 mg/dL (ref 0.4–1.2)
Chloride: 105 mEq/L (ref 96–112)
GFR: 106.69 mL/min (ref >60.00)
Glucose, Bld: 95 mg/dL (ref 70–99)
Potassium: 3.8 mEq/L (ref 3.5–5.1)
Sodium: 140 mEq/L (ref 135–145)

## 2013-08-18 LAB — LIPID PANEL
CHOL/HDL RATIO: 2
Cholesterol: 206 mg/dL — ABNORMAL HIGH (ref 0–200)
HDL: 99 mg/dL (ref >39.00)
LDL CALC: 95 mg/dL (ref 0–99)
NONHDL: 107
TRIGLYCERIDES: 62 mg/dL (ref 0.0–149.0)
VLDL: 12.4 mg/dL (ref 0.0–40.0)

## 2013-08-18 LAB — CBC WITH DIFFERENTIAL/PLATELET
BASOS PCT: 0.4 % (ref 0.0–3.0)
Basophils Absolute: 0 10*3/uL (ref 0.0–0.1)
Eosinophils Absolute: 0.2 10*3/uL (ref 0.0–0.7)
Eosinophils Relative: 2.7 % (ref 0.0–5.0)
HCT: 36.2 % (ref 36.0–46.0)
HEMOGLOBIN: 12.3 g/dL (ref 12.0–15.0)
Lymphocytes Relative: 37.4 % (ref 12.0–46.0)
Lymphs Abs: 2.1 10*3/uL (ref 0.7–4.0)
MCHC: 33.9 g/dL (ref 30.0–36.0)
MCV: 91.5 fl (ref 78.0–100.0)
MONO ABS: 0.5 10*3/uL (ref 0.1–1.0)
Monocytes Relative: 8 % (ref 3.0–12.0)
NEUTROS ABS: 2.9 10*3/uL (ref 1.4–7.7)
NEUTROS PCT: 51.5 % (ref 43.0–77.0)
Platelets: 169 10*3/uL (ref 150.0–400.0)
RBC: 3.95 Mil/uL (ref 3.87–5.11)
RDW: 12.9 % (ref 11.5–15.5)
WBC: 5.7 10*3/uL (ref 4.0–10.5)

## 2013-08-18 LAB — URINALYSIS, ROUTINE W REFLEX MICROSCOPIC
BILIRUBIN URINE: NEGATIVE
Hgb urine dipstick: NEGATIVE
Ketones, ur: NEGATIVE
Leukocytes, UA: NEGATIVE
Nitrite: NEGATIVE
PH: 7 (ref 5.0–8.0)
RBC / HPF: NONE SEEN (ref 0–?)
Specific Gravity, Urine: 1.01 (ref 1.000–1.030)
TOTAL PROTEIN, URINE-UPE24: NEGATIVE
Urine Glucose: NEGATIVE
Urobilinogen, UA: 0.2 (ref 0.0–1.0)
WBC UA: NONE SEEN (ref 0–?)

## 2013-08-18 LAB — HEPATIC FUNCTION PANEL
ALT: 17 U/L (ref 0–35)
AST: 22 U/L (ref 0–37)
Albumin: 4.3 g/dL (ref 3.5–5.2)
Alkaline Phosphatase: 59 U/L (ref 39–117)
BILIRUBIN TOTAL: 0.3 mg/dL (ref 0.2–1.2)
Bilirubin, Direct: 0 mg/dL (ref 0.0–0.3)
Total Protein: 7.4 g/dL (ref 6.0–8.3)

## 2013-08-18 LAB — VITAMIN B12: VITAMIN B 12: 687 pg/mL (ref 211–911)

## 2013-08-18 LAB — TSH: TSH: 1.97 u[IU]/mL (ref 0.35–4.50)

## 2013-08-18 MED ORDER — ALPRAZOLAM 0.5 MG PO TABS: ORAL_TABLET | ORAL | Status: DC

## 2013-08-18 MED ORDER — LEVOTHYROXINE SODIUM 75 MCG PO TABS: 75.0000 ug | ORAL_TABLET | Freq: Every day | ORAL | Status: DC

## 2013-08-18 MED ORDER — ZOLPIDEM TARTRATE ER 6.25 MG PO TBCR: EXTENDED_RELEASE_TABLET | ORAL | Status: DC

## 2013-08-18 NOTE — Assessment & Plan Note (Addendum)

## 2013-08-18 NOTE — Assessment & Plan Note (Signed)
Ok for nutrition refereral

## 2013-08-18 NOTE — Patient Instructions (Addendum)
Please continue all other medications as before, and refills have been done if requested.  Please have the pharmacy call with any other refills you may need.  Please continue your efforts at being more active, low cholesterol diet, and weight control.  You are otherwise up to date with prevention measures today.  Please keep your appointments with your specialists as you may have planned  Please go to the XRAY Department in the Basement (go straight as you get off the elevator) for the x-ray testing  Please go to the LAB in the Basement (turn left off the elevator) for the tests to be done today  You will be contacted by phone if any changes need to be made immediately.  Otherwise, you will receive a letter about your results with an explanation, but please check with MyChart first.  Please remember to sign up for MyChart if you have not done so, as this will be important to you in the future with finding out test results, communicating by private email, and scheduling acute appointments online when needed.  You will be contacted regarding the referral for: Dr Smith/sport medicine, and nutrition  You should hear relatively soon about the prolia copay, as we have asked Deirdre Peer in the office to look into this  Please return in 6 months, or sooner if needed

## 2013-08-18 NOTE — Progress Notes (Signed)
Pre visit review using our clinic review tool, if applicable. No additional management support is needed unless otherwise documented below in the visit note. 

## 2013-08-18 NOTE — Telephone Encounter (Signed)
Pt called stated she spoke with [facility] about verification for prolia. Pt stated that they gave her this # for our office to call and get verify [phone].

## 2013-08-18 NOTE — Progress Notes (Signed)
Subjective:    Patient ID: [name], female    DOB: 1960/02/11, 53 y.o.   MRN: [id number]  HPI  Here for wellness and f/u;  Overall doing ok;  Pt denies CP, worsening SOB, DOE, wheezing, orthopnea, PND, worsening LE edema, palpitations, dizziness or syncope.  Pt denies neurological change such as new headache, facial or extremity weakness.  Pt denies polydipsia, polyuria, or low sugar symptoms. Pt states overall good compliance with treatment and medications, good tolerability, and has been trying to follow lower cholesterol diet.  Pt denies worsening depressive symptoms, suicidal ideation or panic. No fever, night sweats, wt loss, loss of appetite, or other constitutional symptoms.  Pt states good ability with ADL's, has low fall risk, home safety reviewed and adequate, no other significant changes in hearing or vision, and only occasionally active with exercise. Did have recent dxa per GYN with normal VIt d and PTH.  Has already fx hx of both feet and sacrum  Needs to start prolia.  Has been on fosamax, took for almost a yr, then became concerned regarding side effects so stopped.  Had recent steroid tx to right dequervains  Has several other msk issues, including recurring LBP, and right lateral hip tender, some radiation to the right groin. Asks for film.   Had levothyr changed frlom 50 to 75 about 1 mo ago.   Needs tx for recent finding osteoporosis per DXA per GYN.  Asks to see nutritionist. Right ear still ringing despite seeing ent/dr krause, suggeste stopping nsaid but needs for OA pain.  Canal cleared of wax.   Past Medical History  Diagnosis Date  . Hypothyroidism   . Anxiety and depression   . Allergic rhinitis   . IBS (irritable bowel syndrome)   . Hyperlipidemia   . Foot fracture, left     hx of  . Cyst of ovary     bilateral  . Anemia     history of anemia secondary to fibroid sx  . Low back pain   . Lumbar disc disease 07/20/2010  . Cervical disc disease 07/20/2010  .  Vitamin D deficiency 07/20/2010  . Iron deficiency 07/20/2010  . Osteoporosis 07/20/2010  . 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 Vaginal hysterectomy    . Right lateral epicondylar    . Fibroid sx    . Colonoscopy      reports that she has quit smoking. She does not have any smokeless tobacco history on file. She reports that she does not drink alcohol or use illicit drugs. family history includes Alcohol abuse in her father and mother; Heart attack in her father. Allergies  Allergen Reactions  . Sulfonamide Derivatives    Current Outpatient Prescriptions on File Prior to Visit  Medication Sig Dispense Refill  . ALPRAZolam (XANAX) 0.5 MG tablet TAKE 1 TABLET BY MOUTH EVERY DAY  30 tablet  5  . eletriptan (RELPAX) 40 MG tablet TAKE (1) TABLET DAILY AS NEEDED.  10 tablet  11  . nadolol (CORGARD) 20 MG tablet But can take additional 1/2 tablet (10 mg) if needed for PVC's  (up to a total of 30 mg daily)  90 tablet  3  . ondansetron (ZOFRAN ODT) 4 MG disintegrating tablet Take 1 tablet (4 mg total) by mouth every 8 (eight) hours as needed.  30 tablet  2  . propranolol (INDERAL) 10 MG tablet 1 DAILY AS NEEDED FOR PALPITATIONS.  30 tablet  2  .  valACYclovir (VALTREX) 1000 MG tablet Take 1 tablet (1,000 mg total) by mouth 2 (two) times daily.  10 tablet  0  . zolpidem (AMBIEN CR) 6.25 MG CR tablet TAKE 1 TABLET BY MOUTH AT BEDTIME AS NEEDED  30 tablet  2   No current facility-administered medications on file prior to visit.   Review of Systems Constitutional: Negative for increased diaphoresis, other activity, appetite or other siginficant weight change  HENT: Negative for worsening hearing loss, ear pain, facial swelling, mouth sores and neck stiffness.   Eyes: Negative for other worsening pain, redness or visual disturbance.  Respiratory: Negative for shortness of breath and wheezing.   Cardiovascular: Negative for chest pain and palpitations.    Gastrointestinal: Negative for diarrhea, blood in stool, abdominal distention or other pain Genitourinary: Negative for hematuria, flank pain or change in urine volume.  Musculoskeletal: Negative for myalgias or other joint complaints.  Skin: Negative for color change and wound.  Neurological: Negative for syncope and numbness. other than noted Hematological: Negative for adenopathy. or other swelling Psychiatric/Behavioral: Negative for hallucinations, self-injury, decreased concentration or other worsening agitation.      Objective:   Physical Exam BP 110/62  Pulse 69  Temp(Src) 98.8 F (37.1 C) (Oral)  Ht 5' 6.5" (1.689 m)  Wt 133 lb 8 oz (60.555 kg)  BMI 21.23 kg/m2  SpO2 94% VS noted,  Constitutional: Pt is oriented to person, place, and time. Appears well-developed and well-nourished.  Head: Normocephalic and atraumatic.  Right Ear: External ear normal.  Left Ear: External ear normal.  Nose: Nose normal.  Mouth/Throat: Oropharynx is clear and moist.  Eyes: Conjunctivae and EOM are normal. Pupils are equal, round, and reactive to light.  Neck: Normal range of motion. Neck supple. No JVD present. No tracheal deviation present.  Cardiovascular: Normal rate, regular rhythm, normal heart sounds and intact distal pulses.   Pulmonary/Chest: Effort normal and breath sounds without rales or wheezing  Abdominal: Soft. Bowel sounds are normal. NT. No HSM  Musculoskeletal: Normal range of motion. Exhibits no edema.  Lymphadenopathy:  Has no cervical adenopathy.  Neurological: Pt is alert and oriented to person, place, and time. Pt has normal reflexes. No cranial nerve deficit. Motor grossly intact Skin: Skin is warm and dry. No rash noted.  Psychiatric:  Has normal mood and affect. Behavior is normal.  Hand OA changes getting some worse,    Assessment & Plan:

## 2013-08-20 NOTE — Telephone Encounter (Signed)
Sent pt's info to [employer] for insurance verification. Will let you know as soon as I have a response. Thank you.

## 2013-08-22 NOTE — Assessment & Plan Note (Signed)
To look into prolia tx copay, pt interested in tx pending cost

## 2013-08-22 NOTE — Assessment & Plan Note (Signed)
Also for b12 test,  to f/u any worsening symptoms or concerns

## 2013-08-22 NOTE — Assessment & Plan Note (Signed)
For right hip film refer Dr Smith/[facility]

## 2013-08-22 NOTE — Assessment & Plan Note (Signed)
Mild to mod, for med refill - ambien,  to f/u any worsening symptoms or concern

## 2013-08-25 ENCOUNTER — Telehealth: Payer: Self-pay | Admitting: Internal Medicine

## 2013-08-25 NOTE — Telephone Encounter (Signed)
Rec'd pt's insurance verification for Prolia injection. Pt's estimated responsibility if an office visit is billed will be a $35 co-pay. If no office visit is billed her estimated responsibility is $0 co-pay. Please advise patient this is only an estimate. I have sent a copy of the summary of benefits to be scanned into pt's chart.  If you have any questions please let me know. Thank you.

## 2013-08-25 NOTE — Telephone Encounter (Signed)
For a nurse visit, that is correct, there will be no co-pay. Thank you.

## 2013-08-25 NOTE — Telephone Encounter (Signed)
Patient is requesting follow up call in regards to prolia

## 2013-08-25 NOTE — Telephone Encounter (Signed)
Pt is scheudle for nurse visit for prolia on 09/04/13, request for prolia to be order. From my understand that is that this pt only going to see the assistant for this injection (not the doctor office) she has no copay?

## 2013-09-04 ENCOUNTER — Ambulatory Visit (INDEPENDENT_AMBULATORY_CARE_PROVIDER_SITE_OTHER): Payer: 59

## 2013-09-04 DIAGNOSIS — M810 Age-related osteoporosis without current pathological fracture: Secondary | ICD-10-CM

## 2013-09-04 MED ORDER — DENOSUMAB 60 MG/ML ~~LOC~~ SOLN
60.0000 mg | Freq: Once | SUBCUTANEOUS | Status: AC
  Administered 2013-09-04: 60 mg via SUBCUTANEOUS

## 2013-09-09 ENCOUNTER — Ambulatory Visit (INDEPENDENT_AMBULATORY_CARE_PROVIDER_SITE_OTHER): Payer: 59 | Admitting: Family Medicine

## 2013-09-09 ENCOUNTER — Other Ambulatory Visit (INDEPENDENT_AMBULATORY_CARE_PROVIDER_SITE_OTHER): Payer: 59

## 2013-09-09 ENCOUNTER — Encounter: Payer: Self-pay | Admitting: Family Medicine

## 2013-09-09 VITALS — BP 117/74 | HR 61 | Ht 67.0 in | Wt 133.0 lb

## 2013-09-09 DIAGNOSIS — M810 Age-related osteoporosis without current pathological fracture: Secondary | ICD-10-CM

## 2013-09-09 DIAGNOSIS — M25551 Pain in right hip: Secondary | ICD-10-CM

## 2013-09-09 DIAGNOSIS — M76899 Other specified enthesopathies of unspecified lower limb, excluding foot: Secondary | ICD-10-CM

## 2013-09-09 DIAGNOSIS — M7060 Trochanteric bursitis, unspecified hip: Secondary | ICD-10-CM | POA: Insufficient documentation

## 2013-09-09 DIAGNOSIS — M25559 Pain in unspecified hip: Secondary | ICD-10-CM

## 2013-09-09 DIAGNOSIS — M7061 Trochanteric bursitis, right hip: Secondary | ICD-10-CM

## 2013-09-09 MED ORDER — DICLOFENAC SODIUM 2 % TD SOLN: TRANSDERMAL | Status: DC

## 2013-09-09 NOTE — Progress Notes (Signed)
[name] [address] [place], [place] [address] Phone: [phone] Subjective:    I'm seeing this patient by the request  of:  Cathlean Cower, MD  CC:  Right hip pain  [id number] [name] is a 54 y.o. female coming in with complaint of right hip pain. Patient is a past medical history significant for osteoporosis. Patient states that she's had this pain for quite some time. Patient states is mostly tender the lateral aspect. Can have some radiation into the right groin. Patient has been taking anti-inflammatories even though this causes ringing in her ears. Patient had been diagnosed with a greater trochanteric bursitis before and has had injections the last one greater than one year ago. Patient states it never seemed to completely resolve the pain. Denies any back pain associated with it.  Patient did have x-rays last week of the right hip that shows no bony abnormality.     Past medical history, social, surgical and family history all reviewed in electronic medical record.   Review of Systems: No headache, visual changes, nausea, vomiting, diarrhea, constipation, dizziness, abdominal pain, skin rash, fevers, chills, night sweats, weight loss, swollen lymph nodes, body aches, joint swelling, muscle aches, chest pain, shortness of breath, mood changes.   Objective Blood pressure 117/74, pulse 61, height 5\' 7"  (1.702 m), weight 133 lb (60.328 kg).  General: No apparent distress alert and oriented x3 mood and affect normal, dressed appropriately.  HEENT: Pupils equal, extraocular movements intact  Respiratory: Patient's speak in full sentences and does not appear short of breath  Cardiovascular: No lower extremity edema, non tender, no erythema  Skin: Warm dry intact with no signs of infection or rash on extremities or on axial skeleton.  Abdomen: Soft nontender  Neuro: Cranial nerves II through XII are intact, neurovascularly intact in all  extremities with 2+ DTRs and 2+ pulses.  Lymph: No lymphadenopathy of posterior or anterior cervical chain or axillae bilaterally.  Gait normal with good balance and coordination.  MSK:  Non tender with full range of motion and good stability and symmetric strength and tone of shoulders, elbows, wrist,  knee and ankles bilaterally.  Hip: Right ROM IR: 35 Deg, ER: 35 Deg, Flexion: 100 Deg, Extension: 80 Deg, Abduction: 45 Deg, Adduction: 35 Deg Strength IR: 5/5, ER: 5/5, Flexion: 5/5, Extension: 5/5, Abduction: 3/5, Adduction: 5/5 Pelvic alignment unremarkable to inspection and palpation. Standing hip rotation and gait without trendelenburg sign / unsteadiness. Greater trochanter with severe tenderness No tenderness over piriformis and greater trochanter. Positive Corky Sox been negative pain with internal rotation Negative stress test to the femur No SI joint tenderness and normal minimal SI movement.  MSK US performed of: Right hip This study was ordered, performed, and interpreted by Charlann Boxer D.O.  Hip: Moderate hypoechoic changes of the greater trochanteric bursa. Seems to have chronic thickening of the proximal ITB and. Acetabular labrum visualized and without tears, displacement, or effusion in joint. Femoral neck appears unremarkable without increased power doppler signal along Cortex.  IMPRESSION:  Greater trochanteric bursitis   Procedure: Real-time Ultrasound Guided Injection of right greater trochanteric bursitis secondary to patient's body habitus Device: GE Logiq E  Ultrasound guided injection is preferred based studies that show increased duration, increased effect, greater accuracy, decreased procedural pain, increased response rate, and decreased cost with ultrasound guided versus blind injection.  Verbal informed consent obtained.  Time-out conducted.  Noted no overlying erythema, induration, or other signs of local infection.  Skin prepped in a sterile fashion.    Local anesthesia: Topical Ethyl chloride.  With sterile technique and under real time ultrasound guidance:  Greater trochanteric area was visualized and patient's bursa was noted. A 22-gauge 3 inch needle was inserted and 4 cc of 0.5% Marcaine and 1 cc of Kenalog 40 mg/dL was injected. Pictures taken Completed without difficulty  Pain immediately resolved suggesting accurate placement of the medication.  Advised to call if fevers/chills, erythema, induration, drainage, or persistent bleeding.  Images permanently stored and available for review in the ultrasound unit.  Impression: Technically successful ultrasound guided injection.     Impression and Recommendations:     This case required medical decision making of moderate complexity.

## 2013-09-09 NOTE — Assessment & Plan Note (Signed)
Patient was given injection as stated above. Patient also given a prescription for topical anti-inflammatories to try and we'll avoid oral anti-inflammatories. We discussed an icing regimen K. patient hip abductor exercises I think strengthening will be helpful. I do think underlying osteoporosis could be also contributing. Patient will try these interventions and come back and see me again in 3 further evaluation and treatment.

## 2013-09-09 NOTE — Patient Instructions (Signed)
Very nice to meet you Ice 20 minutes 2 times daily. Usually after activity and before bed. Exercises 3 times a week.  Exercises on wall.  Heel and butt touching.  Raise leg 6 inches and hold 2 seconds.  Down slow for count of 4 seconds.  1 set of 30 reps daily on both sides.  Add in weight lifting 2 times a week.  Glucosamine sulfate 750mg  twice a day is a supplement that has been shown to help moderate to severe arthritis. Vitamin D 2000 IU daily and look for one that has K2 in it may be helpful.  Fish oil 2 grams daily.  Tumeric 500mg  twice daily.  Capsicin 2 times daily can help as well.  Come back in 3 weeks.

## 2013-09-09 NOTE — Assessment & Plan Note (Signed)
Patient does have severe osteoporosis for someone of her age. Discussed with patient in great length about over-the-counter medications that could be beneficial. Discussed the importance of protein supplementation could also be helpful. We discussed also weight training and how this can actually help her bone mineral density as well. Patient will continue with the prolia. Patient will come back again and see me in 3 weeks for further evaluation and treatment.

## 2013-10-05 ENCOUNTER — Ambulatory Visit: Payer: 59 | Admitting: Dietician

## 2013-12-02 ENCOUNTER — Telehealth: Payer: Self-pay | Admitting: Internal Medicine

## 2013-12-02 NOTE — Telephone Encounter (Signed)
Pt said she had Prolia shot 09/04/13. Pt is wondering if her 3 wks of severe muscle,joint,hip,neck,shoulder pain could be a side effect. ( Pt wanted to let Dr Jenny Reichmann know). Pt exercises daily but this is almost delibating at this point the pain. Could this be a side effect from the prolia? Does she need OV?

## 2014-02-05 HISTORY — PX: TRIGGER FINGER RELEASE: SHX641

## 2014-02-09 ENCOUNTER — Encounter: Payer: Self-pay | Admitting: Internal Medicine

## 2014-02-09 ENCOUNTER — Ambulatory Visit (INDEPENDENT_AMBULATORY_CARE_PROVIDER_SITE_OTHER): Payer: 59 | Admitting: Internal Medicine

## 2014-02-09 ENCOUNTER — Telehealth: Payer: Self-pay | Admitting: Internal Medicine

## 2014-02-09 ENCOUNTER — Telehealth: Payer: Self-pay

## 2014-02-09 ENCOUNTER — Other Ambulatory Visit (INDEPENDENT_AMBULATORY_CARE_PROVIDER_SITE_OTHER): Payer: 59

## 2014-02-09 VITALS — BP 120/82 | HR 79 | Temp 99.1°F | Ht 67.0 in | Wt 129.0 lb

## 2014-02-09 DIAGNOSIS — R5383 Other fatigue: Secondary | ICD-10-CM

## 2014-02-09 DIAGNOSIS — E039 Hypothyroidism, unspecified: Secondary | ICD-10-CM

## 2014-02-09 DIAGNOSIS — J069 Acute upper respiratory infection, unspecified: Secondary | ICD-10-CM

## 2014-02-09 LAB — BASIC METABOLIC PANEL
BUN: 20 mg/dL (ref 6–23)
CO2: 30 meq/L (ref 19–32)
CREATININE: 0.6 mg/dL (ref 0.4–1.2)
Calcium: 9.8 mg/dL (ref 8.4–10.5)
Chloride: 103 mEq/L (ref 96–112)
GFR: 110.6 mL/min (ref >60.00)
Glucose, Bld: 98 mg/dL (ref 70–99)
Potassium: 3.8 mEq/L (ref 3.5–5.1)
SODIUM: 140 meq/L (ref 135–145)

## 2014-02-09 LAB — CBC WITH DIFFERENTIAL/PLATELET
BASOS PCT: 0.5 % (ref 0.0–3.0)
Basophils Absolute: 0 10*3/uL (ref 0.0–0.1)
EOS PCT: 2.2 % (ref 0.0–5.0)
Eosinophils Absolute: 0.1 10*3/uL (ref 0.0–0.7)
HCT: 34.8 % — ABNORMAL LOW (ref 36.0–46.0)
HEMOGLOBIN: 11.5 g/dL — AB (ref 12.0–15.0)
LYMPHS PCT: 29.1 % (ref 12.0–46.0)
Lymphs Abs: 2 10*3/uL (ref 0.7–4.0)
MCHC: 33 g/dL (ref 30.0–36.0)
MCV: 92 fl (ref 78.0–100.0)
MONO ABS: 0.6 10*3/uL (ref 0.1–1.0)
Monocytes Relative: 9.4 % (ref 3.0–12.0)
NEUTROS ABS: 4 10*3/uL (ref 1.4–7.7)
NEUTROS PCT: 58.8 % (ref 43.0–77.0)
Platelets: 213 10*3/uL (ref 150.0–400.0)
RBC: 3.79 Mil/uL — ABNORMAL LOW (ref 3.87–5.11)
RDW: 12.6 % (ref 11.5–15.5)
WBC: 6.7 10*3/uL (ref 4.0–10.5)

## 2014-02-09 LAB — URINALYSIS, ROUTINE W REFLEX MICROSCOPIC
Bilirubin Urine: NEGATIVE
Hgb urine dipstick: NEGATIVE
KETONES UR: NEGATIVE
Leukocytes, UA: NEGATIVE
NITRITE: NEGATIVE
PH: 5.5 (ref 5.0–8.0)
RBC / HPF: NONE SEEN (ref 0–?)
Specific Gravity, Urine: 1.025 (ref 1.000–1.030)
TOTAL PROTEIN, URINE-UPE24: NEGATIVE
Urine Glucose: NEGATIVE
Urobilinogen, UA: 0.2 (ref 0.0–1.0)
WBC, UA: NONE SEEN (ref 0–?)

## 2014-02-09 LAB — HEPATIC FUNCTION PANEL
ALBUMIN: 4.2 g/dL (ref 3.5–5.2)
ALT: 13 U/L (ref 0–35)
AST: 16 U/L (ref 0–37)
Alkaline Phosphatase: 75 U/L (ref 39–117)
BILIRUBIN TOTAL: 0.3 mg/dL (ref 0.2–1.2)
Bilirubin, Direct: 0 mg/dL (ref 0.0–0.3)
Total Protein: 7.5 g/dL (ref 6.0–8.3)

## 2014-02-09 LAB — TSH: TSH: 0.05 u[IU]/mL — AB (ref 0.35–4.50)

## 2014-02-09 LAB — T4, FREE: Free T4: 1.8 ng/dL — ABNORMAL HIGH (ref 0.60–1.60)

## 2014-02-09 NOTE — Telephone Encounter (Signed)
Patient is due her next Prolia Injection the end of January 2016.Marland Kitchen Let me know once completed the insurance information and I can let her know her cost.

## 2014-02-09 NOTE — Progress Notes (Signed)
Subjective:    Patient ID: [name], female    DOB: 10-Aug-1960, 54 y.o.   MRN: [id number]  HPI  Here to f/u, lives in [place] now near [place], mentions long drive for this eval, husband had to drive her.  Here with 3 wks ST, HA,right post neck pain.  Took 'extra" antibx at home, still with fever, had fever blister, lips peeling, had right post neck pain, radiating to the ant neck, everything seems swollen and c/o mild sob, and fatigue, general weakness, had to go home with shopping yesterday, also with recurrent dizzy spells 3-4 time per day.  Saw Dr Valentina Shaggy but symptoms not felt to be related specifically to her thyroid, thyroid med now at 75 mcg after recent increase.  Saw also ENT Dr Ernesto Rutherford - tx with clindamycin but no help (and another antibiotic prior to that)  Fever staying some better with tylenol.  Has recent exposure to grandchild, but not sick it seems. Denies worsening depressive symptoms, suicidal ideation, or panic; has ongoing anxiety. Past Medical History  Diagnosis Date  . Hypothyroidism   . Anxiety and depression   . Allergic rhinitis   . IBS (irritable bowel syndrome)   . Hyperlipidemia   . Foot fracture, left     hx of  . Cyst of ovary     bilateral  . Anemia     history of anemia secondary to fibroid sx  . Low back pain   . Lumbar disc disease 07/20/2010  . Cervical disc disease 07/20/2010  . Vitamin D deficiency 07/20/2010  . Iron deficiency 07/20/2010  . Osteoporosis 07/20/2010  . 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 Vaginal hysterectomy    . Right lateral epicondylar    . Fibroid sx    . Colonoscopy      reports that she has quit smoking. She does not have any smokeless tobacco history on file. She reports that she does not drink alcohol or use illicit drugs. family history includes Alcohol abuse in her father and mother; Heart attack in her father. Allergies  Allergen Reactions  . Sulfonamide  Derivatives    Current Outpatient Prescriptions on File Prior to Visit  Medication Sig Dispense Refill  . ALPRAZolam (XANAX) 0.5 MG tablet TAKE 1 TABLET BY MOUTH EVERY DAY 30 tablet 5  . Diclofenac Sodium 2 % SOLN Apply twice daily. 112 g 1  . eletriptan (RELPAX) 40 MG tablet TAKE (1) TABLET DAILY AS NEEDED. 10 tablet 11  . levothyroxine (SYNTHROID, LEVOTHROID) 75 MCG tablet Take 1 tablet (75 mcg total) by mouth daily. 90 tablet 3  . nadolol (CORGARD) 20 MG tablet But can take additional 1/2 tablet (10 mg) if needed for PVC's  (up to a total of 30 mg daily) 90 tablet 3  . ondansetron (ZOFRAN ODT) 4 MG disintegrating tablet Take 1 tablet (4 mg total) by mouth every 8 (eight) hours as needed. 30 tablet 2  . propranolol (INDERAL) 10 MG tablet 1 DAILY AS NEEDED FOR PALPITATIONS. 30 tablet 2  . valACYclovir (VALTREX) 1000 MG tablet Take 1 tablet (1,000 mg total) by mouth 2 (two) times daily. 10 tablet 0  . zolpidem (AMBIEN CR) 6.25 MG CR tablet TAKE 1 TABLET BY MOUTH AT BEDTIME AS NEEDED 30 tablet 5   No current facility-administered medications on file prior to visit.   Review of Systems  Constitutional: Negative for unusual diaphoresis or other sweats  HENT: Negative for ringing in  ear Eyes: Negative for double vision or worsening visual disturbance.  Respiratory: Negative for choking and stridor.   Gastrointestinal: Negative for vomiting or other signifcant bowel change Genitourinary: Negative for hematuria or decreased urine volume.  Musculoskeletal: Negative for other MSK pain or swelling Skin: Negative for color change and worsening wound.  Neurological: Negative for tremors and numbness other than noted  Psychiatric/Behavioral: Negative for decreased concentration or agitation other than above       Objective:   Physical Exam BP 120/82 mmHg  Pulse 79  Temp(Src) 99.1 F (37.3 C) (Oral)  Ht 5\' 7"  (1.702 m)  Wt 129 lb (58.514 kg)  BMI 20.20 kg/m2  SpO2 93% VS noted,    Constitutional: Pt appears well-developed, well-nourished.  HENT: Head: NCAT.  Right Ear: External ear normal.  Left Ear: External ear normal.  Bilat tm's with mild erythema.  Max sinus areas non tender.  Pharynx with mild erythema, no exudate Eyes: . Pupils are equal, round, and reactive to light. Conjunctivae and EOM are normal Neck: Normal range of motion. Neck supple. Has a tender right mid preSCM node noted Cardiovascular: Normal rate and regular rhythm.   Pulmonary/Chest: Effort normal and breath sounds without rales or wheezing.  Abd:  Soft, NT, ND, + BS Neurological: Pt is alert. Not confused , motor grossly intact Skin: Skin is warm. No rash Psychiatric: Pt behavior is normal. No agitation. 2+ nervous    Assessment & Plan:

## 2014-02-09 NOTE — Progress Notes (Signed)
Pre visit review using our clinic review tool, if applicable. No additional management support is needed unless otherwise documented below in the visit note. 

## 2014-02-09 NOTE — Telephone Encounter (Signed)
I have electronically sent pt's info for Prolia insurance verification and will notify you once I have a response. Thank you. °

## 2014-02-09 NOTE — Patient Instructions (Signed)
Please continue all other medications as before  Please have the pharmacy call with any other refills you may need.  Please keep your appointments with your specialists as you may have planned  Please go to the LAB in the Basement (turn left off the elevator) for the tests to be done today  You will be contacted by phone if any changes need to be made immediately.  Otherwise, you will receive a letter about your results with an explanation, but please check with MyChart first.

## 2014-02-10 ENCOUNTER — Other Ambulatory Visit (INDEPENDENT_AMBULATORY_CARE_PROVIDER_SITE_OTHER): Payer: 59

## 2014-02-10 ENCOUNTER — Encounter: Payer: Self-pay | Admitting: Internal Medicine

## 2014-02-10 DIAGNOSIS — E039 Hypothyroidism, unspecified: Secondary | ICD-10-CM

## 2014-02-10 DIAGNOSIS — D62 Acute posthemorrhagic anemia: Secondary | ICD-10-CM | POA: Diagnosis not present

## 2014-02-10 LAB — CMV IGM

## 2014-02-10 LAB — IBC PANEL
IRON: 30 ug/dL — AB (ref 42–145)
Saturation Ratios: 9.3 % — ABNORMAL LOW (ref 20.0–50.0)
Transferrin: 231.3 mg/dL (ref 212.0–360.0)

## 2014-02-10 LAB — CYTOMEGALOVIRUS ANTIBODY, IGG: Cytomegalovirus Ab-IgG: >10 U/mL — ABNORMAL HIGH (ref <0.60)

## 2014-02-11 ENCOUNTER — Telehealth: Payer: Self-pay

## 2014-02-11 ENCOUNTER — Telehealth: Payer: Self-pay | Admitting: Internal Medicine

## 2014-02-11 DIAGNOSIS — D509 Iron deficiency anemia, unspecified: Secondary | ICD-10-CM

## 2014-02-11 MED ORDER — LEVOTHYROXINE SODIUM 50 MCG PO TABS: 50.0000 ug | ORAL_TABLET | ORAL | Status: DC

## 2014-02-11 MED ORDER — LEVOTHYROXINE SODIUM 75 MCG PO TABS: 75.0000 ug | ORAL_TABLET | ORAL | Status: DC

## 2014-02-11 NOTE — Assessment & Plan Note (Signed)
Etiology unlcear, to check cbc, o/w Etiology unclear, Exam otherwise benign, to check labs as documented, follow with expectant management

## 2014-02-11 NOTE — Assessment & Plan Note (Signed)
Mild to mod, likely viral, overall mild, for tylenol prn,  to f/u any worsening symptoms or concerns

## 2014-02-11 NOTE — Telephone Encounter (Signed)
-----   Message from Biagio Borg, MD sent at 02/11/2014 12:33 PM EST ----- This is from one of the messages this am , and has already been sent on Mychart  All of the viral testing is not yet resulted. It does seem clinically that you do have some sort of viral infection, and I suppose it might be good to stay away from the grandaughter for now. Also I was going to have Kimara Bencomo to call you, but it appears you are taking too much thyroid medication, as the TSH was low, and the Free t4 was high. I am assuming you were low when taking the 50 mcg, so we should change this to taking 50 every other day, and 75 every other day, with a followup TSH/free t4 at one month. Remember, too much thyroid medication can also increase anxiety. You do also have a new mild anemia from the last time it was checked, and I have asked the Lab to addon an Iron panel, so this is pending as well. When we get more results, we can let you know  I will do rx for qod 50/qod 75 mcg levothyroxine, and add labs for thryoid in 1 month

## 2014-02-11 NOTE — Telephone Encounter (Signed)
Robin to inform pt   Also to start OTC iron sulfate 325 mg - 1 per day, until further notice

## 2014-02-11 NOTE — Telephone Encounter (Signed)
Robin to inform pt  All of the viral testing is not yet resulted.  It does seem clinically that you do have some sort of viral infection, and I suppose it might be good to stay away from the grandaughter for now.     Also I was going to have robin to call you, but it appears you are taking too much thyroid medication, as the TSH was low, and the Free t4 was high.  I am assuming you were low when taking the 50 mcg, so we should change this to taking 50 every other day, and 75 every other day, with a followup TSH/free t4 at one month.  Remember, too much thyroid medication can also increase anxiety.  You do also have a new mild anemia from the last time it was checked, and I have asked the Lab to addon an Iron panel, so this is pending as well.  When we get more results, we can let you know  I will do rx for qod 50/qod 75 mcg levothyroxine, and add labs for thryoid in 1 month  Med rx sent to [facility]

## 2014-02-11 NOTE — Telephone Encounter (Signed)
Called the patient informed of all information per PCP instructions.

## 2014-02-11 NOTE — Assessment & Plan Note (Signed)
?   More nervous recently, recent thyroid med increased, also for tsh/free t4 per pt request

## 2014-02-11 NOTE — Telephone Encounter (Signed)
Patient informed. 

## 2014-02-16 ENCOUNTER — Encounter: Payer: Self-pay | Admitting: Internal Medicine

## 2014-02-25 NOTE — Telephone Encounter (Signed)
Patient informed of all information regarding her cost... She did agree to call back and schedule a nurse visit for the Prolia the end of January.  Will contact Lakisha to order for her

## 2014-02-25 NOTE — Telephone Encounter (Signed)
I have rec'd pt's Prolia insurance verification. If an OV is billed pt will have an estimated responsibility of $25, which will cover the OV and Prolia; if no OV is billed pt will have an estimated responsibility of $0. Please advise pt this is an estimate and we will not know an exact amt until her insurance has paid. I have sent a copy of the summary of benefits to be scanned into pt's chart. If you have any questions, please let me know. Thank you.

## 2014-03-01 ENCOUNTER — Encounter: Payer: Self-pay | Admitting: Internal Medicine

## 2014-03-02 ENCOUNTER — Telehealth: Payer: Self-pay

## 2014-03-02 NOTE — Telephone Encounter (Signed)
The respiratory virus panel and the [name] virus VC were not done as lab needed sputum and never received from the patient.  Does the patient need to be called to return to the lab for these test and instructions.  They would need early morning sputum.

## 2014-03-02 NOTE — Telephone Encounter (Signed)
Ok to let pt know, if she is not having significant sputum, then we should cancel the tests

## 2014-03-02 NOTE — Telephone Encounter (Signed)
Patient informed and states she does not want to do the test as has no congestion.  The patient stated she still has a sore throat and not sure if she is still contagious, when coughing throat hurts.  She would like to know what is actually wrong with her.   She is under a lot of stresss, low grade fever 99.7??

## 2014-03-02 NOTE — Telephone Encounter (Signed)
Robin to call lab please  Maybe specimen was not ordered correctly?

## 2014-03-02 NOTE — Telephone Encounter (Signed)
OK to cancel the sputum testing  But I really dont have a good idea otherwise as what could be causing ST and fever. i could refer to ENT near where she lives if she wants

## 2014-03-03 NOTE — Telephone Encounter (Signed)
Patient informed and already saw a ENT

## 2014-03-09 ENCOUNTER — Ambulatory Visit (INDEPENDENT_AMBULATORY_CARE_PROVIDER_SITE_OTHER): Payer: 59

## 2014-03-09 DIAGNOSIS — M810 Age-related osteoporosis without current pathological fracture: Secondary | ICD-10-CM

## 2014-03-09 MED ORDER — DENOSUMAB 60 MG/ML ~~LOC~~ SOLN
60.0000 mg | Freq: Once | SUBCUTANEOUS | Status: AC
  Administered 2014-03-09: 60 mg via SUBCUTANEOUS

## 2014-03-30 ENCOUNTER — Ambulatory Visit: Payer: 59 | Admitting: Gastroenterology

## 2014-04-22 ENCOUNTER — Other Ambulatory Visit: Payer: Self-pay | Admitting: Internal Medicine

## 2014-04-25 ENCOUNTER — Encounter: Payer: Self-pay | Admitting: Internal Medicine

## 2014-04-27 MED ORDER — ALPRAZOLAM 0.5 MG PO TABS: ORAL_TABLET | ORAL | Status: DC

## 2014-04-27 MED ORDER — ZOLPIDEM TARTRATE ER 6.25 MG PO TBCR: EXTENDED_RELEASE_TABLET | ORAL | Status: DC

## 2014-04-27 MED ORDER — ONDANSETRON 4 MG PO TBDP: 4.0000 mg | ORAL_TABLET | Freq: Three times a day (TID) | ORAL | Status: DC | PRN

## 2014-04-27 NOTE — Telephone Encounter (Signed)
Xanax, zofran, ambien -= Done hardcopy to [employer] [facility]

## 2014-04-27 NOTE — Telephone Encounter (Signed)
Rx done. 

## 2014-05-25 HISTORY — PX: WRIST FRACTURE SURGERY: SHX121

## 2014-06-22 ENCOUNTER — Telehealth: Payer: Self-pay | Admitting: Internal Medicine

## 2014-06-22 NOTE — Telephone Encounter (Signed)
Please advise 

## 2014-06-22 NOTE — Telephone Encounter (Signed)
Patient fell and broke her wrist about 4 weeks ago and her ulna bone is not healing well and her doctor suggested she put her prolia injection on hold because it's known to slow healing. She would like Dr. Gwynn Burly input on this. Can you please call her.

## 2014-06-22 NOTE — Telephone Encounter (Signed)
Ok to let pt know we can look into prolia for her  I will ask Rose brewer to help determine the copay, and hopefully pt will hear soon

## 2014-06-23 ENCOUNTER — Other Ambulatory Visit: Payer: Self-pay

## 2014-06-23 ENCOUNTER — Other Ambulatory Visit (INDEPENDENT_AMBULATORY_CARE_PROVIDER_SITE_OTHER): Payer: 59

## 2014-06-23 DIAGNOSIS — E559 Vitamin D deficiency, unspecified: Secondary | ICD-10-CM

## 2014-06-23 LAB — VITAMIN D 25 HYDROXY (VIT D DEFICIENCY, FRACTURES): VITD: 41.63 ng/mL (ref 30.00–100.00)

## 2014-06-23 NOTE — Telephone Encounter (Signed)
Sorry, I didn't read the original message correctly  We can hold on verification for now. thanks

## 2014-06-23 NOTE — Telephone Encounter (Signed)
I will run the verification if you want, but it appears that her orthopedic physician wants to discontinue for now due to slow healing.  Also, patient just had her Prolia injection 03/09/2014 so she's not due for another one until 09/07/2014.  Please advise if you want me to run her verification. Thank you.

## 2014-06-23 NOTE — Telephone Encounter (Signed)
Pt is concerned that her Vit D levels are low and is requesting labs to have this checked, please advise

## 2014-06-30 ENCOUNTER — Encounter: Payer: Self-pay | Admitting: Gastroenterology

## 2014-07-17 ENCOUNTER — Other Ambulatory Visit: Payer: Self-pay | Admitting: Internal Medicine

## 2014-07-19 ENCOUNTER — Other Ambulatory Visit: Payer: Self-pay | Admitting: Internal Medicine

## 2014-08-06 DIAGNOSIS — Z8781 Personal history of (healed) traumatic fracture: Secondary | ICD-10-CM | POA: Insufficient documentation

## 2014-08-06 DIAGNOSIS — Z9889 Other specified postprocedural states: Secondary | ICD-10-CM | POA: Insufficient documentation

## 2014-08-31 ENCOUNTER — Telehealth: Payer: Self-pay | Admitting: Internal Medicine

## 2014-08-31 NOTE — Telephone Encounter (Signed)
Patient would like to know when her next prolia shot.

## 2014-09-01 NOTE — Telephone Encounter (Signed)
Next prolia is in August.

## 2014-09-01 NOTE — Telephone Encounter (Signed)
I scheduled her for 8/2

## 2014-09-07 ENCOUNTER — Ambulatory Visit: Payer: 59

## 2014-09-07 ENCOUNTER — Telehealth: Payer: Self-pay

## 2014-09-07 NOTE — Telephone Encounter (Signed)
Patient has recently fx her left wrist (radius and ulnar), and is using bone stimulator---patient also has osteoperosis, and is wanting to know if she can add Vitamin D and Calcium supplements to help with healing---and, if so, what dosage do you recommend?  Please advise, i will call her back, thanks

## 2014-09-07 NOTE — Telephone Encounter (Signed)
Patient advised, she repeated back for understanding

## 2014-09-07 NOTE — Telephone Encounter (Signed)
Ok for oscal plus 3 500/600  Tid with meals

## 2014-09-08 ENCOUNTER — Telehealth: Payer: Self-pay

## 2014-09-08 ENCOUNTER — Telehealth: Payer: Self-pay | Admitting: Internal Medicine

## 2014-09-08 NOTE — Telephone Encounter (Signed)
Staff message sent to rose brewer to ask about prolia shot due now

## 2014-09-08 NOTE — Telephone Encounter (Signed)
I have electronically submitted pt's info for Prolia insurance verification and will notify you once I have a response. Thank you. °

## 2014-09-14 ENCOUNTER — Ambulatory Visit (INDEPENDENT_AMBULATORY_CARE_PROVIDER_SITE_OTHER): Payer: 59 | Admitting: Geriatric Medicine

## 2014-09-14 ENCOUNTER — Telehealth: Payer: Self-pay | Admitting: Internal Medicine

## 2014-09-14 DIAGNOSIS — M810 Age-related osteoporosis without current pathological fracture: Secondary | ICD-10-CM

## 2014-09-14 MED ORDER — DENOSUMAB 60 MG/ML ~~LOC~~ SOLN
60.0000 mg | Freq: Once | SUBCUTANEOUS | Status: AC
  Administered 2014-09-14: 60 mg via SUBCUTANEOUS

## 2014-09-14 NOTE — Telephone Encounter (Signed)
Patient has scheduled nurse visit today at 11;30

## 2014-09-14 NOTE — Telephone Encounter (Signed)
I have rec'd pt's insurance verification for Prolia.  If an OV is billed pt will have an estimated responsibility of a $25 co-pay which includes the admin and cost of Prolia; if no OV is billed then admin and cost of Prolia are covered at 100% for an estimated pt responsibility of $0.  Please advise the pt this is an estimate and we will not know an exact amt until her insurance has paid.  I have sent a copy of the summary of benefits to be scanned into her chart.    Please send me pt's actual injection date once she rec's so I can update the Prolia portal.  If you haven any questions, please let me know. Thank you.

## 2014-09-14 NOTE — Telephone Encounter (Signed)
i have talked with patient and advised her of note from rose brewer, she is wanting to schedule nurse visit (not attached to office visit)---keisha advised to order prolia---will need to call patient back and schedule nurse visit after prolia arrives per patient request

## 2014-09-14 NOTE — Telephone Encounter (Signed)
Left message asking patient to call [name]----when patient calls back, she needs to talk with [name]

## 2014-09-14 NOTE — Telephone Encounter (Signed)
Patient has scheduled a prolia on 03/17/2015. I know it is far too early for verification, but she insisted to have something on the books.

## 2014-09-15 NOTE — Telephone Encounter (Signed)
Per email from [employer], injection rec'd 09/14/2014

## 2014-09-15 NOTE — Telephone Encounter (Signed)
I have noted and will run verification ahead of time. Thank you for letting me know.

## 2014-10-07 ENCOUNTER — Ambulatory Visit (INDEPENDENT_AMBULATORY_CARE_PROVIDER_SITE_OTHER): Payer: 59 | Admitting: Internal Medicine

## 2014-10-07 ENCOUNTER — Encounter: Payer: Self-pay | Admitting: Internal Medicine

## 2014-10-07 ENCOUNTER — Other Ambulatory Visit (INDEPENDENT_AMBULATORY_CARE_PROVIDER_SITE_OTHER): Payer: 59

## 2014-10-07 VITALS — BP 116/64 | HR 53 | Temp 98.2°F | Ht 67.0 in | Wt 133.0 lb

## 2014-10-07 DIAGNOSIS — Z0000 Encounter for general adult medical examination without abnormal findings: Secondary | ICD-10-CM

## 2014-10-07 DIAGNOSIS — H9191 Unspecified hearing loss, right ear: Secondary | ICD-10-CM

## 2014-10-07 DIAGNOSIS — D509 Iron deficiency anemia, unspecified: Secondary | ICD-10-CM | POA: Diagnosis not present

## 2014-10-07 DIAGNOSIS — Z23 Encounter for immunization: Secondary | ICD-10-CM

## 2014-10-07 DIAGNOSIS — H9190 Unspecified hearing loss, unspecified ear: Secondary | ICD-10-CM | POA: Insufficient documentation

## 2014-10-07 LAB — URINALYSIS, ROUTINE W REFLEX MICROSCOPIC
BILIRUBIN URINE: NEGATIVE
Hgb urine dipstick: NEGATIVE
KETONES UR: NEGATIVE
LEUKOCYTES UA: NEGATIVE
Nitrite: NEGATIVE
RBC / HPF: NONE SEEN (ref 0–?)
SPECIFIC GRAVITY, URINE: 1.02 (ref 1.000–1.030)
Total Protein, Urine: NEGATIVE
UROBILINOGEN UA: 0.2 (ref 0.0–1.0)
Urine Glucose: NEGATIVE
pH: 7 (ref 5.0–8.0)

## 2014-10-07 LAB — CBC WITH DIFFERENTIAL/PLATELET
BASOS ABS: 0 10*3/uL (ref 0.0–0.1)
Basophils Relative: 0.6 % (ref 0.0–3.0)
EOS PCT: 3.8 % (ref 0.0–5.0)
Eosinophils Absolute: 0.2 10*3/uL (ref 0.0–0.7)
HEMATOCRIT: 35.8 % — AB (ref 36.0–46.0)
Hemoglobin: 12.1 g/dL (ref 12.0–15.0)
LYMPHS ABS: 2.3 10*3/uL (ref 0.7–4.0)
LYMPHS PCT: 48.2 % — AB (ref 12.0–46.0)
MCHC: 33.8 g/dL (ref 30.0–36.0)
MCV: 92.8 fl (ref 78.0–100.0)
MONOS PCT: 8.6 % (ref 3.0–12.0)
Monocytes Absolute: 0.4 10*3/uL (ref 0.1–1.0)
NEUTROS ABS: 1.8 10*3/uL (ref 1.4–7.7)
NEUTROS PCT: 38.8 % — AB (ref 43.0–77.0)
PLATELETS: 155 10*3/uL (ref 150.0–400.0)
RBC: 3.85 Mil/uL — ABNORMAL LOW (ref 3.87–5.11)
RDW: 13.1 % (ref 11.5–15.5)
WBC: 4.8 10*3/uL (ref 4.0–10.5)

## 2014-10-07 LAB — LIPID PANEL
Cholesterol: 201 mg/dL — ABNORMAL HIGH (ref 0–200)
HDL: 88.7 mg/dL (ref >39.00)
LDL Cholesterol: 103 mg/dL — ABNORMAL HIGH (ref 0–99)
NONHDL: 111.83
Total CHOL/HDL Ratio: 2
Triglycerides: 45 mg/dL (ref 0.0–149.0)
VLDL: 9 mg/dL (ref 0.0–40.0)

## 2014-10-07 LAB — VITAMIN D 25 HYDROXY (VIT D DEFICIENCY, FRACTURES): VITD: 44.62 ng/mL (ref 30.00–100.00)

## 2014-10-07 LAB — HEPATIC FUNCTION PANEL
ALBUMIN: 4.3 g/dL (ref 3.5–5.2)
ALT: 13 U/L (ref 0–35)
AST: 18 U/L (ref 0–37)
Alkaline Phosphatase: 37 U/L — ABNORMAL LOW (ref 39–117)
BILIRUBIN DIRECT: 0.1 mg/dL (ref 0.0–0.3)
TOTAL PROTEIN: 7 g/dL (ref 6.0–8.3)
Total Bilirubin: 0.5 mg/dL (ref 0.2–1.2)

## 2014-10-07 LAB — IBC PANEL
IRON: 87 ug/dL (ref 42–145)
Saturation Ratios: 26.4 % (ref 20.0–50.0)
TRANSFERRIN: 235 mg/dL (ref 212.0–360.0)

## 2014-10-07 LAB — BASIC METABOLIC PANEL
BUN: 20 mg/dL (ref 6–23)
CALCIUM: 8.8 mg/dL (ref 8.4–10.5)
CO2: 28 meq/L (ref 19–32)
CREATININE: 0.62 mg/dL (ref 0.40–1.20)
Chloride: 109 mEq/L (ref 96–112)
GFR: 106.24 mL/min (ref >60.00)
Glucose, Bld: 83 mg/dL (ref 70–99)
Potassium: 4 mEq/L (ref 3.5–5.1)
Sodium: 142 mEq/L (ref 135–145)

## 2014-10-07 LAB — TSH: TSH: 6.39 u[IU]/mL — ABNORMAL HIGH (ref 0.35–4.50)

## 2014-10-07 MED ORDER — ELETRIPTAN HYDROBROMIDE 40 MG PO TABS: 40.0000 mg | ORAL_TABLET | Freq: Every day | ORAL | Status: DC | PRN

## 2014-10-07 NOTE — Progress Notes (Signed)
Pre visit review using our clinic review tool, if applicable. No additional management support is needed unless otherwise documented below in the visit note. 

## 2014-10-07 NOTE — Assessment & Plan Note (Addendum)

## 2014-10-07 NOTE — Patient Instructions (Signed)
Please continue all other medications as before, and refills have been done if requested.  Please have the pharmacy call with any other refills you may need.  Please continue your efforts at being more active, low cholesterol diet, and weight control.  You are otherwise up to date with prevention measures today.  Please keep your appointments with your specialists as you may have planned  You will be contacted regarding the referral for: Dr Juanell Fairly  Please go to the LAB in the Basement (turn left off the elevator) for the tests to be done today  You will be contacted by phone if any changes need to be made immediately.  Otherwise, you will receive a letter about your results with an explanation, but please check with MyChart first.  Please remember to sign up for MyChart if you have not done so, as this will be important to you in the future with finding out test results, communicating by private email, and scheduling acute appointments online when needed.  Please return in 1 year for your yearly visit, or sooner if needed, with Lab testing done 3-5 days before

## 2014-10-07 NOTE — Progress Notes (Signed)
Subjective:    Patient ID: [name], female    DOB: [birth year], 54 y.o.   MRN: [id number]  HPI  Here for wellness and f/u;  Overall doing ok;  Pt denies Chest pain, worsening SOB, DOE, wheezing, orthopnea, PND, worsening LE edema, palpitations, dizziness or syncope.  Pt denies neurological change such as new headache, facial or extremity weakness.  Pt denies polydipsia, polyuria, or low sugar symptoms. Pt states overall good compliance with treatment and medications, good tolerability, and has been trying to follow appropriate diet.  Pt denies worsening depressive symptoms, suicidal ideation or panic. No fever, night sweats, wt loss, loss of appetite, or other constitutional symptoms.  Pt states good ability with ADL's, has low fall risk, home safety reviewed and adequate, no other significant changes in hearing or vision, and only occasionally active with exercise.  S/p left wrist fx with fall at home, s/p surgury but has ulna head persistent nonunion, but is healing very slowlyit seems with bone stimulator. For f/u plate removal hopefully next April. Still getting prolia for the osteoporosis.  Also seen per Dr Tamala Julian for hip right bursitis. Now living in [place] after a while in [place].  Also getting cortisone for left shoulder bursitis. Has some ringing to right ear with hearing loss, occas dizzy, asks to see Dr Elwyn Reach Past Medical History  Diagnosis Date  . Hypothyroidism   . Anxiety and depression   . Allergic rhinitis   . IBS (irritable bowel syndrome)   . Hyperlipidemia   . Foot fracture, left     hx of  . Cyst of ovary     bilateral  . Anemia     history of anemia secondary to fibroid sx  . Low back pain   . Lumbar disc disease 07/20/2010  . Cervical disc disease 07/20/2010  . Vitamin D deficiency 07/20/2010  . Iron deficiency 07/20/2010  . Osteoporosis 07/20/2010  . 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 Vaginal  hysterectomy    . Right lateral epicondylar    . Fibroid sx    . Colonoscopy      reports that she has quit smoking. She does not have any smokeless tobacco history on file. She reports that she does not drink alcohol or use illicit drugs. family history includes Alcohol abuse in her father and mother; Heart attack in her father. Allergies  Allergen Reactions  . Sulfonamide Derivatives    Current Outpatient Prescriptions on File Prior to Visit  Medication Sig Dispense Refill  . Diclofenac Sodium 2 % SOLN Apply twice daily. 112 g 1  . levothyroxine (SYNTHROID, LEVOTHROID) 50 MCG tablet Take 1 tablet (50 mcg total) by mouth every other day. 45 tablet 3  . levothyroxine (SYNTHROID, LEVOTHROID) 75 MCG tablet Take 1 tablet (75 mcg total) by mouth every other day. 45 tablet 3  . ondansetron (ZOFRAN ODT) 4 MG disintegrating tablet Take 1 tablet (4 mg total) by mouth every 8 (eight) hours as needed. 30 tablet 2  . valACYclovir (VALTREX) 1000 MG tablet Take 1 tablet (1,000 mg total) by mouth 2 (two) times daily. 10 tablet 0  . zolpidem (AMBIEN CR) 6.25 MG CR tablet TAKE 1 TABLET BY MOUTH AT BEDTIME AS NEEDED 30 tablet 5  . ALPRAZolam (XANAX) 0.5 MG tablet TAKE 1 TABLET BY MOUTH EVERY DAY (Patient not taking: Reported on 10/07/2014) 30 tablet 5  . nadolol (CORGARD) 20 MG tablet But can take additional 1/2 tablet (10  mg) if needed for PVC's  (up to a total of 30 mg daily) (Patient not taking: Reported on 10/07/2014) 90 tablet 3  . propranolol (INDERAL) 10 MG tablet 1 DAILY AS NEEDED FOR PALPITATIONS. (Patient not taking: Reported on 10/07/2014) 30 tablet 2   No current facility-administered medications on file prior to visit.     Review of Systems Constitutional: Negative for increased diaphoresis, other activity, appetite or siginficant weight change other than noted HENT: Negative for worsening hearing loss, ear pain, facial swelling, mouth sores and neck stiffness.   Eyes: Negative for other  worsening pain, redness or visual disturbance.  Respiratory: Negative for shortness of breath and wheezing  Cardiovascular: Negative for chest pain and palpitations.  Gastrointestinal: Negative for diarrhea, blood in stool, abdominal distention or other pain Genitourinary: Negative for hematuria, flank pain or change in urine volume.  Musculoskeletal: Negative for myalgias or other joint complaints.  Skin: Negative for color change and wound or drainage.  Neurological: Negative for syncope and numbness. other than noted Hematological: Negative for adenopathy. or other swelling Psychiatric/Behavioral: Negative for hallucinations, SI, self-injury, decreased concentration or other worsening agitation.      Objective:   Physical Exam BP 116/64 mmHg  Pulse 53  Temp(Src) 98.2 F (36.8 C) (Oral)  Ht 5\' 7"  (1.702 m)  Wt 133 lb (60.328 kg)  BMI 20.83 kg/m2  SpO2 99% VS noted,  Constitutional: Pt is oriented to person, place, and time. Appears well-developed and well-nourished, in no significant distress Head: Normocephalic and atraumatic.  Right Ear: External ear normal.  Left Ear: External ear normal.  Nose: Nose normal.  Mouth/Throat: Oropharynx is clear and moist.  Eyes: Conjunctivae and EOM are normal. Pupils are equal, round, and reactive to light.  Neck: Normal range of motion. Neck supple. No JVD present. No tracheal deviation present or significant neck LA or mass Cardiovascular: Normal rate, regular rhythm, normal heart sounds and intact distal pulses.   Pulmonary/Chest: Effort normal and breath sounds without rales or wheezing  Abdominal: Soft. Bowel sounds are normal. NT. No HSM  Musculoskeletal: Normal range of motion. Exhibits no edema.  Lymphadenopathy:  Has no cervical adenopathy.  Neurological: Pt is alert and oriented to person, place, and time. Pt has normal reflexes. No cranial nerve deficit. Motor grossly intact Skin: Skin is warm and dry. No rash noted.  Psychiatric:   Has 1+ nervous mood and affect. Behavior is normal.     Assessment & Plan:

## 2014-10-07 NOTE — Addendum Note (Signed)
Addended by: Biagio Borg on: 10/07/2014 12:25 PM   Modules accepted: Miquel Dunn

## 2014-10-08 LAB — HEPATITIS C ANTIBODY: HCV Ab: NEGATIVE

## 2014-11-23 ENCOUNTER — Other Ambulatory Visit: Payer: Self-pay | Admitting: Internal Medicine

## 2014-11-24 NOTE — Telephone Encounter (Signed)
Rx faxed to pharmacy  

## 2014-11-24 NOTE — Telephone Encounter (Signed)
Done hardcopy to stacy 

## 2014-12-01 ENCOUNTER — Other Ambulatory Visit: Payer: Self-pay | Admitting: Internal Medicine

## 2015-01-27 ENCOUNTER — Other Ambulatory Visit: Payer: Self-pay | Admitting: Internal Medicine

## 2015-01-31 ENCOUNTER — Other Ambulatory Visit: Payer: Self-pay | Admitting: Internal Medicine

## 2015-02-08 NOTE — Telephone Encounter (Signed)
I have electronically submitted pt's info for Prolia insurance verification and will notify you once I have a response. Thank you. °

## 2015-02-15 NOTE — Telephone Encounter (Signed)
I have rec'd Ms. [name]'s Pharmacist, community for Prolia and w/out an OV it will be covered at 100% of the contracted rate for an estimated responsibility of $0; w/an OV she will have an estimated responsibiliy of $25 co-pay.  Please make pt aware this is an estimate and we will not know and exact amt until insurance(s) has/have paid.  I have sent a copy of the summary of benefits to be scanned into pt's chart.  Please remind her she cannot rec her injection prior to 03/18/2015 or ins may deny.  Once pt recs injection, please let me know actual injection date so I can update the Prolia portal.  If you have any questions, please let me know.  Thank you.

## 2015-02-17 NOTE — Telephone Encounter (Signed)
Called and r/s to 03/22/2015

## 2015-03-17 ENCOUNTER — Ambulatory Visit: Payer: 59

## 2015-03-22 ENCOUNTER — Ambulatory Visit (INDEPENDENT_AMBULATORY_CARE_PROVIDER_SITE_OTHER): Payer: 59 | Admitting: General Practice

## 2015-03-22 DIAGNOSIS — M810 Age-related osteoporosis without current pathological fracture: Secondary | ICD-10-CM | POA: Diagnosis not present

## 2015-03-22 MED ORDER — DENOSUMAB 60 MG/ML ~~LOC~~ SOLN
60.0000 mg | Freq: Once | SUBCUTANEOUS | Status: AC
  Administered 2015-03-22: 60 mg via SUBCUTANEOUS

## 2015-06-07 ENCOUNTER — Other Ambulatory Visit: Payer: Self-pay

## 2015-06-07 ENCOUNTER — Ambulatory Visit (INDEPENDENT_AMBULATORY_CARE_PROVIDER_SITE_OTHER): Payer: 59 | Admitting: Family Medicine

## 2015-06-07 ENCOUNTER — Other Ambulatory Visit (INDEPENDENT_AMBULATORY_CARE_PROVIDER_SITE_OTHER): Payer: 59

## 2015-06-07 ENCOUNTER — Encounter: Payer: Self-pay | Admitting: Family Medicine

## 2015-06-07 VITALS — BP 102/80 | HR 59 | Ht 67.0 in | Wt 135.0 lb

## 2015-06-07 DIAGNOSIS — M2550 Pain in unspecified joint: Secondary | ICD-10-CM | POA: Diagnosis not present

## 2015-06-07 DIAGNOSIS — M75102 Unspecified rotator cuff tear or rupture of left shoulder, not specified as traumatic: Secondary | ICD-10-CM | POA: Diagnosis not present

## 2015-06-07 DIAGNOSIS — E559 Vitamin D deficiency, unspecified: Secondary | ICD-10-CM

## 2015-06-07 DIAGNOSIS — M25511 Pain in right shoulder: Secondary | ICD-10-CM | POA: Diagnosis not present

## 2015-06-07 DIAGNOSIS — M25512 Pain in left shoulder: Secondary | ICD-10-CM

## 2015-06-07 DIAGNOSIS — M65331 Trigger finger, right middle finger: Secondary | ICD-10-CM | POA: Insufficient documentation

## 2015-06-07 DIAGNOSIS — M7551 Bursitis of right shoulder: Secondary | ICD-10-CM | POA: Insufficient documentation

## 2015-06-07 LAB — CBC WITH DIFFERENTIAL/PLATELET
BASOS PCT: 0.7 % (ref 0.0–3.0)
Basophils Absolute: 0 10*3/uL (ref 0.0–0.1)
EOS ABS: 0.2 10*3/uL (ref 0.0–0.7)
Eosinophils Relative: 4.2 % (ref 0.0–5.0)
HCT: 36.7 % (ref 36.0–46.0)
Hemoglobin: 12.4 g/dL (ref 12.0–15.0)
LYMPHS ABS: 1.9 10*3/uL (ref 0.7–4.0)
Lymphocytes Relative: 41.9 % (ref 12.0–46.0)
MCHC: 33.9 g/dL (ref 30.0–36.0)
MCV: 91.9 fl (ref 78.0–100.0)
MONO ABS: 0.5 10*3/uL (ref 0.1–1.0)
Monocytes Relative: 9.9 % (ref 3.0–12.0)
NEUTROS ABS: 2 10*3/uL (ref 1.4–7.7)
NEUTROS PCT: 43.3 % (ref 43.0–77.0)
PLATELETS: 184 10*3/uL (ref 150.0–400.0)
RBC: 3.99 Mil/uL (ref 3.87–5.11)
RDW: 13 % (ref 11.5–15.5)
WBC: 4.6 10*3/uL (ref 4.0–10.5)

## 2015-06-07 LAB — IRON: IRON: 95 ug/dL (ref 42–145)

## 2015-06-07 LAB — C-REACTIVE PROTEIN: CRP: 0.1 mg/dL — ABNORMAL LOW (ref 0.5–20.0)

## 2015-06-07 LAB — RHEUMATOID FACTOR: Rhuematoid fact SerPl-aCnc: <10 IU/mL (ref <=14)

## 2015-06-07 LAB — VITAMIN D 25 HYDROXY (VIT D DEFICIENCY, FRACTURES): VITD: 50.84 ng/mL (ref 30.00–100.00)

## 2015-06-07 LAB — SEDIMENTATION RATE: SED RATE: 10 mm/h (ref 0–22)

## 2015-06-07 MED ORDER — VITAMIN D (ERGOCALCIFEROL) 1.25 MG (50000 UNIT) PO CAPS: 50000.0000 [IU] | ORAL_CAPSULE | ORAL | Status: DC

## 2015-06-07 MED ORDER — MELOXICAM 15 MG PO TABS: 15.0000 mg | ORAL_TABLET | Freq: Every day | ORAL | Status: DC

## 2015-06-07 NOTE — Assessment & Plan Note (Signed)
Once weekly vitamin D given. Checking labs today.

## 2015-06-07 NOTE — Patient Instructions (Addendum)
Good to see you  \partial rotator cuf tear on left and bursitis on the right Labs downstairs today as well.  Injected shoulder  Today and I hope it helps Injected finger as well.  Exercises 3 times a week.  Ice 20 minutes 2 times daily. Usually after activity and before bed. Meloxicam daily for 10 days then as needed.  VitaMIN d ONCE WEEKLY (PRESCRIPTION i SENT IN) Turmeric 500mg  twice daily See me again in 3-4 weeks

## 2015-06-07 NOTE — Assessment & Plan Note (Signed)
Given injection today. Patient tolerated procedure well. Hopefully this will be beneficial. Discuss can do 3 within a 12 month period. Patient will do some bracing at night. Follow-up in 3 weeks.

## 2015-06-07 NOTE — Progress Notes (Signed)
Pre visit review using our clinic review tool, if applicable. No additional management support is needed unless otherwise documented below in the visit note. 

## 2015-06-07 NOTE — Progress Notes (Signed)
[name] [place], [place] [address] Phone: [phone] Subjective:    I'm seeing this patient by the request  of:    CC: Shoulder pain   [id number] [name] is a 55 y.o. female coming in with complaint of shoulder pain.  Patient is a very pleasant individual. Coming in with shoulder pain. Was seen by another facility and was diagnosed with a bicep tendinitis. States has had 3 injections on the left side.  Patient states that she continues to have less on the right side seems to be hurting her more. States that it as a throbbing aching pain that radiates down her arms. Denies any neck pain. Feels that it is worsening over the course of time. Triggering of her daily activities such as dressing. Can wake her up at night. Did respond well to oral anti-inflammatories. Patient was told by the other provider on her left shoulder she would likely need an MRI continued have pain.  Patient is also coming in with a trigger finger on the right side. States that his middle finger. Gets stuck in a flexed position and is very painful to extend. Denies any swelling. Has had an injection in even surgery on this one time before.     Past Medical History  Diagnosis Date  . Hypothyroidism   . Anxiety and depression   . Allergic rhinitis   . IBS (irritable bowel syndrome)   . Hyperlipidemia   . Foot fracture, left     hx of  . Cyst of ovary     bilateral  . Anemia     history of anemia secondary to fibroid sx  . Low back pain   . Lumbar disc disease 07/20/2010  . Cervical disc disease 07/20/2010  . Vitamin D deficiency 07/20/2010  . Iron deficiency 07/20/2010  . Osteoporosis 07/20/2010  . 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 Vaginal hysterectomy    . Right lateral epicondylar    . Fibroid sx    . Colonoscopy     Social History   Social History  . Marital Status: Married    Spouse Name: N/A   . Number of Children: N/A  . Years of Education: N/A   Occupational History  . cna    Social History Main Topics  . Smoking status: Former Research scientist (life sciences)  . Smokeless tobacco: Not on file     Comment: quit 30 years ago  . Alcohol Use: No  . Drug Use: No  . Sexual Activity: Not on file   Other Topics Concern  . 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         Allergies  Allergen Reactions  . Sulfonamide Derivatives    Family History  Problem Relation Age of Onset  . Alcohol abuse Mother   . Alcohol abuse Father   . Heart attack Father     died suddenly at home with presumed MI    Past medical history, social, surgical and family history all reviewed in electronic medical record.  No pertanent information unless stated regarding to the chief complaint.   Review of Systems: No headache, visual changes, nausea, vomiting, diarrhea, constipation, dizziness, abdominal pain, skin rash, fevers, chills, night sweats, weight loss, swollen lymph nodes, body aches, joint swelling, muscle  aches, chest pain, shortness of breath, mood changes.   Objective There were no vitals taken for this visit.  General: No apparent distress alert and oriented x3 mood and affect normal, dressed appropriately.  HEENT: Pupils equal, extraocular movements intact  Respiratory: Patient's speak in full sentences and does not appear short of breath  Cardiovascular: No lower extremity edema, non tender, no erythema  Skin: Warm dry intact with no signs of infection or rash on extremities or on axial skeleton.  Abdomen: Soft nontender  Neuro: Cranial nerves II through XII are intact, neurovascularly intact in all extremities with 2+ DTRs and 2+ pulses.  Lymph: No lymphadenopathy of posterior or anterior cervical chain or axillae bilaterally.  Gait normal with good balance and coordination.    MSK:  Non tender with full range of motion and good stability and symmetric strength and tone of  elbows, wrist, hip, knee and ankles bilaterally.  Shoulder: Right Inspection reveals no abnormalities, atrophy or asymmetry. Palpation is normal with no tenderness over AC joint or bicipital groove. ROM is full in all planes passively. Rotator cuff strength normal throughout. signs of impingement with positive Neer and Hawkin's tests, but negative empty can sign. Speeds and Yergason's tests normal. No labral pathology noted with negative Obrien's, negative clunk and good stability. Normal scapular function observed. No painful arc and no drop arm sign. No apprehension sign Contralateral shoulder does have positive impingement as well. Mild weakness of 4 out of 5 strength compared to the contralateral side.  Hand exam shows the patient does have a trigger nodule on the third finger on the right side. Triggering noted. Tender to palpation. Neurovascularly intact distally. Good capillary refill.  MSK US performed of: Right and left shoulder This study was ordered, performed, and interpreted by Charlann Boxer D.O.  Shoulder:   Supraspinatus:  Appears normal on long and transverse views, Bursal bulge seen with shoulder abduction on impingement view. Infraspinatus:  Appears normal on long and transverse views. Significant increase in Doppler flow Subscapularis:  Appears normal on long and transverse views. Positive bursa Teres Minor:  Appears normal on long and transverse views. AC joint:  Capsule undistended, no geyser sign. Glenohumeral Joint:  Appears normal without effusion. Glenoid Labrum:  Intact without visualized tears. Biceps Tendon:  Appears normal on long and transverse views, no fraying of tendon, tendon located in intertubercular groove, no subluxation with shoulder internal or external rotation. 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 Chronic in nature.  Impression: Subacromial bursitis of the right shoulder and the home partial thickness tear of the rotator cuff on left    Procedure: Real-time Ultrasound Guided Injection of right glenohumeral joint Device: GE Logiq E  Ultrasound guided injection is preferred based studies that show increased duration, increased effect, greater accuracy, decreased procedural pain, increased response rate with ultrasound guided versus blind injection.  Verbal informed consent obtained.  Time-out conducted.  Noted no overlying erythema, induration, or other signs of local infection.  Skin prepped in a sterile fashion.  Local anesthesia: Topical Ethyl chloride.  With sterile technique and under real time ultrasound guidance:  Joint visualized.  23g 1  inch needle inserted posterior approach. Pictures taken for needle placement. Patient did have injection of 2 cc of 1% lidocaine, 2 cc of 0.5% Marcaine, and 1.0 cc of Kenalog 40 mg/dL. Completed without difficulty  Pain immediately resolved suggesting accurate placement of  the medication.  Advised to call if fevers/chills, erythema, induration, drainage, or persistent bleeding.  Images permanently stored and available for review in the ultrasound unit.  Impression: Technically successful ultrasound guided injection.  Procedure: Real-time Ultrasound Guided Injection of right third flexor tendon sheath Device: GE Logiq E  Ultrasound guided injection is preferred based studies that show increased duration, increased effect, greater accuracy, decreased procedural pain, increased response rate, and decreased cost with ultrasound guided versus blind injection.  Verbal informed consent obtained.  Time-out conducted.  Noted no overlying erythema, induration, or other signs of local infection.  Skin prepped in a sterile fashion.  Local anesthesia: Topical Ethyl chloride.  With sterile  technique and under real time ultrasound guidance:  With a 25-gauge 1 inch needle patient was injected with a total of 0.5 mL of 0.5% Marcaine and 0.5 mL of Kenalog 40 mg/dL. Completed without difficulty  Pain immediately resolved suggesting accurate placement of the medication.  Advised to call if fevers/chills, erythema, induration, drainage, or persistent bleeding.  Images permanently stored and available for review in the ultrasound unit.  Impression: Technically successful ultrasound guided injection.  Procedure note [id number]; 15 minutes spent for Therapeutic exercises as stated in above notes.  This included exercises focusing on stretching, strengthening, with significant focus on eccentric aspects.  Shoulder Exercises that included:  Basic scapular stabilization to include adduction and depression of scapula Scaption, focusing on proper movement and good control Internal and External rotation utilizing a theraband, with elbow tucked at side entire time Rows with theraband  Proper technique shown and discussed handout in great detail with ATC.  All questions were discussed and answered.     Impression and Recommendations:     This case required medical decision making of moderate complexity.      Note: This dictation was prepared with Dragon dictation along with smaller phrase technology. Any transcriptional errors that result from this process are unintentional.

## 2015-06-07 NOTE — Assessment & Plan Note (Signed)
Does have what appears to be a partial tear of the rotator cuff. Patient has not done formal physical therapy and decline. Work with Product/process development scientist to learn home exercises. I do believe the patient's increase in injuries as well as early onset of osteoporosis do feel that further workup including laboratory is necessary. Patient will have these ordered. We discussed icing regimen. Patient and will come back and see me again in 3-4 weeks. Meloxicam given for pain relief.

## 2015-06-07 NOTE — Assessment & Plan Note (Signed)
Patient given injection today and tolerated the procedure well. We discussed icing regimen and home exercises. Discussed which activities to do in which ones to avoid. Patient will follow-up and see me again in 3-4 weeks. Work with Product/process development scientist.

## 2015-06-08 ENCOUNTER — Other Ambulatory Visit: Payer: Self-pay | Admitting: Internal Medicine

## 2015-06-08 LAB — ANA: Anti Nuclear Antibody(ANA): NEGATIVE

## 2015-06-08 LAB — ANTI-DNA ANTIBODY, DOUBLE-STRANDED: DS DNA AB: 4 [IU]/mL

## 2015-06-08 LAB — CYCLIC CITRUL PEPTIDE ANTIBODY, IGG

## 2015-06-09 NOTE — Telephone Encounter (Signed)
Please advise 

## 2015-06-09 NOTE — Telephone Encounter (Signed)
Medication faxed to pharmacy 

## 2015-06-09 NOTE — Telephone Encounter (Signed)
Done hardcopy to Corinne  

## 2015-07-05 ENCOUNTER — Ambulatory Visit (INDEPENDENT_AMBULATORY_CARE_PROVIDER_SITE_OTHER): Payer: 59 | Admitting: Family Medicine

## 2015-07-05 ENCOUNTER — Encounter: Payer: Self-pay | Admitting: Family Medicine

## 2015-07-05 ENCOUNTER — Other Ambulatory Visit: Payer: Self-pay

## 2015-07-05 VITALS — BP 120/70 | HR 63 | Ht 66.0 in | Wt 133.0 lb

## 2015-07-05 DIAGNOSIS — M23207 Derangement of unspecified meniscus due to old tear or injury, left knee: Secondary | ICD-10-CM

## 2015-07-05 DIAGNOSIS — M23204 Derangement of unspecified medial meniscus due to old tear or injury, left knee: Secondary | ICD-10-CM | POA: Diagnosis not present

## 2015-07-05 DIAGNOSIS — M23209 Derangement of unspecified meniscus due to old tear or injury, unspecified knee: Secondary | ICD-10-CM | POA: Insufficient documentation

## 2015-07-05 DIAGNOSIS — E559 Vitamin D deficiency, unspecified: Secondary | ICD-10-CM

## 2015-07-05 DIAGNOSIS — M25561 Pain in right knee: Secondary | ICD-10-CM | POA: Diagnosis not present

## 2015-07-05 DIAGNOSIS — M23201 Derangement of unspecified lateral meniscus due to old tear or injury, left knee: Secondary | ICD-10-CM

## 2015-07-05 DIAGNOSIS — M75102 Unspecified rotator cuff tear or rupture of left shoulder, not specified as traumatic: Secondary | ICD-10-CM

## 2015-07-05 DIAGNOSIS — M65331 Trigger finger, right middle finger: Secondary | ICD-10-CM | POA: Diagnosis not present

## 2015-07-05 NOTE — Assessment & Plan Note (Signed)
Improved after injection. We'll continue to monitor. We'll have patient do bracing at night.

## 2015-07-05 NOTE — Assessment & Plan Note (Signed)
Making some progress. Has some increase in strength. No significant worsening of symptoms and if anything better overall. Patient will avoid any overhead activity or heavy lifting the patient is moving. Discussed with patient to avoid heavy lifting. Patient will continue with the topical antibiotic laboratories. Continue with the vitamin D supplementation. Follow-up with me again in 4-6 weeks.

## 2015-07-05 NOTE — Patient Instructions (Signed)
Good to see yo u [name] is your friend For the finger try the popsicle stick taped on palm of hand to keep finger straight at night.  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  Physical therapy will call you and set up when you have time after the move Continue to watch the shoulder.   See me again in 4-6 weeks!

## 2015-07-05 NOTE — Progress Notes (Signed)
Pre visit review using our clinic review tool, if applicable. No additional management support is needed unless otherwise documented below in the visit note. 

## 2015-07-05 NOTE — Progress Notes (Signed)
[name] [address] [place], [place] [address] Phone: [phone] Subjective:      CC: Shoulder pain f/u and trigger finger follow-up  [id number] [name] is a 55 y.o. female coming in with complaint of shoulder pain.  Patient Was found to have a left-sided rotator cuff tear that was partial as well as the right side and bursitis. Patient was given an injection. Has been doing significantly better. States that she has not been doing the lifting on a regular basis. Thinks that this is been helpful as well. Denies any radiation down the arm. Having some difficulty with daily exercises. Wondering to formal physical therapy would be better.  Patient is also coming in with a trigger finger on the right side. Was given injections previously. No locking at the moment. Has full range of motion. No numbness. States still getting, may be in the mornings.  Patient is also complaining of a new problem. Left knee pain. Was told by another provider that she needed to have surgery for her patella. States from time to time it seems to get stuck. Patient states that this is very intermittent. No swelling, no radiation. Rates the severity pain is 4 out of 10. Not affecting daily activities at the moment. Just like it to be evaluated.     Past Medical History  Diagnosis Date  . Hypothyroidism   . Anxiety and depression   . Allergic rhinitis   . IBS (irritable bowel syndrome)   . Hyperlipidemia   . Foot fracture, left     hx of  . Cyst of ovary     bilateral  . Anemia     history of anemia secondary to fibroid sx  . Low back pain   . Lumbar disc disease 07/20/2010  . Cervical disc disease 07/20/2010  . Vitamin D deficiency 07/20/2010  . Iron deficiency 07/20/2010  . Osteoporosis 07/20/2010  . 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 Vaginal hysterectomy    . Right lateral epicondylar    . Fibroid sx    .  Colonoscopy     Social History   Social History  . Marital Status: Married    Spouse Name: N/A  . Number of Children: N/A  . Years of Education: N/A   Occupational History  . cna    Social History Main Topics  . Smoking status: Former Research scientist (life sciences)  . Smokeless tobacco: None     Comment: quit 30 years ago  . Alcohol Use: No  . Drug Use: No  . Sexual Activity: Not Asked   Other Topics Concern  . 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         Allergies  Allergen Reactions  . Sulfonamide Derivatives    Family History  Problem Relation Age of Onset  . Alcohol abuse Mother   . Alcohol abuse Father   . Heart attack Father     died suddenly at home with presumed MI    Past medical history, social, surgical and family history all reviewed in electronic medical record.  No pertanent information unless stated regarding to the chief complaint.   Review of Systems: No headache, visual changes, nausea, vomiting, diarrhea, constipation, dizziness, abdominal pain, skin rash, fevers, chills, night sweats, weight loss, swollen lymph nodes, body  aches, joint swelling, muscle aches, chest pain, shortness of breath, mood changes.   Objective Blood pressure 120/70, pulse 63, height 5\' 6"  (1.676 m), weight 133 lb (60.328 kg), SpO2 97 %.  General: No apparent distress alert and oriented x3 mood and affect normal, dressed appropriately.  HEENT: Pupils equal, extraocular movements intact  Respiratory: Patient's speak in full sentences and does not appear short of breath  Cardiovascular: No lower extremity edema, non tender, no erythema  Skin: Warm dry intact with no signs of infection or rash on extremities or on axial skeleton.  Abdomen: Soft nontender  Neuro: Cranial nerves II through XII are intact, neurovascularly intact in all extremities with 2+ DTRs and 2+ pulses.    Lymph: No lymphadenopathy of posterior or anterior cervical chain or axillae bilaterally.  Gait normal with good balance and coordination.  MSK:  Non tender with full range of motion and good stability and symmetric strength and tone of  elbows, wrist, hip, and ankles bilaterally.  Shoulder: Right Inspection reveals no abnormalities, atrophy or asymmetry. Palpation is normal with no tenderness over AC joint or bicipital groove. ROM is full in all planes passively. Rotator cuff strength normal throughout. Mild signs of impingement still remaining but improved Speeds and Yergason's tests normal. No labral pathology noted with negative Obrien's, negative clunk and good stability. Normal scapular function observed. No painful arc and no drop arm sign. No apprehension sign Contralateral shoulder has full strength which is an improvement but still mild impingement signs  Hand exam shows no triggering today.  Knee: Left Normal to inspection with no erythema or effusion or obvious bony abnormalities. Very mild lateral tilt of the knee Noted Palpation normal with no warmth, joint line tenderness, patellar tenderness, or condyle tenderness. ROM full in flexion and extension and lower leg rotation. Ligaments with solid consistent endpoints including ACL, PCL, LCL, MCL. Positive Mcmurray's, Apley's, and Thessalonian tests. Mild painful patellar compression. Patellar glide without crepitus. Patellar and quadriceps tendons unremarkable. Hamstring and quadriceps strength is normal.  Contralateral knee unremarkable     MSK US performed of: Left knee This study was ordered, performed, and interpreted by Charlann Boxer D.O.  Knee: All structures visualized. Mild narrowing of the patellofemoral. Patient does have chronic degenerative meniscal tears noted midbody of the lateral and medial meniscus. No significant displacement or hypoechoic changes and increasing Doppler flow. Patellar Tendon  unremarkable on long and transverse views without effusion. No abnormality of prepatellar bursa. LCL and MCL unremarkable on long and transverse views. No abnormality of origin of medial or lateral head of the gastrocnemius.  IMPRESSION:  Mild patellofemoral and chronic meniscal tears  Impression and Recommendations:     This case required medical decision making of moderate complexity.      Note: This dictation was prepared with Dragon dictation along with smaller phrase technology. Any transcriptional errors that result from this process are unintentional.

## 2015-07-05 NOTE — Assessment & Plan Note (Signed)
We'll continue once weekly supplementation regularly.

## 2015-07-05 NOTE — Assessment & Plan Note (Signed)
Patient does have a medial and lateral chronic meniscal tears with no significant displacement. Seems to be mid body. Patient states that sometimes there is a locking sensation. We will continue to monitor. No inflammation or erythema noted. Patient will start with formal physical therapy. We discussed icing regimen and patient will do topical anti-inflammatories. Forcing symptoms we'll consider injection.

## 2015-08-02 ENCOUNTER — Other Ambulatory Visit: Payer: Self-pay | Admitting: Internal Medicine

## 2015-08-03 ENCOUNTER — Other Ambulatory Visit: Payer: Self-pay | Admitting: Family Medicine

## 2015-08-10 ENCOUNTER — Ambulatory Visit: Payer: 59 | Admitting: Family Medicine

## 2015-09-07 ENCOUNTER — Other Ambulatory Visit: Payer: Self-pay | Admitting: Family Medicine

## 2015-09-07 NOTE — Telephone Encounter (Signed)
Refill done.  

## 2015-09-13 ENCOUNTER — Other Ambulatory Visit: Payer: Self-pay | Admitting: Internal Medicine

## 2015-09-13 ENCOUNTER — Telehealth: Payer: Self-pay

## 2015-09-13 MED ORDER — VALACYCLOVIR HCL 1 G PO TABS: 1000.0000 mg | ORAL_TABLET | Freq: Two times a day (BID) | ORAL | 2 refills | Status: DC

## 2015-09-13 NOTE — Telephone Encounter (Signed)
I called and left message advising patient that her prolia injection is due either on or after 09/20/15---insurance has been verified with estimated $0 co-pay---patient is due to come in for office visit with dr Jenny Reichmann on 10/18/15---she can either get injection at that office visit or schedule nurse visit before then to get prolia injection---let tamara know what patient decides if she calls back in, can talk with tamara if any questions

## 2015-09-21 NOTE — Telephone Encounter (Signed)
I have scheduled patient for 8/21.

## 2015-09-26 ENCOUNTER — Ambulatory Visit (INDEPENDENT_AMBULATORY_CARE_PROVIDER_SITE_OTHER): Payer: 59

## 2015-09-26 DIAGNOSIS — M810 Age-related osteoporosis without current pathological fracture: Secondary | ICD-10-CM

## 2015-09-26 MED ORDER — DENOSUMAB 60 MG/ML ~~LOC~~ SOLN
60.0000 mg | Freq: Once | SUBCUTANEOUS | Status: AC
  Administered 2015-09-26: 60 mg via SUBCUTANEOUS

## 2015-09-26 NOTE — Progress Notes (Signed)
Medical screening examination/treatment/procedure(s) were performed by non-physician practitioner and as supervising provider I was immediately available for consultation/collaboration.I have reviewed and agree with the treatments. Mauricio Po, FNP

## 2015-10-18 ENCOUNTER — Other Ambulatory Visit (INDEPENDENT_AMBULATORY_CARE_PROVIDER_SITE_OTHER): Payer: 59

## 2015-10-18 ENCOUNTER — Ambulatory Visit (INDEPENDENT_AMBULATORY_CARE_PROVIDER_SITE_OTHER): Payer: 59 | Admitting: Internal Medicine

## 2015-10-18 ENCOUNTER — Encounter: Payer: Self-pay | Admitting: Internal Medicine

## 2015-10-18 ENCOUNTER — Telehealth: Payer: Self-pay | Admitting: Emergency Medicine

## 2015-10-18 VITALS — BP 132/76 | HR 65 | Temp 98.0°F | Resp 20 | Wt 135.0 lb

## 2015-10-18 DIAGNOSIS — E039 Hypothyroidism, unspecified: Secondary | ICD-10-CM

## 2015-10-18 DIAGNOSIS — I872 Venous insufficiency (chronic) (peripheral): Secondary | ICD-10-CM

## 2015-10-18 DIAGNOSIS — Z0001 Encounter for general adult medical examination with abnormal findings: Secondary | ICD-10-CM

## 2015-10-18 DIAGNOSIS — Z1211 Encounter for screening for malignant neoplasm of colon: Secondary | ICD-10-CM | POA: Diagnosis not present

## 2015-10-18 DIAGNOSIS — J069 Acute upper respiratory infection, unspecified: Secondary | ICD-10-CM

## 2015-10-18 DIAGNOSIS — F411 Generalized anxiety disorder: Secondary | ICD-10-CM

## 2015-10-18 DIAGNOSIS — Z0000 Encounter for general adult medical examination without abnormal findings: Secondary | ICD-10-CM | POA: Diagnosis not present

## 2015-10-18 DIAGNOSIS — R6889 Other general symptoms and signs: Secondary | ICD-10-CM | POA: Diagnosis not present

## 2015-10-18 LAB — LIPID PANEL
CHOL/HDL RATIO: 2
Cholesterol: 220 mg/dL — ABNORMAL HIGH (ref 0–200)
HDL: 95.5 mg/dL (ref >39.00)
LDL CALC: 113 mg/dL — AB (ref 0–99)
NonHDL: 124.14
TRIGLYCERIDES: 56 mg/dL (ref 0.0–149.0)
VLDL: 11.2 mg/dL (ref 0.0–40.0)

## 2015-10-18 LAB — CBC WITH DIFFERENTIAL/PLATELET
BASOS PCT: 0.5 % (ref 0.0–3.0)
Basophils Absolute: 0 10*3/uL (ref 0.0–0.1)
EOS ABS: 0.2 10*3/uL (ref 0.0–0.7)
EOS PCT: 3.5 % (ref 0.0–5.0)
HEMATOCRIT: 36.2 % (ref 36.0–46.0)
HEMOGLOBIN: 12.5 g/dL (ref 12.0–15.0)
LYMPHS PCT: 43.4 % (ref 12.0–46.0)
Lymphs Abs: 2.1 10*3/uL (ref 0.7–4.0)
MCHC: 34.6 g/dL (ref 30.0–36.0)
MCV: 91.4 fl (ref 78.0–100.0)
Monocytes Absolute: 0.6 10*3/uL (ref 0.1–1.0)
Monocytes Relative: 12.1 % — ABNORMAL HIGH (ref 3.0–12.0)
Neutro Abs: 1.9 10*3/uL (ref 1.4–7.7)
Neutrophils Relative %: 40.5 % — ABNORMAL LOW (ref 43.0–77.0)
Platelets: 153 10*3/uL (ref 150.0–400.0)
RBC: 3.96 Mil/uL (ref 3.87–5.11)
RDW: 13.2 % (ref 11.5–15.5)
WBC: 4.8 10*3/uL (ref 4.0–10.5)

## 2015-10-18 LAB — HEPATIC FUNCTION PANEL
ALBUMIN: 4.7 g/dL (ref 3.5–5.2)
ALT: 15 U/L (ref 0–35)
AST: 20 U/L (ref 0–37)
Alkaline Phosphatase: 32 U/L — ABNORMAL LOW (ref 39–117)
BILIRUBIN TOTAL: 0.4 mg/dL (ref 0.2–1.2)
Bilirubin, Direct: 0.1 mg/dL (ref 0.0–0.3)
Total Protein: 7.2 g/dL (ref 6.0–8.3)

## 2015-10-18 LAB — TSH: TSH: 5.41 u[IU]/mL — AB (ref 0.35–4.50)

## 2015-10-18 LAB — URINALYSIS, ROUTINE W REFLEX MICROSCOPIC
BILIRUBIN URINE: NEGATIVE
HGB URINE DIPSTICK: NEGATIVE
KETONES UR: NEGATIVE
LEUKOCYTES UA: NEGATIVE
NITRITE: NEGATIVE
RBC / HPF: NONE SEEN (ref 0–?)
Specific Gravity, Urine: 1.01 (ref 1.000–1.030)
TOTAL PROTEIN, URINE-UPE24: NEGATIVE
URINE GLUCOSE: NEGATIVE
Urobilinogen, UA: 0.2 (ref 0.0–1.0)
pH: 7.5 (ref 5.0–8.0)

## 2015-10-18 LAB — BASIC METABOLIC PANEL
BUN: 16 mg/dL (ref 6–23)
CO2: 31 mEq/L (ref 19–32)
CREATININE: 0.64 mg/dL (ref 0.40–1.20)
Calcium: 9.4 mg/dL (ref 8.4–10.5)
Chloride: 106 mEq/L (ref 96–112)
GFR: 102.03 mL/min (ref >60.00)
GLUCOSE: 84 mg/dL (ref 70–99)
Potassium: 4.1 mEq/L (ref 3.5–5.1)
Sodium: 142 mEq/L (ref 135–145)

## 2015-10-18 LAB — T4, FREE: FREE T4: 0.94 ng/dL (ref 0.60–1.60)

## 2015-10-18 MED ORDER — AZITHROMYCIN 250 MG PO TABS: ORAL_TABLET | ORAL | 1 refills | Status: DC

## 2015-10-18 NOTE — Progress Notes (Signed)
Pre visit review using our clinic review tool, if applicable. No additional management support is needed unless otherwise documented below in the visit note. 

## 2015-10-18 NOTE — Progress Notes (Signed)
Subjective:    Patient ID: [name], female    DOB: 11/06/1960, 55 y.o.   MRN: [id number]  HPI  Here for wellness and f/u;  Overall doing ok;  Pt denies Chest pain, worsening SOB, DOE, wheezing, orthopnea, PND, worsening LE edema, palpitations, dizziness or syncope.  Pt denies neurological change such as new headache, facial or extremity weakness.  Pt denies polydipsia, polyuria, or low sugar symptoms. Pt states overall good compliance with treatment and medications, good tolerability, and has been trying to follow appropriate diet.  Pt denies worsening depressive symptoms, suicidal ideation or panic. No fever, night sweats, wt loss, loss of appetite, or other constitutional symptoms.  Pt states good ability with ADL's, has low fall risk, home safety reviewed and adequate, no other significant changes in hearing or vision, and only occasionally active with exercise.  Has gained several lbs from usual 129.   Wt Readings from Last 3 Encounters:  10/18/15 135 lb (61.2 kg)  07/05/15 133 lb (60.3 kg)  06/07/15 135 lb (61.2 kg)  Eating out more lately due to multiple home moves in past 2 yrs  Admits to some med missed including thyroid meds due to lost meds for a few days with moves.  Still getting prolia twice yearly. Last dxa 2017.   Also here with 2-3 days acute onset fever, facial pain, pressure, headache, general weakness and malaise, and greenish d/c, with mild ST and cough non productive.Also c/o bilat distal LE pain only in the evening, burning, mild, sometimes makes hard to get to sleep.  Does also have left > right LE venous insufficiency type swelling it seems with trace edema returning in the evenings only, resolved by the AM>  Has numerous varicose veins but none painful Past Medical History:  Diagnosis Date  . Allergic rhinitis   . Anemia    history of anemia secondary to fibroid sx  . Anxiety and depression   . Cervical disc disease 07/20/2010  . Cyst of ovary    bilateral  .  Foot fracture, left    hx of  . Hyperlipidemia   . Hypothyroidism   . IBS (irritable bowel syndrome)   . Iron deficiency 07/20/2010  . Low back pain   . Lumbar disc disease 07/20/2010  . Osteoporosis 07/20/2010  . 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 Vitamin D deficiency 07/20/2010   Past Surgical History:  Procedure Laterality Date  . COLONOSCOPY    . fibroid sx    . right lateral epicondylar    . VAGINAL HYSTERECTOMY      reports that she has quit smoking. She does not have any smokeless tobacco history on file. She reports that she does not drink alcohol or use drugs. family history includes Alcohol abuse in her father and mother; Heart attack in her father. Allergies  Allergen Reactions  . Sulfonamide Derivatives    Current Outpatient Prescriptions on File Prior to Visit  Medication Sig Dispense Refill  . Diclofenac Sodium 2 % SOLN Apply twice daily. 112 g 1  . eletriptan (RELPAX) 40 MG tablet Take 1 tablet (40 mg total) by mouth daily as needed for migraine or headache. 10 tablet 1  . meloxicam (MOBIC) 15 MG tablet TAKE 1 TABLET EACH DAY. 30 tablet 2  . ondansetron (ZOFRAN ODT) 4 MG disintegrating tablet Take 1 tablet (4 mg total) by mouth every 8 (eight) hours as needed. 30 tablet 2  . SYNTHROID 50 MCG tablet TAKE 1 TABLET EVERY OTHER  DAY 45  tablet 1  . SYNTHROID 75 MCG tablet TAKE 1 TABLET EVERY OTHER  DAY 45 tablet 1  . Vitamin D, Ergocalciferol, (DRISDOL) 50000 units CAPS capsule TAKE ONE CAPSULE ONCE A WEEK. 4 capsule 0  . zolpidem (AMBIEN CR) 6.25 MG CR tablet TAKE 1 TABLET AT BEDTIME AS NEEDED. 90 tablet 1   No current facility-administered medications on file prior to visit.    Review of Systems Constitutional: Negative for increased diaphoresis, or other activity, appetite or siginficant weight change other than noted HENT: Negative for worsening hearing loss, ear pain, facial swelling, mouth sores and neck stiffness.   Eyes: Negative for other worsening pain, redness or  visual disturbance.  Respiratory: Negative for choking or stridor Cardiovascular: Negative for other chest pain and palpitations.  Gastrointestinal: Negative for worsening diarrhea, blood in stool, or abdominal distention Genitourinary: Negative for hematuria, flank pain or change in urine volume.  Musculoskeletal: Negative for myalgias or other joint complaints.  Skin: Negative for other color change and wound or drainage.  Neurological: Negative for syncope and numbness. other than noted Hematological: Negative for adenopathy. or other swelling Psychiatric/Behavioral: Negative for hallucinations, SI, self-injury, decreased concentration or other worsening agitation.      Objective:   Physical Exam BP 132/76   Pulse 65   Temp 98 F (36.7 C) (Oral)   Resp 20   Wt 135 lb (61.2 kg)   SpO2 97%   BMI 21.79 kg/m  VS noted, mild ill Constitutional: Pt is oriented to person, place, and time. Appears well-developed and well-nourished, in no significant distress Head: Normocephalic and atraumatic  Eyes: Conjunctivae and EOM are normal. Pupils are equal, round, and reactive to light Right Ear: External ear normal.  Left Ear: External ear normal Nose: Nose normal.  Bilat tm's with mild erythema.  Max sinus areas mild tender.  Pharynx with mild erythema, no exudate Mouth/Throat: Oropharynx is clear and moist  Neck: Normal range of motion. Neck supple. No JVD present. No tracheal deviation present or significant neck LA or mass Cardiovascular: Normal rate, regular rhythm, normal heart sounds and intact distal pulses.   Pulmonary/Chest: Effort normal and breath sounds without rales or wheezing  Abdominal: Soft. Bowel sounds are normal. NT. No HSM  Musculoskeletal: Normal range of motion. Lymphadenopathy: Has no cervical adenopathy.  Neurological: Pt is alert and oriented to person, place, and time. Pt has normal reflexes. No cranial nerve deficit. Motor grossly intact Skin: Skin is warm and  dry. No rash noted or new ulcers but many small varicosities to LE's, no active phlebitis, has trace edema LLE only Psychiatric:  Has 2+ nevous mood and affect. Behavior is normal.      Assessment & Plan:

## 2015-10-18 NOTE — Telephone Encounter (Signed)
This has been done, thanks 

## 2015-10-18 NOTE — Patient Instructions (Addendum)
You had the flu shot today  Please continue all other medications as before, and refills have been done if requested.  Please have the pharmacy call with any other refills you may need.  Please continue your efforts at being more active, low cholesterol diet, and weight control.  You are otherwise up to date with prevention measures today.  Please keep your appointments with your specialists as you may have planned  You will be contacted regarding the referral for: colonoscopy  Please call if you would want to be referred for your leg vein treatment  Please go to the LAB in the Basement (turn left off the elevator) for the tests to be done today  You will be contacted by phone if any changes need to be made immediately.  Otherwise, you will receive a letter about your results with an explanation, but please check with MyChart first.  Please remember to sign up for MyChart if you have not done so, as this will be important to you in the future with finding out test results, communicating by private email, and scheduling acute appointments online when needed.  Please return in 1 year for your yearly visit, or sooner if needed, with Lab testing done 3-5 days before

## 2015-10-18 NOTE — Telephone Encounter (Signed)
Pt called and was just in for an appt with Dr Jenny Reichmann. She said her throat is sore and red. She wanted to know if Dr Jenny Reichmann could call her in a prescription. Pharmacy is [facility]. Please advise thanks.

## 2015-10-25 DIAGNOSIS — I872 Venous insufficiency (chronic) (peripheral): Secondary | ICD-10-CM | POA: Insufficient documentation

## 2015-10-25 NOTE — Assessment & Plan Note (Signed)
stable overall by history and exam, recent data reviewed with pt, and pt to continue medical treatment as before,  to f/u any worsening symptoms or concerns Lab Results  Component Value Date   TSH 5.41 (H) 10/18/2015

## 2015-10-25 NOTE — Assessment & Plan Note (Signed)
Chronic persistent, overall stable, cont same tx, declines need for counseling or other tx

## 2015-10-25 NOTE — Assessment & Plan Note (Signed)
Mild to mod, for antibx course,  to f/u any worsening symptoms or concerns  In addition to the time spent performing CPE, I spent an additional 25 minutes face to face,in which greater than 50% of this time was spent in counseling and coordination of care for patient's acute illness as documented.  

## 2015-10-25 NOTE — Assessment & Plan Note (Signed)

## 2015-10-25 NOTE — Assessment & Plan Note (Signed)
Mild, no specific tx needed except for leg elevation, low salt diet, wt control, consider support hose vs compression stockings.

## 2015-10-26 ENCOUNTER — Other Ambulatory Visit: Payer: Self-pay | Admitting: Family Medicine

## 2015-10-26 NOTE — Telephone Encounter (Signed)
Refill done.  

## 2015-11-01 ENCOUNTER — Encounter: Payer: Self-pay | Admitting: Internal Medicine

## 2015-11-22 ENCOUNTER — Other Ambulatory Visit: Payer: Self-pay | Admitting: Family Medicine

## 2015-11-23 NOTE — Telephone Encounter (Signed)
Refill done.  

## 2015-11-28 ENCOUNTER — Encounter: Payer: Self-pay | Admitting: Internal Medicine

## 2015-12-16 ENCOUNTER — Other Ambulatory Visit: Payer: Self-pay | Admitting: Internal Medicine

## 2015-12-16 NOTE — Telephone Encounter (Signed)
faxed

## 2015-12-22 ENCOUNTER — Other Ambulatory Visit: Payer: Self-pay | Admitting: Family Medicine

## 2016-01-08 ENCOUNTER — Other Ambulatory Visit: Payer: Self-pay | Admitting: Family Medicine

## 2016-01-09 NOTE — Telephone Encounter (Signed)
Refill done.  

## 2016-01-22 ENCOUNTER — Other Ambulatory Visit: Payer: Self-pay | Admitting: Internal Medicine

## 2016-01-24 ENCOUNTER — Other Ambulatory Visit: Payer: Self-pay | Admitting: Family Medicine

## 2016-01-25 NOTE — Telephone Encounter (Signed)
Refill done.  

## 2016-02-03 ENCOUNTER — Telehealth: Payer: Self-pay | Admitting: Internal Medicine

## 2016-02-03 ENCOUNTER — Encounter: Payer: Self-pay | Admitting: Internal Medicine

## 2016-02-03 NOTE — Telephone Encounter (Signed)
Ok to change providers per Dr Carlean Purl.

## 2016-02-03 NOTE — Telephone Encounter (Signed)
Patient has been scheduled for recall colon February 14,2018.

## 2016-02-03 NOTE — Telephone Encounter (Signed)
I accept 

## 2016-02-21 ENCOUNTER — Other Ambulatory Visit: Payer: Self-pay | Admitting: Family Medicine

## 2016-02-21 NOTE — Telephone Encounter (Signed)
Refill done.  

## 2016-03-07 ENCOUNTER — Ambulatory Visit (AMBULATORY_SURGERY_CENTER): Payer: Self-pay | Admitting: *Deleted

## 2016-03-07 VITALS — Ht 66.0 in | Wt 138.0 lb

## 2016-03-07 DIAGNOSIS — Z1211 Encounter for screening for malignant neoplasm of colon: Secondary | ICD-10-CM

## 2016-03-07 NOTE — Progress Notes (Signed)
Patient denies any allergies to eggs or soy. Patient denies any problems with sedation. Post-op Nausea per pt. Patient denies any oxygen use at home and does not take any diet/weight loss medications.

## 2016-03-08 ENCOUNTER — Encounter: Payer: Self-pay | Admitting: Internal Medicine

## 2016-03-18 ENCOUNTER — Other Ambulatory Visit: Payer: Self-pay | Admitting: Family Medicine

## 2016-03-19 NOTE — Telephone Encounter (Signed)
Refill done.  

## 2016-03-21 ENCOUNTER — Ambulatory Visit (AMBULATORY_SURGERY_CENTER): Payer: 59 | Admitting: Internal Medicine

## 2016-03-21 ENCOUNTER — Encounter: Payer: Self-pay | Admitting: Internal Medicine

## 2016-03-21 VITALS — BP 112/70 | HR 65 | Temp 97.3°F | Resp 12 | Ht 66.0 in | Wt 138.0 lb

## 2016-03-21 DIAGNOSIS — Z1212 Encounter for screening for malignant neoplasm of rectum: Secondary | ICD-10-CM

## 2016-03-21 DIAGNOSIS — Z1211 Encounter for screening for malignant neoplasm of colon: Secondary | ICD-10-CM

## 2016-03-21 MED ORDER — SODIUM CHLORIDE 0.9 % IV SOLN: 500.0000 mL | INTRAVENOUS | Status: DC

## 2016-03-21 NOTE — Progress Notes (Signed)
Pt's states no medical or surgical changes since previsit or office visit. 

## 2016-03-21 NOTE — Op Note (Signed)
[facility] Patient Name: [name] Procedure Date: 03/21/2016 9:16 AM MRN: [id number] Endoscopist: Gatha Mayer , MD Age: 56 Referring MD:  Date of Birth: Jun 06, 1960 Gender: Female Account #: [id number] Procedure:                Colonoscopy Indications:              Screening for colorectal malignant neoplasm Medicines:                Propofol per Anesthesia, Monitored Anesthesia Care Procedure:                Pre-Anesthesia Assessment:                           - Prior to the procedure, a History and Physical                            was performed, and patient medications and                            allergies were reviewed. The patient's tolerance of                            previous anesthesia was also reviewed. The risks                            and benefits of the procedure and the sedation                            options and risks were discussed with the patient.                            All questions were answered, and informed consent                            was obtained. Prior Anticoagulants: The patient has                            taken no previous anticoagulant or antiplatelet                            agents. ASA Grade Assessment: II - A patient with                            mild systemic disease. After reviewing the risks                            and benefits, the patient was deemed in                            satisfactory condition to undergo the procedure.                           After obtaining informed consent, the colonoscope  was passed under direct vision. Throughout the                            procedure, the patient's blood pressure, pulse, and                            oxygen saturations were monitored continuously. The                            Model PCF-H190L [id number]) scope was introduced                            through the anus and advanced to the the cecum,             identified by appendiceal orifice and ileocecal                            valve. The colonoscopy was performed without                            difficulty. The patient tolerated the procedure                            well. The quality of the bowel preparation was                            excellent. The bowel preparation used was Miralax.                            The ileocecal valve, appendiceal orifice, and                            rectum were photographed. Scope In: 9:21:13 AM Scope Out: 9:34:51 AM Scope Withdrawal Time: 0 hours 8 minutes 32 seconds  Total Procedure Duration: 0 hours 13 minutes 38 seconds  Findings:                 The perianal and digital rectal examinations were                            normal.                           The entire examined colon appeared normal on direct                            and retroflexion views. Complications:            No immediate complications. Estimated Blood Loss:     Estimated blood loss: none. Impression:               - The entire examined colon is normal on direct and                            retroflexion views.                           -  No specimens collected. Recommendation:           - Patient has a contact number available for                            emergencies. The signs and symptoms of potential                            delayed complications were discussed with the                            patient. Return to normal activities tomorrow.                            Written discharge instructions were provided to the                            patient.                           - Patient has a contact number available for                            emergencies. The signs and symptoms of potential                            delayed complications were discussed with the                            patient. Return to normal activities tomorrow.                            Written discharge  instructions were provided to the                            patient.                           - Resume previous diet.                           - Continue present medications.                           - Repeat colonoscopy in 10 years for screening                            purposes. Gatha Mayer, MD 03/21/2016 9:41:58 AM This report has been signed electronically.

## 2016-03-21 NOTE — Patient Instructions (Addendum)
The colonoscopy was normal.  Next routine colonoscopy/screening test in 10 years - 2028  I appreciate the opportunity to care for you. Gatha Mayer, MD, FACG  YOU HAD AN ENDOSCOPIC PROCEDURE TODAY AT [facility]:   Refer to the procedure report that was given to you for any specific questions about what was found during the examination.  If the procedure report does not answer your questions, please call your gastroenterologist to clarify.  If you requested that your care partner not be given the details of your procedure findings, then the procedure report has been included in a sealed envelope for you to review at your convenience later.  YOU SHOULD EXPECT: Some feelings of bloating in the abdomen. Passage of more gas than usual.  Walking can help get rid of the air that was put into your GI tract during the procedure and reduce the bloating. If you had a lower endoscopy (such as a colonoscopy or flexible sigmoidoscopy) you may notice spotting of blood in your stool or on the toilet paper. If you underwent a bowel prep for your procedure, you may not have a normal bowel movement for a few days.  Please Note:  You might notice some irritation and congestion in your nose or some drainage.  This is from the oxygen used during your procedure.  There is no need for concern and it should clear up in a day or so.  SYMPTOMS TO REPORT IMMEDIATELY:   Following lower endoscopy (colonoscopy or flexible sigmoidoscopy):  Excessive amounts of blood in the stool  Significant tenderness or worsening of abdominal pains  Swelling of the abdomen that is new, acute  Fever of 100F or higher   Following upper endoscopy (EGD)  Vomiting of blood or coffee ground material  New chest pain or pain under the shoulder blades  Painful or persistently difficult swallowing  New shortness of breath  Fever of 100F or higher  Black, tarry-looking stools  For urgent or emergent issues, a  gastroenterologist can be reached at any hour by calling [phone].   DIET:  We do recommend a small meal at first, but then you may proceed to your regular diet.  Drink plenty of fluids but you should avoid alcoholic beverages for 24 hours.  ACTIVITY:  You should plan to take it easy for the rest of today and you should NOT DRIVE or use heavy machinery until tomorrow (because of the sedation medicines used during the test).    FOLLOW UP: Our staff will call the number listed on your records the next business day following your procedure to check on you and address any questions or concerns that you may have regarding the information given to you following your procedure. If we do not reach you, we will leave a message.  However, if you are feeling well and you are not experiencing any problems, there is no need to return our call.  We will assume that you have returned to your regular daily activities without incident.  If any biopsies were taken you will be contacted by phone or by letter within the next 1-3 weeks.  Please call us at [phone] if you have not heard about the biopsies in 3 weeks.    SIGNATURES/CONFIDENTIALITY: You and/or your care partner have signed paperwork which will be entered into your electronic medical record.  These signatures attest to the fact that that the information above on your After Visit Summary has been reviewed and  is understood.  Full responsibility of the confidentiality of this discharge information lies with you and/or your care-partner.

## 2016-03-21 NOTE — Progress Notes (Deleted)
Called to room to assist during endoscopic procedure.  Patient ID and intended procedure confirmed with present staff. Received instructions for my participation in the procedure from the performing physician.  

## 2016-03-21 NOTE — Progress Notes (Signed)
A/ox3, pleased with MAC, report to RN 

## 2016-03-22 ENCOUNTER — Telehealth: Payer: Self-pay

## 2016-03-22 NOTE — Telephone Encounter (Signed)
  Follow up Call-  Call back number 03/21/2016  Post procedure Call Back phone  # [phone]  Permission to leave phone message Yes  Some recent data might be hidden     Patient questions:  Do you have a fever, pain , or abdominal swelling? No. Pain Score  0 *  Have you tolerated food without any problems? Yes.    Have you been able to return to your normal activities? Yes.    Do you have any questions about your discharge instructions: Diet   No. Medications  No. Follow up visit  No.  Do you have questions or concerns about your Care? Yes.  Patient states that she has had a small amount of diarrhea post procedure. Instructed patient this should resolve, if it does not resolve and she has any questions, please call us back.  Actions: * If pain score is 4 or above: No action needed, pain <4.

## 2016-03-27 ENCOUNTER — Telehealth: Payer: Self-pay

## 2016-03-27 NOTE — Telephone Encounter (Signed)
Advised patient prolia injection is due either on/after 2/22---I have verified insurance, estimated $0 copay due for prolia injection---patient will call back to schedule nurse visit---can talk with Orilla Templeman if any questions

## 2016-04-16 ENCOUNTER — Ambulatory Visit (INDEPENDENT_AMBULATORY_CARE_PROVIDER_SITE_OTHER): Payer: 59

## 2016-04-16 DIAGNOSIS — M810 Age-related osteoporosis without current pathological fracture: Secondary | ICD-10-CM

## 2016-04-16 MED ORDER — DENOSUMAB 60 MG/ML ~~LOC~~ SOLN
60.0000 mg | Freq: Once | SUBCUTANEOUS | Status: AC
  Administered 2016-04-16: 60 mg via SUBCUTANEOUS

## 2016-05-07 ENCOUNTER — Other Ambulatory Visit: Payer: Self-pay | Admitting: Internal Medicine

## 2016-05-08 NOTE — Telephone Encounter (Signed)
Done hardcopy to Shirron  

## 2016-05-08 NOTE — Telephone Encounter (Signed)
faxed

## 2016-06-05 ENCOUNTER — Ambulatory Visit: Payer: 59 | Admitting: Family Medicine

## 2016-07-22 ENCOUNTER — Other Ambulatory Visit: Payer: Self-pay | Admitting: Family Medicine

## 2016-07-23 NOTE — Telephone Encounter (Signed)
Refill denied. Pt needs an appt. 

## 2016-08-06 DIAGNOSIS — H04123 Dry eye syndrome of bilateral lacrimal glands: Secondary | ICD-10-CM | POA: Diagnosis not present

## 2016-08-06 DIAGNOSIS — H524 Presbyopia: Secondary | ICD-10-CM | POA: Diagnosis not present

## 2016-08-14 ENCOUNTER — Other Ambulatory Visit: Payer: Self-pay | Admitting: Family Medicine

## 2016-08-14 NOTE — Telephone Encounter (Signed)
Refill denied. Pt has not been seen in over a year.  

## 2016-08-17 DIAGNOSIS — I8311 Varicose veins of right lower extremity with inflammation: Secondary | ICD-10-CM | POA: Diagnosis not present

## 2016-08-17 DIAGNOSIS — I8312 Varicose veins of left lower extremity with inflammation: Secondary | ICD-10-CM | POA: Diagnosis not present

## 2016-08-17 DIAGNOSIS — I83813 Varicose veins of bilateral lower extremities with pain: Secondary | ICD-10-CM | POA: Diagnosis not present

## 2016-08-23 DIAGNOSIS — I83811 Varicose veins of right lower extremities with pain: Secondary | ICD-10-CM | POA: Diagnosis not present

## 2016-08-30 ENCOUNTER — Ambulatory Visit (INDEPENDENT_AMBULATORY_CARE_PROVIDER_SITE_OTHER): Payer: 59 | Admitting: Family Medicine

## 2016-08-30 ENCOUNTER — Ambulatory Visit: Payer: Self-pay

## 2016-08-30 ENCOUNTER — Encounter: Payer: Self-pay | Admitting: Family Medicine

## 2016-08-30 VITALS — BP 94/72 | HR 73 | Ht 66.0 in | Wt 135.0 lb

## 2016-08-30 DIAGNOSIS — M25551 Pain in right hip: Secondary | ICD-10-CM

## 2016-08-30 DIAGNOSIS — M7061 Trochanteric bursitis, right hip: Secondary | ICD-10-CM | POA: Diagnosis not present

## 2016-08-30 DIAGNOSIS — M999 Biomechanical lesion, unspecified: Secondary | ICD-10-CM | POA: Diagnosis not present

## 2016-08-30 DIAGNOSIS — M5090 Cervical disc disorder, unspecified, unspecified cervical region: Secondary | ICD-10-CM

## 2016-08-30 MED ORDER — GABAPENTIN 100 MG PO CAPS: 200.0000 mg | ORAL_CAPSULE | Freq: Every day | ORAL | 3 refills | Status: DC

## 2016-08-30 NOTE — Progress Notes (Signed)
[name] [address], [place] [address] Phone: [phone] Subjective:    I'm seeing this patient by the request  of:    CC: Neck pain and right hip pain  [id number]  [name] [name] is a 56 y.o. female coming in with complaint of neck pain. Has known history of cervical disc disease. Patient states that having worsening symptoms with some mild radiation going down the right arm. Very intermittent. Patient denies any numbness or weakness. Patient states that it is becoming more severe. Rates the severity is 6 out of 10  Patient is also complaining of more of a right hip pain. Lateral aspect. We have seen her for greater trochanter bursitis. Worsening symptoms. Waking her up at night. Difficult he continuing to work out. Rates the severity of pain as 5 out of 10 and worsening       Past Medical History:  Diagnosis Date  . Allergic rhinitis   . Anemia    history of anemia secondary to fibroid sx  . Anxiety and depression   . Cervical disc disease 07/20/2010  . Cyst of ovary    bilateral  . Foot fracture, left    hx of  . Hyperlipidemia   . Hypothyroidism   . IBS (irritable bowel syndrome)   . Iron deficiency 07/20/2010  . Low back pain   . Lumbar disc disease 07/20/2010  . Osteoporosis 07/20/2010  . Post-operative nausea and vomiting   . 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 Vitamin D deficiency 07/20/2010   Past Surgical History:  Procedure Laterality Date  . COLONOSCOPY    . fibroid sx    . right lateral epicondylar    . TRIGGER FINGER RELEASE  2016  . VAGINAL HYSTERECTOMY    . WRIST FRACTURE SURGERY Left 05/25/2014   Social History   Social History  . Marital status: Married    Spouse name: N/A  . Number of children: N/A  . Years of education: N/A   Occupational History  . cna    Social History Main Topics  . Smoking status: Former Research scientist (life sciences)  . Smokeless tobacco: Never Used     Comment: quit 30 years ago  . Alcohol  use No  . Drug use: No  . Sexual activity: Not Asked   Other Topics Concern  . 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         Allergies  Allergen Reactions  . Sulfa Antibiotics Other (See Comments)  . Sulfonamide Derivatives Other (See Comments)    Pt unknown reaction    Family History  Problem Relation Age of Onset  . Alcohol abuse Mother   . Alcohol abuse Father   . Heart attack Father        died suddenly at home with presumed MI  . Colon cancer Neg Hx      Past medical history, social, surgical and family history all reviewed in electronic medical record.  No pertanent information unless stated regarding to the chief complaint.   Review of Systems:Review of systems updated and as accurate as of 08/30/16  No headache, visual changes, nausea, vomiting, diarrhea, constipation, dizziness, abdominal pain, skin rash, fevers, chills, night sweats, weight loss, swollen lymph nodes, body aches, joint swelling, muscle aches, chest pain, shortness of breath, mood changes. Positive muscle aches  Objective  Blood  pressure 94/72, pulse 73, height 5\' 6"  (1.676 m), weight 135 lb (61.2 kg), SpO2 98 %. Systems examined below as of 08/30/16   General: No apparent distress alert and oriented x3 mood and affect normal, dressed appropriately.  HEENT: Pupils equal, extraocular movements intact  Respiratory: Patient's speak in full sentences and does not appear short of breath  Cardiovascular: No lower extremity edema, non tender, no erythema  Skin: Warm dry intact with no signs of infection or rash on extremities or on axial skeleton.  Abdomen: Soft nontender  Neuro: Cranial nerves II through XII are intact, neurovascularly intact in all extremities with 2+ DTRs and 2+ pulses.  Lymph: No lymphadenopathy of posterior or anterior cervical chain or axillae bilaterally.  Gait  normal with good balance and coordination.  MSK:  Non tender with full range of motion and good stability and symmetric strength and tone of shoulders, elbows, wrist,  knee and ankles bilaterally.  Neck: Inspection unremarkable. No palpable stepoffs. Negative Spurling's maneuver. Mild limitation in rotation of 10 bilaterally. Minimal sidebending to the right Grip strength and sensation normal in bilateral hands Strength good C4 to T1 distribution No sensory change to C4 to T1 Negative Hoffman sign bilaterally Reflexes normal  Right hip exam shows full range of motion. Severely tender to palpation over the lateral aspect of the hip. 4 out of 5 strength with hip abductor. Neurovascular intact distally.   Procedure: Real-time Ultrasound Guided Injection of right greater trochanteric bursitis secondary to patient's body habitus Device: GE Logiq Q7 Ultrasound guided injection is preferred based studies that show increased duration, increased effect, greater accuracy, decreased procedural pain, increased response rate, and decreased cost with ultrasound guided versus blind injection.  Verbal informed consent obtained.  Time-out conducted.  Noted no overlying erythema, induration, or other signs of local infection.  Skin prepped in a sterile fashion.  Local anesthesia: Topical Ethyl chloride.  With sterile technique and under real time ultrasound guidance:  Greater trochanteric area was visualized and patient's bursa was noted. A 22-gauge 3 inch needle was inserted and 4 cc of 0.5% Marcaine and 1 cc of Kenalog 40 mg/dL was injected. Pictures taken Completed without difficulty  Pain immediately resolved suggesting accurate placement of the medication.  Advised to call if fevers/chills, erythema, induration, drainage, or persistent bleeding.  Images permanently stored and available for review in the ultrasound unit.  Impression: Technically successful ultrasound guided injection.     Osteopathic findings  C6 flexed rotated and side bent left T4 extended rotated and side bent left inhaled rib T9 extended rotated and side bent left   Impression and Recommendations:     This case required medical decision making of moderate complexity.      Note: This dictation was prepared with Dragon dictation along with smaller phrase technology. Any transcriptional errors that result from this process are unintentional.

## 2016-08-30 NOTE — Patient Instructions (Signed)
Good to see you  [name] is your friend.  New exercises 3 times a week.  Gabapentin 200mg  at night I hope the injection helps See me again I n6 weeks if not perfect

## 2016-08-30 NOTE — Assessment & Plan Note (Signed)
Decision today to treat with OMT was based on Physical Exam  After verbal consent patient was treated with HVLA, ME, FPR techniques in cervical, thoracic, rib areas  Patient tolerated the procedure well with improvement in symptoms  Patient given exercises, stretches and lifestyle modifications  See medications in patient instructions if given  Patient will follow up in 4 weeks 

## 2016-08-30 NOTE — Assessment & Plan Note (Signed)
Patient given injection and tolerated the procedure well. We have done this in the past. Open patient will do well. Encourage her to do the exercises a more regular basis. Topical anti-implant towards prescribed again. Worsening symptoms we'll consider further imaging.

## 2016-08-30 NOTE — Assessment & Plan Note (Signed)
Patient last x-rays were independently visualized by me within 2012. Very minimal arthritic changes of the time. We will hold on any further imaging but may need to consider an follow-up. Discussed with patient icing regimen and home exercises. Discussed which activities doing which ones to avoid. Patient will work on posture. Responded well to osteopathic manipulation.

## 2016-09-03 ENCOUNTER — Ambulatory Visit: Payer: 59 | Admitting: Family Medicine

## 2016-09-12 ENCOUNTER — Telehealth: Payer: Self-pay | Admitting: Family Medicine

## 2016-09-12 MED ORDER — PREDNISONE 50 MG PO TABS: 50.0000 mg | ORAL_TABLET | Freq: Every day | ORAL | 0 refills | Status: DC

## 2016-09-12 NOTE — Telephone Encounter (Signed)
rx phoned in but do need to see her if not resolved by Friday, do not want to use too much with hx of osteoporosis.

## 2016-09-12 NOTE — Telephone Encounter (Signed)
Pt called stating she was seen a couple weeks ago, and Dr. Tamala Julian knows she has back problems, she states she was doing yard work and injured it, she would like to know if Dr. Tamala Julian can prescribe her Prednisone, she claims this normally helps her back Please advise

## 2016-09-12 NOTE — Telephone Encounter (Signed)
Pt informed of Meds called in and Zachs message about coming in if not resolved.

## 2016-09-18 DIAGNOSIS — N760 Acute vaginitis: Secondary | ICD-10-CM | POA: Diagnosis not present

## 2016-09-18 DIAGNOSIS — N390 Urinary tract infection, site not specified: Secondary | ICD-10-CM | POA: Diagnosis not present

## 2016-10-14 ENCOUNTER — Other Ambulatory Visit: Payer: Self-pay | Admitting: Internal Medicine

## 2016-10-15 ENCOUNTER — Other Ambulatory Visit (INDEPENDENT_AMBULATORY_CARE_PROVIDER_SITE_OTHER): Payer: Self-pay

## 2016-10-15 DIAGNOSIS — Z0001 Encounter for general adult medical examination with abnormal findings: Secondary | ICD-10-CM

## 2016-10-15 LAB — BASIC METABOLIC PANEL
BUN: 19 mg/dL (ref 6–23)
CHLORIDE: 105 meq/L (ref 96–112)
CO2: 30 mEq/L (ref 19–32)
Calcium: 9.2 mg/dL (ref 8.4–10.5)
Creatinine, Ser: 0.73 mg/dL (ref 0.40–1.20)
GFR: 87.34 mL/min (ref >60.00)
GLUCOSE: 94 mg/dL (ref 70–99)
POTASSIUM: 3.7 meq/L (ref 3.5–5.1)
SODIUM: 143 meq/L (ref 135–145)

## 2016-10-15 LAB — URINALYSIS, ROUTINE W REFLEX MICROSCOPIC
Bilirubin Urine: NEGATIVE
Hgb urine dipstick: NEGATIVE
Ketones, ur: NEGATIVE
Leukocytes, UA: NEGATIVE
Nitrite: NEGATIVE
PH: 6 (ref 5.0–8.0)
RBC / HPF: NONE SEEN (ref 0–?)
SPECIFIC GRAVITY, URINE: 1.02 (ref 1.000–1.030)
TOTAL PROTEIN, URINE-UPE24: NEGATIVE
URINE GLUCOSE: NEGATIVE
Urobilinogen, UA: 0.2 (ref 0.0–1.0)
WBC, UA: NONE SEEN (ref 0–?)

## 2016-10-15 LAB — LIPID PANEL
Cholesterol: 214 mg/dL — ABNORMAL HIGH (ref 0–200)
HDL: 88.1 mg/dL (ref >39.00)
LDL Cholesterol: 116 mg/dL — ABNORMAL HIGH (ref 0–99)
NonHDL: 125.44
TRIGLYCERIDES: 47 mg/dL (ref 0.0–149.0)
Total CHOL/HDL Ratio: 2
VLDL: 9.4 mg/dL (ref 0.0–40.0)

## 2016-10-15 LAB — HEPATIC FUNCTION PANEL
ALBUMIN: 4.4 g/dL (ref 3.5–5.2)
ALK PHOS: 33 U/L — AB (ref 39–117)
ALT: 10 U/L (ref 0–35)
AST: 16 U/L (ref 0–37)
BILIRUBIN TOTAL: 0.4 mg/dL (ref 0.2–1.2)
Bilirubin, Direct: 0.1 mg/dL (ref 0.0–0.3)
Total Protein: 6.7 g/dL (ref 6.0–8.3)

## 2016-10-15 LAB — CBC WITH DIFFERENTIAL/PLATELET
BASOS PCT: 0.9 % (ref 0.0–3.0)
Basophils Absolute: 0 10*3/uL (ref 0.0–0.1)
EOS PCT: 3.1 % (ref 0.0–5.0)
Eosinophils Absolute: 0.1 10*3/uL (ref 0.0–0.7)
HCT: 38.6 % (ref 36.0–46.0)
Hemoglobin: 13 g/dL (ref 12.0–15.0)
Lymphocytes Relative: 43.7 % (ref 12.0–46.0)
Lymphs Abs: 2.1 10*3/uL (ref 0.7–4.0)
MCHC: 33.5 g/dL (ref 30.0–36.0)
MCV: 93.5 fl (ref 78.0–100.0)
MONO ABS: 0.4 10*3/uL (ref 0.1–1.0)
Monocytes Relative: 9.3 % (ref 3.0–12.0)
NEUTROS PCT: 43 % (ref 43.0–77.0)
Neutro Abs: 2 10*3/uL (ref 1.4–7.7)
Platelets: 181 10*3/uL (ref 150.0–400.0)
RBC: 4.13 Mil/uL (ref 3.87–5.11)
RDW: 13.4 % (ref 11.5–15.5)
WBC: 4.7 10*3/uL (ref 4.0–10.5)

## 2016-10-15 LAB — TSH: TSH: 4 u[IU]/mL (ref 0.35–4.50)

## 2016-10-16 MED ORDER — LEVOTHYROXINE SODIUM 75 MCG PO TABS: 75.0000 ug | ORAL_TABLET | ORAL | 2 refills | Status: DC

## 2016-10-16 MED ORDER — LEVOTHYROXINE SODIUM 50 MCG PO TABS: 50.0000 ug | ORAL_TABLET | ORAL | 2 refills | Status: DC

## 2016-10-16 NOTE — Addendum Note (Signed)
Addended by: Juliet Rude on: 10/16/2016 07:43 AM   Modules accepted: Orders

## 2016-10-18 ENCOUNTER — Ambulatory Visit (INDEPENDENT_AMBULATORY_CARE_PROVIDER_SITE_OTHER): Payer: 59 | Admitting: Internal Medicine

## 2016-10-18 ENCOUNTER — Encounter: Payer: Self-pay | Admitting: Internal Medicine

## 2016-10-18 VITALS — BP 106/62 | HR 65 | Temp 97.9°F | Ht 66.0 in | Wt 134.0 lb

## 2016-10-18 DIAGNOSIS — R21 Rash and other nonspecific skin eruption: Secondary | ICD-10-CM

## 2016-10-18 DIAGNOSIS — Z23 Encounter for immunization: Secondary | ICD-10-CM | POA: Diagnosis not present

## 2016-10-18 DIAGNOSIS — Z0000 Encounter for general adult medical examination without abnormal findings: Secondary | ICD-10-CM

## 2016-10-18 DIAGNOSIS — Z0001 Encounter for general adult medical examination with abnormal findings: Secondary | ICD-10-CM | POA: Diagnosis not present

## 2016-10-18 MED ORDER — ELETRIPTAN HYDROBROMIDE 40 MG PO TABS: 40.0000 mg | ORAL_TABLET | Freq: Every day | ORAL | 5 refills | Status: DC | PRN

## 2016-10-18 MED ORDER — LEVOTHYROXINE SODIUM 75 MCG PO TABS: 75.0000 ug | ORAL_TABLET | ORAL | 3 refills | Status: DC

## 2016-10-18 MED ORDER — MELOXICAM 15 MG PO TABS: ORAL_TABLET | ORAL | 3 refills | Status: DC

## 2016-10-18 MED ORDER — NADOLOL 20 MG PO TABS: ORAL_TABLET | ORAL | 3 refills | Status: DC

## 2016-10-18 MED ORDER — ASPIRIN EC 81 MG PO TBEC: 81.0000 mg | DELAYED_RELEASE_TABLET | Freq: Every day | ORAL | Status: DC

## 2016-10-18 MED ORDER — LEVOTHYROXINE SODIUM 50 MCG PO TABS: 50.0000 ug | ORAL_TABLET | ORAL | 3 refills | Status: DC

## 2016-10-18 MED ORDER — ZOLPIDEM TARTRATE ER 6.25 MG PO TBCR: 6.2500 mg | EXTENDED_RELEASE_TABLET | Freq: Every evening | ORAL | 1 refills | Status: DC | PRN

## 2016-10-18 MED ORDER — TRIAMCINOLONE ACETONIDE 0.1 % EX CREA: 1.0000 "application " | TOPICAL_CREAM | Freq: Two times a day (BID) | CUTANEOUS | 0 refills | Status: AC
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
## 2016-10-18 NOTE — Patient Instructions (Addendum)
You had the flu shot today  Please take all new medication as prescribed - the steroid shot  Please continue all other medications as before, and refills have been done if requested.  Please have the pharmacy call with any other refills you may need.  Please continue your efforts at being more active, low cholesterol diet, and weight control.  You are otherwise up to date with prevention measures today.  Please keep your appointments with your specialists as you may have planned  Please return in 1 year for your yearly visit, or sooner if needed, with Lab testing done 3-5 days before

## 2016-10-20 DIAGNOSIS — R21 Rash and other nonspecific skin eruption: Secondary | ICD-10-CM | POA: Insufficient documentation
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
## 2016-10-20 NOTE — Assessment & Plan Note (Signed)

## 2016-10-24 ENCOUNTER — Telehealth: Payer: Self-pay

## 2016-10-24 NOTE — Telephone Encounter (Signed)
Left message asking patient to call back to advise if she wants prolia rx sent to her pharm, so she can use her pharmacy benefits to pick it up and then bring to our office for administration of med---can talk with tamara if any questions---insurance has been verified, estimated $0 copay---can schedule nurse visit at her earliest convenience, last injection date was 04/16/16

## 2016-10-25 ENCOUNTER — Ambulatory Visit (INDEPENDENT_AMBULATORY_CARE_PROVIDER_SITE_OTHER): Payer: 59

## 2016-10-25 DIAGNOSIS — M810 Age-related osteoporosis without current pathological fracture: Secondary | ICD-10-CM | POA: Diagnosis not present

## 2016-10-25 MED ORDER — DENOSUMAB 60 MG/ML ~~LOC~~ SOLN
60.0000 mg | Freq: Once | SUBCUTANEOUS | Status: AC
  Administered 2016-10-25: 60 mg via SUBCUTANEOUS

## 2016-10-25 NOTE — Progress Notes (Signed)
Medical screening examination/treatment/procedure(s) were performed by non-physician practitioner and as supervising physician I was immediately available for consultation/collaboration. I agree with above. Alitza Cowman, MD   

## 2016-10-31 ENCOUNTER — Other Ambulatory Visit: Payer: Self-pay

## 2016-10-31 MED ORDER — LEVOTHYROXINE SODIUM 75 MCG PO TABS: 75.0000 ug | ORAL_TABLET | ORAL | 3 refills | Status: DC

## 2016-11-05 ENCOUNTER — Ambulatory Visit (INDEPENDENT_AMBULATORY_CARE_PROVIDER_SITE_OTHER): Payer: 59 | Admitting: Family Medicine

## 2016-11-05 ENCOUNTER — Ambulatory Visit (INDEPENDENT_AMBULATORY_CARE_PROVIDER_SITE_OTHER)
Admission: RE | Admit: 2016-11-05 | Discharge: 2016-11-05 | Disposition: A | Discharge status: Home / Self Care | Payer: 59 | Source: Ambulatory Visit | Attending: Family Medicine | Admitting: Family Medicine

## 2016-11-05 ENCOUNTER — Encounter: Payer: Self-pay | Admitting: Family Medicine

## 2016-11-05 VITALS — BP 120/80 | HR 63 | Ht 67.0 in | Wt 135.0 lb

## 2016-11-05 DIAGNOSIS — M542 Cervicalgia: Secondary | ICD-10-CM

## 2016-11-05 DIAGNOSIS — M5090 Cervical disc disorder, unspecified, unspecified cervical region: Secondary | ICD-10-CM | POA: Diagnosis not present

## 2016-11-05 DIAGNOSIS — M999 Biomechanical lesion, unspecified: Secondary | ICD-10-CM | POA: Diagnosis not present

## 2016-11-05 MED ORDER — KETOROLAC TROMETHAMINE 60 MG/2ML IM SOLN
60.0000 mg | Freq: Once | INTRAMUSCULAR | Status: AC
  Administered 2016-11-05: 60 mg via INTRAMUSCULAR

## 2016-11-05 MED ORDER — PREDNISONE 50 MG PO TABS: 50.0000 mg | ORAL_TABLET | Freq: Every day | ORAL | 0 refills | Status: DC

## 2016-11-05 MED ORDER — FLUCONAZOLE 150 MG PO TABS: 150.0000 mg | ORAL_TABLET | Freq: Once | ORAL | 0 refills | Status: AC

## 2016-11-05 MED ORDER — METHYLPREDNISOLONE ACETATE 80 MG/ML IJ SUSP
80.0000 mg | Freq: Once | INTRAMUSCULAR | Status: AC
  Administered 2016-11-05: 80 mg via INTRAMUSCULAR

## 2016-11-05 NOTE — Progress Notes (Signed)
[name] [address], [place] [address] Phone: [phone] Subjective:     CC: Neck pain  [id number]  [name] is a 56 y.o. female coming in with complaint of neck pain. The patient explains Dr. Tamala Julian completed OMT during her last office visit. Says her neck is now popping more than usual. The left side of her neck is very sore and the pain is causing her headaches.   Onset- Friday morning Location- Posterior neck  Duration- All day Character- Achy, sore Aggravating factors- Turning to the left side Reliving factors-  Therapies tried- Heat, Ice, Ibuprofen Severity- 7 at its worse   previous history of neck difficulty and had responded to epidurals long time ago. Feels than steroids have helped as well.  Past Medical History:  Diagnosis Date  . Allergic rhinitis   . Anemia    history of anemia secondary to fibroid sx  . Anxiety and depression   . Cervical disc disease 07/20/2010  . Cyst of ovary    bilateral  . Foot fracture, left    hx of  . Hyperlipidemia   . Hypothyroidism   . IBS (irritable bowel syndrome)   . Iron deficiency 07/20/2010  . Low back pain   . Lumbar disc disease 07/20/2010  . Osteoporosis 07/20/2010  . Post-operative nausea and vomiting   . 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 Vitamin D deficiency 07/20/2010   Past Surgical History:  Procedure Laterality Date  . COLONOSCOPY    . fibroid sx    . right lateral epicondylar    . TRIGGER FINGER RELEASE  2016  . VAGINAL HYSTERECTOMY    . WRIST FRACTURE SURGERY Left 05/25/2014   Social History   Social History  . Marital status: Married    Spouse name: N/A  . Number of children: N/A  . Years of education: N/A   Occupational History  . cna    Social History Main Topics  . Smoking status: Former Research scientist (life sciences)  . Smokeless tobacco: Never Used     Comment: quit 30 years ago  . Alcohol use No  . Drug use: No  . Sexual activity: Not Asked   Other  Topics Concern  . 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         Allergies  Allergen Reactions  . Sulfa Antibiotics Other (See Comments)  . Sulfonamide Derivatives Other (See Comments)    Pt unknown reaction    Family History  Problem Relation Age of Onset  . Alcohol abuse Mother   . Alcohol abuse Father   . Heart attack Father        died suddenly at home with presumed MI  . Colon cancer Neg Hx      Past medical history, social, surgical and family history all reviewed in electronic medical record.  No pertanent information unless stated regarding to the chief complaint.   Review of Systems:Review of systems updated and as accurate as of 11/05/16  No headache, visual changes, nausea, vomiting, diarrhea, constipation, dizziness, abdominal pain, skin rash, fevers, chills, night sweats, weight loss, swollen lymph nodes, body aches, joint swelling, muscle aches, chest pain, shortness of breath, mood changes.   Objective  Blood pressure 120/80, pulse 63, height 5\' 7"  (1.702 m), weight 135 lb (61.2 kg), SpO2 98 %. Systems examined below as  of 11/05/16   General: No apparent distress alert and oriented x3 mood and affect normal, dressed appropriately.  HEENT: Pupils equal, extraocular movements intact  Respiratory: Patient's speak in full sentences and does not appear short of breath  Cardiovascular: No lower extremity edema, non tender, no erythema  Skin: Warm dry intact with no signs of infection or rash on extremities or on axial skeleton.  Abdomen: Soft nontender  Neuro: Cranial nerves II through XII are intact, neurovascularly intact in all extremities with 2+ DTRs and 2+ pulses.  Lymph: No lymphadenopathy of posterior or anterior cervical chain or axillae bilaterally.  Gait normal with good balance and coordination.  MSK:  tender with full range of motion  and good stability and symmetric strength and tone of shoulders, elbows, wrist, hip, knee and ankles bilaterally.  Neck: Inspection loss lordosis. No palpable stepoffs. Negative Spurling's maneuver. Voluntary guarding noted but doesn't lack in all planes Grip strength and sensation normal in bilateral hands Strength good C4 to T1 distribution No sensory change to C4 to T1 Negative Hoffman sign bilaterally Reflexes normal  Osteopathic findings C2 flexed rotated and side bent right C4 flexed rotated and side bent left C6 flexed rotated and side bent left T3 extended rotated and side bent right inhaled third rib    Impression and Recommendations:     This case required medical decision making of moderate complexity.      Note: This dictation was prepared with Dragon dictation along with smaller phrase technology. Any transcriptional errors that result from this process are unintentional.

## 2016-11-05 NOTE — Assessment & Plan Note (Signed)
Known degenerative disc disease. Given prednisone. An attempted osteopathic manipulation. We discussed icing regimen, vision has a muscle relaxer for any breakthrough pain. We discussed worsening symptoms advance imaging may be warranted in a possibility for an MRI. Patient is in agreement with the plan. X-rays pending. Follow-up again in 1-2 weeks

## 2016-11-05 NOTE — Patient Instructions (Signed)
Good to see you  Ice is your friend Ice 20 minutes 2 times daily. Usually after activity and before bed. Xray of neck downstairs 2 injections today  Prednisone daily starting tomorrow for 5 days.  If not a lot better in next 4 days call me and we will consider MRI.  Otherwise see me again in 2 weeks.

## 2016-11-05 NOTE — Assessment & Plan Note (Signed)
Decision today to treat with OMT was based on Physical Exam  After verbal consent patient was treated with HVLA, ME, FPR techniques in cervical, thoracic, rib areas  Patient tolerated the procedure well with improvement in symptoms  Patient given exercises, stretches and lifestyle modifications  See medications in patient instructions if given  Patient will follow up in 12 weeks 

## 2016-12-03 DIAGNOSIS — L438 Other lichen planus: Secondary | ICD-10-CM | POA: Diagnosis not present

## 2016-12-03 DIAGNOSIS — L821 Other seborrheic keratosis: Secondary | ICD-10-CM | POA: Diagnosis not present

## 2016-12-03 DIAGNOSIS — D1801 Hemangioma of skin and subcutaneous tissue: Secondary | ICD-10-CM | POA: Diagnosis not present

## 2017-01-07 ENCOUNTER — Telehealth: Payer: Self-pay | Admitting: Family Medicine

## 2017-01-07 NOTE — Telephone Encounter (Signed)
Spoke to pt, advised her she would need to call medical records.

## 2017-01-07 NOTE — Telephone Encounter (Signed)
Copied from [place]. Topic: Medical Record Request - Patient ROI Request >> Jan 07, 2017  3:53 PM Patrice Paradise wrote: Patient Name/DOB/MRN #: [name][id number] Requestor Name/Agency: Jannetta Quint to take to Dr. Nelva Bush Call Back #: [phone]  Information Requested: Xray of her disc and ppwk on neck pain and disc.  Patient would like a call once the xray and ppwk is ready [phone]. Have an appt with Dr. Nelva Bush first of next week.   Route to [employer] for [employer]. For all other clinics, route to the clinic's PEC Pool.

## 2017-02-09 DIAGNOSIS — M5136 Other intervertebral disc degeneration, lumbar region: Secondary | ICD-10-CM | POA: Diagnosis not present

## 2017-02-09 DIAGNOSIS — M5030 Other cervical disc degeneration, unspecified cervical region: Secondary | ICD-10-CM | POA: Diagnosis not present

## 2017-02-09 DIAGNOSIS — G8929 Other chronic pain: Secondary | ICD-10-CM | POA: Insufficient documentation

## 2017-02-09 DIAGNOSIS — M542 Cervicalgia: Secondary | ICD-10-CM | POA: Diagnosis not present

## 2017-02-14 DIAGNOSIS — Z01419 Encounter for gynecological examination (general) (routine) without abnormal findings: Secondary | ICD-10-CM | POA: Diagnosis not present

## 2017-02-14 DIAGNOSIS — Z6820 Body mass index (BMI) 20.0-20.9, adult: Secondary | ICD-10-CM | POA: Diagnosis not present

## 2017-02-19 DIAGNOSIS — M5033 Other cervical disc degeneration, cervicothoracic region: Secondary | ICD-10-CM | POA: Diagnosis not present

## 2017-02-19 DIAGNOSIS — M5136 Other intervertebral disc degeneration, lumbar region: Secondary | ICD-10-CM | POA: Diagnosis not present

## 2017-04-13 ENCOUNTER — Encounter (HOSPITAL_COMMUNITY): Payer: Self-pay

## 2017-04-13 ENCOUNTER — Other Ambulatory Visit: Payer: Self-pay

## 2017-04-13 ENCOUNTER — Emergency Department (HOSPITAL_COMMUNITY)
Admission: EM | Admit: 2017-04-13 | Discharge: 2017-04-14 | Disposition: A | Discharge status: Home / Self Care | Payer: 59 | Attending: Emergency Medicine | Admitting: Emergency Medicine

## 2017-04-13 ENCOUNTER — Emergency Department (HOSPITAL_COMMUNITY): Payer: 59

## 2017-04-13 DIAGNOSIS — K828 Other specified diseases of gallbladder: Secondary | ICD-10-CM | POA: Diagnosis not present

## 2017-04-13 DIAGNOSIS — K838 Other specified diseases of biliary tract: Secondary | ICD-10-CM | POA: Diagnosis not present

## 2017-04-13 DIAGNOSIS — R112 Nausea with vomiting, unspecified: Secondary | ICD-10-CM | POA: Diagnosis not present

## 2017-04-13 DIAGNOSIS — Z7982 Long term (current) use of aspirin: Secondary | ICD-10-CM | POA: Insufficient documentation

## 2017-04-13 DIAGNOSIS — R1011 Right upper quadrant pain: Secondary | ICD-10-CM | POA: Diagnosis present

## 2017-04-13 DIAGNOSIS — Z87891 Personal history of nicotine dependence: Secondary | ICD-10-CM | POA: Insufficient documentation

## 2017-04-13 DIAGNOSIS — Z79899 Other long term (current) drug therapy: Secondary | ICD-10-CM | POA: Diagnosis not present

## 2017-04-13 LAB — URINALYSIS, ROUTINE W REFLEX MICROSCOPIC
BILIRUBIN URINE: NEGATIVE
GLUCOSE, UA: NEGATIVE mg/dL
HGB URINE DIPSTICK: NEGATIVE
Ketones, ur: 5 mg/dL — AB
Nitrite: NEGATIVE
PROTEIN: 30 mg/dL — AB
Specific Gravity, Urine: 1.024 (ref 1.005–1.030)
pH: 5 (ref 5.0–8.0)

## 2017-04-13 LAB — CBC
HCT: 35.5 % — ABNORMAL LOW (ref 36.0–46.0)
HEMOGLOBIN: 11.9 g/dL — AB (ref 12.0–15.0)
MCH: 31 pg (ref 26.0–34.0)
MCHC: 33.5 g/dL (ref 30.0–36.0)
MCV: 92.4 fL (ref 78.0–100.0)
Platelets: 169 10*3/uL (ref 150–400)
RBC: 3.84 MIL/uL — AB (ref 3.87–5.11)
RDW: 12.9 % (ref 11.5–15.5)
WBC: 4.5 10*3/uL (ref 4.0–10.5)

## 2017-04-13 LAB — COMPREHENSIVE METABOLIC PANEL
ALK PHOS: 35 U/L — AB (ref 38–126)
ALT: 14 U/L (ref 14–54)
ANION GAP: 8 (ref 5–15)
AST: 21 U/L (ref 15–41)
Albumin: 4.2 g/dL (ref 3.5–5.0)
BILIRUBIN TOTAL: 0.3 mg/dL (ref 0.3–1.2)
BUN: 22 mg/dL — ABNORMAL HIGH (ref 6–20)
CALCIUM: 9.2 mg/dL (ref 8.9–10.3)
CO2: 26 mmol/L (ref 22–32)
Chloride: 104 mmol/L (ref 101–111)
Creatinine, Ser: 0.61 mg/dL (ref 0.44–1.00)
GFR calc non Af Amer: >60 mL/min (ref >60)
Glucose, Bld: 117 mg/dL — ABNORMAL HIGH (ref 65–99)
POTASSIUM: 3.9 mmol/L (ref 3.5–5.1)
SODIUM: 138 mmol/L (ref 135–145)
TOTAL PROTEIN: 7.1 g/dL (ref 6.5–8.1)

## 2017-04-13 LAB — LIPASE, BLOOD: Lipase: 62 U/L — ABNORMAL HIGH (ref 11–51)

## 2017-04-13 LAB — I-STAT BETA HCG BLOOD, ED (MC, WL, AP ONLY): I-stat hCG, quantitative: <5 m[IU]/mL (ref <5)

## 2017-04-13 MED ORDER — SODIUM CHLORIDE 0.9 % IV BOLUS (SEPSIS)
1000.0000 mL | Freq: Once | INTRAVENOUS | Status: AC
  Administered 2017-04-13: 1000 mL via INTRAVENOUS

## 2017-04-13 MED ORDER — ONDANSETRON HCL 4 MG/2ML IJ SOLN
4.0000 mg | Freq: Once | INTRAMUSCULAR | Status: AC
  Administered 2017-04-13: 4 mg via INTRAVENOUS
  Filled 2017-04-13: qty 2

## 2017-04-13 MED ORDER — MORPHINE SULFATE (PF) 4 MG/ML IV SOLN
4.0000 mg | Freq: Once | INTRAVENOUS | Status: AC
  Administered 2017-04-13: 4 mg via INTRAVENOUS
  Filled 2017-04-13: qty 1

## 2017-04-13 NOTE — ED Triage Notes (Signed)
States since last pm right flank and abdomen pain voiced with vomiting states pain gets worse then better at times.

## 2017-04-13 NOTE — ED Provider Notes (Signed)
[facility] DEPT Provider Note   CSN: [id number] Arrival date & time: 04/13/17  2052     History   Chief Complaint Chief Complaint  Patient presents with  . Abdominal Pain    HPI [name] is a 57 y.o. female.  The history is provided by the patient and medical records.  Abdominal Pain   Associated symptoms include nausea and vomiting.     57 y.o. F with PMH significant for seasonal allergies, anemia, anxiety, ovarian cysts, HLP, hypothyroidism, IBS, Fe+ deficiency, lumbar disc disease, presenting to the ED for abdominal pain.  Patient reports pain in her right side and wrapping around to her back.  Reports pain is sharp in nature.  She reports associated nausea/vomiting that developed this afternoon.  No fever, chills.  States she has not eaten much today so cannot say if it is worse with eating.  Prior abdominal surgeries include hysterectomy.  Past Medical History:  Diagnosis Date  . Allergic rhinitis   . Anemia    history of anemia secondary to fibroid sx  . Anxiety and depression   . Cervical disc disease 07/20/2010  . Cyst of ovary    bilateral  . Foot fracture, left    hx of  . Hyperlipidemia   . Hypothyroidism   . IBS (irritable bowel syndrome)   . Iron deficiency 07/20/2010  . Low back pain   . Lumbar disc disease 07/20/2010  . Osteoporosis 07/20/2010  . Post-operative nausea and vomiting   . 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 Vitamin D deficiency 07/20/2010    Patient Active Problem List   Diagnosis Date Noted  . Rash 10/20/2016  . Nonallopathic lesion of cervical region 08/30/2016  . Nonallopathic lesion of thoracic region 08/30/2016  . Nonallopathic lesion of rib cage 08/30/2016  . Venous (peripheral) insufficiency 10/25/2015  . 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 Bursitis of right shoulder 06/07/2015  . Left rotator cuff tear 06/07/2015  . Trigger middle finger of right hand 06/07/2015  . Hearing loss  10/07/2014  . Anemia, iron deficiency 02/11/2014  . Acute upper respiratory infection 02/09/2014  . Fatigue 02/09/2014  . Greater trochanteric bursitis 09/09/2013  . Other malaise and fatigue 08/18/2013  . Right hip pain 08/18/2013  . Insomnia 07/20/2010  . Lumbar disc disease 07/20/2010  . Cervical disc disease 07/20/2010  . Vitamin D deficiency 07/20/2010  . Iron deficiency 07/20/2010  . Osteoporosis 07/20/2010  . 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 Preventative health care 07/18/2010  . Palpitations 06/06/2010  . Anal fissure 10/04/2009  . RECTAL BLEEDING 10/04/2009  . NAUSEA 10/04/2009  . BACK PAIN 04/11/2009  . FRACTURE, FOOT 11/12/2008  . HYPERLIPIDEMIA 03/12/2007  . Anxiety state 03/12/2007  . DEPRESSION 03/12/2007  . COMMON MIGRAINE 03/12/2007  . ALLERGIC RHINITIS 03/12/2007  . IBS 03/12/2007  . Pain in thoracic spine 03/12/2007  . LOW BACK PAIN 03/12/2007  . PERIPHERAL EDEMA 03/12/2007  . Hypothyroidism 11/29/2006  . Headache(784.0) 11/29/2006    Past Surgical History:  Procedure Laterality Date  . COLONOSCOPY    . fibroid sx    . right lateral epicondylar    . TRIGGER FINGER RELEASE  2016  . VAGINAL HYSTERECTOMY    . WRIST FRACTURE SURGERY Left 05/25/2014    OB History    No data available       Home Medications    Prior to Admission medications   Medication Sig Start Date End Date Taking? Authorizing Provider  aspirin EC  81 MG tablet Take 1 tablet (81 mg total) by mouth daily. 10/18/16   Biagio Borg, MD  Biotin 1 MG CAPS Take 1 capsule by mouth daily.    [provider]  denosumab (PROLIA) 60 MG/ML SOLN injection Inject 1 Dose into the skin every 6 (six) months.    [provider]  Diclofenac Sodium 2 % SOLN Apply twice daily. 09/09/13   Lyndal Pulley, DO  docusate sodium (COLACE) 100 MG capsule Take 200 mg by mouth daily.    [provider]  eletriptan (RELPAX) 40 MG tablet Take 1 tablet (40 mg total) by mouth daily as  needed for migraine or headache. 10/18/16   Biagio Borg, MD  gabapentin (NEURONTIN) 100 MG capsule Take 2 capsules (200 mg total) by mouth at bedtime. Patient not taking: Reported on 11/05/2016 08/30/16   Lyndal Pulley, DO  IRON PO Take 65 mg by mouth once a week.    [provider]  levothyroxine (SYNTHROID) 50 MCG tablet Take 1 tablet (50 mcg total) by mouth every other day. 10/18/16   Biagio Borg, MD  levothyroxine (SYNTHROID) 75 MCG tablet Take 1 tablet (75 mcg total) by mouth every other day. 10/31/16   Biagio Borg, MD  meloxicam (MOBIC) 15 MG tablet TAKE 1 TABLET EACH DAY. 10/18/16   Biagio Borg, MD  nadolol (CORGARD) 20 MG tablet 1/2 tab by mouth twice per day as needed 10/18/16   Biagio Borg, MD  predniSONE (DELTASONE) 50 MG tablet Take 1 tablet (50 mg total) by mouth daily. 11/05/16   Lyndal Pulley, DO  tretinoin (RETIN-A) 0.05 % cream Apply 1 Dose topically daily as needed. 05/11/14   [provider]  triamcinolone cream (KENALOG) 0.1 % Apply 1 application topically 2 (two) times daily. 10/18/16 10/18/17  Biagio Borg, MD  Vitamin D, Ergocalciferol, (DRISDOL) 50000 units CAPS capsule TAKE ONE CAPSULE ONCE A WEEK. 02/21/16   Lyndal Pulley, DO  VITAMIN E PO Take 1,200 mg by mouth daily.    [provider]  zolpidem (AMBIEN CR) 6.25 MG CR tablet Take 1 tablet (6.25 mg total) by mouth at bedtime as needed. 10/18/16   Biagio Borg, MD    Family History Family History  Problem Relation Age of Onset  . Alcohol abuse Mother   . Alcohol abuse Father   . Heart attack Father        died suddenly at home with presumed MI  . Colon cancer Neg Hx     Social History Social History   Tobacco Use  . Smoking status: Former Research scientist (life sciences)  . Smokeless tobacco: Never Used  . Tobacco comment: quit 30 years ago  Substance Use Topics  . Alcohol use: No  . Drug use: No     Allergies   Sulfa antibiotics and Sulfonamide derivatives   Review of Systems Review of  Systems  Gastrointestinal: Positive for abdominal pain, nausea and vomiting.  All other systems reviewed and are negative.    Physical Exam Updated Vital Signs BP 135/89 (BP Location: Left Arm)   Pulse 73   Temp 97.9 F (36.6 C) (Oral)   Resp 18   Ht 5\' 8"  (1.727 m)   Wt 61.2 kg (135 lb)   SpO2 100%   BMI 20.53 kg/m   Physical Exam  Constitutional: She is oriented to person, place, and time. She appears well-developed and well-nourished.  HENT:  Head: Normocephalic and atraumatic.  Mouth/Throat: Oropharynx  is clear and moist.  Eyes: Conjunctivae and EOM are normal. Pupils are equal, round, and reactive to light.  Neck: Normal range of motion.  Cardiovascular: Normal rate, regular rhythm and normal heart sounds.  Pulmonary/Chest: Effort normal and breath sounds normal.  Abdominal: Soft. Bowel sounds are normal. There is tenderness in the right upper quadrant. There is no rigidity and no guarding.  Musculoskeletal: Normal range of motion.  Neurological: She is alert and oriented to person, place, and time.  Skin: Skin is warm and dry.  Psychiatric: She has a normal mood and affect.  Nursing note and vitals reviewed.    ED Treatments / Results  Labs (all labs ordered are listed, but only abnormal results are displayed) Labs Reviewed  LIPASE, BLOOD - Abnormal; Notable for the following components:      Result Value   Lipase 62 (*)    All other components within normal limits  COMPREHENSIVE METABOLIC PANEL - Abnormal; Notable for the following components:   Glucose, Bld 117 (*)    BUN 22 (*)    Alkaline Phosphatase 35 (*)    All other components within normal limits  CBC - Abnormal; Notable for the following components:   RBC 3.84 (*)    Hemoglobin 11.9 (*)    HCT 35.5 (*)    All other components within normal limits  URINALYSIS, ROUTINE W REFLEX MICROSCOPIC - Abnormal; Notable for the following components:   APPearance CLOUDY (*)    Ketones, ur 5 (*)    Protein,  ur 30 (*)    Leukocytes, UA TRACE (*)    Bacteria, UA RARE (*)    Squamous Epithelial / LPF TOO NUMEROUS TO COUNT (*)    All other components within normal limits  I-STAT BETA HCG BLOOD, ED ([facility], [facility], [facility] ONLY)    EKG  EKG Interpretation None       Radiology US Abdomen Limited Ruq  Result Date: 04/14/2017 CLINICAL DATA:  Right upper quadrant pain EXAM: ULTRASOUND ABDOMEN LIMITED RIGHT UPPER QUADRANT COMPARISON:  None. FINDINGS: Gallbladder: Minimal sludge in the gallbladder. No shadowing stones. Normal wall thickness. Negative sonographic Murphy. Common bile duct: Diameter: Up to 2 mm Liver: No focal lesion identified. Within normal limits in parenchymal echogenicity. Portal vein is patent on color Doppler imaging with normal direction of blood flow towards the liver. IMPRESSION: Small amount of gallbladder sludge. Negative for acute cholecystitis or biliary dilatation Electronically Signed   By: Donavan Foil M.D.   On: 04/14/2017 00:32    Procedures Procedures (including critical care time)  Medications Ordered in ED Medications  sodium chloride 0.9 % bolus 1,000 mL (0 mLs Intravenous Stopped 04/14/17 0112)  morphine 4 MG/ML injection 4 mg (4 mg Intravenous Given 04/13/17 2335)  ondansetron (ZOFRAN) injection 4 mg (4 mg Intravenous Given 04/13/17 2334)  ondansetron (ZOFRAN) injection 4 mg (4 mg Intravenous Given 04/14/17 0135)  ketorolac (TORADOL) 30 MG/ML injection 30 mg (30 mg Intravenous Given 04/14/17 0135)     Initial Impression / Assessment and Plan / ED Course  I have reviewed the triage vital signs and the nursing notes.  Pertinent labs & imaging results that were available during my care of the patient were reviewed by me and considered in my medical decision making (see chart for details).  57 y.o. F here with right upper abdominal pain wrapping around to the back.  Reports nausea/vomiting as well.  Unsure if associated with eating as little PO intake today. She is afebrile,  non-toxic.  TTP in RUQ, otherwise abdominal exam is benign.  Screening labs overall reassuring.  US obtained, sludge in the gallbladder without notes stones or findings of acute cholecystitis.  Patient has improved with medications here.  Was able to tolerate PO.  Can be discharged home with continued symptomatic care.  Recommended bland diet for now, progress back to normal as tolerated.  Close follow-up with PCP.  Discussed plan with patient, she acknowledged understanding and agreed with plan of care.  Return precautions given for new or worsening symptoms.  Final Clinical Impressions(s) / ED Diagnoses   Final diagnoses:  RUQ pain  RUQ abdominal pain  Gallbladder sludge    ED Discharge Orders        Ordered    ondansetron (ZOFRAN ODT) 4 MG disintegrating tablet  Every 8 hours PRN     04/14/17 0345    HYDROcodone-acetaminophen (NORCO/VICODIN) 5-325 MG tablet  Every 4 hours PRN     04/14/17 0345       Larene Pickett, PA-C 04/14/17 0434    Fatima Blank, MD 04/14/17 1155

## 2017-04-14 DIAGNOSIS — K838 Other specified diseases of biliary tract: Secondary | ICD-10-CM | POA: Diagnosis not present

## 2017-04-14 MED ORDER — KETOROLAC TROMETHAMINE 30 MG/ML IJ SOLN
30.0000 mg | Freq: Once | INTRAMUSCULAR | Status: AC
  Administered 2017-04-14: 30 mg via INTRAVENOUS
  Filled 2017-04-14: qty 1

## 2017-04-14 MED ORDER — HYDROCODONE-ACETAMINOPHEN 5-325 MG PO TABS: 1.0000 | ORAL_TABLET | ORAL | 0 refills | Status: DC | PRN

## 2017-04-14 MED ORDER — ONDANSETRON 4 MG PO TBDP: 4.0000 mg | ORAL_TABLET | Freq: Three times a day (TID) | ORAL | 0 refills | Status: DC | PRN

## 2017-04-14 MED ORDER — ONDANSETRON HCL 4 MG/2ML IJ SOLN
4.0000 mg | Freq: Once | INTRAMUSCULAR | Status: AC
  Administered 2017-04-14: 4 mg via INTRAVENOUS
  Filled 2017-04-14: qty 2

## 2017-04-14 NOTE — Discharge Instructions (Signed)
Take the prescribed medication as directed.  Try to follow blandish diet for the next few days. Follow-up with your primary care doctor. Return to the ED for new or worsening symptoms.

## 2017-04-17 DIAGNOSIS — M79642 Pain in left hand: Secondary | ICD-10-CM | POA: Diagnosis not present

## 2017-04-21 NOTE — Progress Notes (Signed)
[name] [address] [place], [place] [address] Phone: [phone] Subjective:    I'm seeing this patient by the request  of:    CC: Pain follow-up.  [id number]  [name] is a 57 y.o. female coming in with complaint of pain.  Patient has had a recurrent greater trochanteric bursitis previously.  Last injection was September 12, 2017.  Some concern for lumbar radiculopathy.   Previous right hip x-rays taking July 2015 did not show any significant bony abnormality.   Past Medical History:  Diagnosis Date  . Allergic rhinitis   . Anemia    history of anemia secondary to fibroid sx  . Anxiety and depression   . Cervical disc disease 07/20/2010  . Cyst of ovary    bilateral  . Foot fracture, left    hx of  . Hyperlipidemia   . Hypothyroidism   . IBS (irritable bowel syndrome)   . Iron deficiency 07/20/2010  . Low back pain   . Lumbar disc disease 07/20/2010  . Osteoporosis 07/20/2010  . Post-operative nausea and vomiting   . 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 Vitamin D deficiency 07/20/2010   Past Surgical History:  Procedure Laterality Date  . COLONOSCOPY    . fibroid sx    . right lateral epicondylar    . TRIGGER FINGER RELEASE  2016  . VAGINAL HYSTERECTOMY    . WRIST FRACTURE SURGERY Left 05/25/2014   Social History   Socioeconomic History  . Marital status: Married    Spouse name: None  . Number of children: None  . Years of education: None  . Highest education level: None  Social Needs  . Financial resource strain: None  . Food insecurity - worry: None  . Food insecurity - inability: None  . Transportation needs - medical: None  . Transportation needs - non-medical: None  Occupational History  . Occupation: cna  Tobacco Use  . Smoking status: Former Research scientist (life sciences)  . Smokeless tobacco: Never Used  . Tobacco comment: quit 30 years ago  Substance and Sexual Activity  . Alcohol use: No  . Drug use: No  . Sexual activity:  None  Other Topics Concern  . 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         Allergies  Allergen Reactions  . Sulfa Antibiotics Other (See Comments)  . Sulfonamide Derivatives Other (See Comments)    Pt unknown reaction    Family History  Problem Relation Age of Onset  . Alcohol abuse Mother   . Alcohol abuse Father   . Heart attack Father        died suddenly at home with presumed MI  . Colon cancer Neg Hx      Past medical history, social, surgical and family history all reviewed in electronic medical record.  No pertanent information unless stated regarding to the chief complaint.   Review of Systems:Review of systems updated and as accurate as of 04/22/17  No headache, visual changes, nausea, vomiting, diarrhea, constipation, dizziness, abdominal pain, skin rash, fevers, chills, night sweats, weight loss, swollen lymph nodes, body aches, joint swelling, chest pain, shortness of breath, mood changes.  Positive muscle aches  Objective  Blood pressure 110/74, pulse 68, height 5\' 8"  (1.727 m), weight 132 lb (59.9 kg), SpO2 98 %. Systems examined below as of 04/22/17  General: No apparent distress alert and oriented x3 mood and affect normal, dressed appropriately.  HEENT: Pupils equal, extraocular movements intact  Respiratory: Patient's speak in full sentences and does not appear short of breath  Cardiovascular: No lower extremity edema, non tender, no erythema  Skin: Warm dry intact with no signs of infection or rash on extremities or on axial skeleton.  Abdomen: Soft nontender  Neuro: Cranial nerves II through XII are intact, neurovascularly intact in all extremities with 2+ DTRs and 2+ pulses.  Lymph: No lymphadenopathy of posterior or anterior cervical chain or axillae bilaterally.  Gait normal with good balance and coordination.  MSK:  Non tender with  full range of motion and good stability and symmetric strength and tone of shoulders, elbows, wrist, hip, knee and ankles bilaterally.  Back Exam:  Inspection: Mild loss of lordosis Motion: Flexion 40 deg, Extension 45 deg, Side Bending to 45 deg bilaterally,  Rotation to 45 deg bilaterally  SLR laying: Negative  XSLR laying: Negative  Palpable tenderness: Severe tenderness over the right greater trochanteric area.Marland Kitchen FABER: Positive right. Sensory change: Gross sensation intact to all lumbar and sacral dermatomes.  Reflexes: 2+ at both patellar tendons, 2+ at achilles tendons, Babinski's downgoing.  Strength at foot  Plantar-flexion: 5/5 Dorsi-flexion: 5/5 Eversion: 5/5 Inversion: 5/5  Leg strength  Quad: 5/5 Hamstring: 5/5 Hip flexor: 5/5 Hip abductors: 5/5  Gait unremarkable.   Procedure: Real-time Ultrasound Guided Injection of right greater trochanteric bursitis secondary to patient's body habitus Device: GE Logiq Q7 Ultrasound guided injection is preferred based studies that show increased duration, increased effect, greater accuracy, decreased procedural pain, increased response rate, and decreased cost with ultrasound guided versus blind injection.  Verbal informed consent obtained.  Time-out conducted.  Noted no overlying erythema, induration, or other signs of local infection.  Skin prepped in a sterile fashion.  Local anesthesia: Topical Ethyl chloride.  With sterile technique and under real time ultrasound guidance:  Greater trochanteric area was visualized and patient's bursa was noted. A 22-gauge 3 inch needle was inserted and 4 cc of 0.5% Marcaine and 1 cc of Kenalog 40 mg/dL was injected. Pictures taken Completed without difficulty  Pain immediately resolved suggesting accurate placement of the medication.  Advised to call if fevers/chills, erythema, induration, drainage, or persistent bleeding.  Images permanently stored and available for review in the ultrasound unit.    Impression: Technically successful ultrasound guided injection.     Impression and Recommendations:     This case required medical decision making of moderate complexity.      Note: This dictation was prepared with Dragon dictation along with smaller phrase technology. Any transcriptional errors that result from this process are unintentional.

## 2017-04-22 ENCOUNTER — Encounter: Payer: Self-pay | Admitting: Family Medicine

## 2017-04-22 ENCOUNTER — Ambulatory Visit: Payer: Self-pay

## 2017-04-22 ENCOUNTER — Inpatient Hospital Stay: Payer: 59 | Admitting: Internal Medicine

## 2017-04-22 ENCOUNTER — Ambulatory Visit (INDEPENDENT_AMBULATORY_CARE_PROVIDER_SITE_OTHER): Payer: 59 | Admitting: Family Medicine

## 2017-04-22 VITALS — BP 110/74 | HR 68 | Ht 68.0 in | Wt 132.0 lb

## 2017-04-22 DIAGNOSIS — M7061 Trochanteric bursitis, right hip: Secondary | ICD-10-CM | POA: Diagnosis not present

## 2017-04-22 DIAGNOSIS — M25551 Pain in right hip: Secondary | ICD-10-CM

## 2017-04-22 MED ORDER — VITAMIN D (ERGOCALCIFEROL) 1.25 MG (50000 UNIT) PO CAPS: ORAL_CAPSULE | ORAL | 0 refills | Status: DC

## 2017-04-22 NOTE — Patient Instructions (Signed)
Good to see you  Ice is your friend Injected the hip  Stay active Fish oil 1 gram daily  Once weekly vitamin D again  No fatty foods at all and stay hydrated  See me when you need me

## 2017-04-22 NOTE — Assessment & Plan Note (Signed)
Patient was given injection a month ago.  Continued to have trouble.  I do think that there is some lumbar radiculopathy.  Patient is not taking gabapentin regularly.  Discussed icing regimen, discussed home exercises.  Declined physical therapy.  Encourage patient to continue to stay active.  Started once weekly vitamin D for muscle strength and endurance.  Follow-up again in 4-6 weeks

## 2017-04-25 ENCOUNTER — Ambulatory Visit: Payer: 59 | Admitting: Internal Medicine

## 2017-04-25 ENCOUNTER — Encounter: Payer: Self-pay | Admitting: Internal Medicine

## 2017-04-25 VITALS — BP 114/72 | HR 72 | Temp 98.6°F | Ht 68.0 in | Wt 132.0 lb

## 2017-04-25 DIAGNOSIS — F411 Generalized anxiety disorder: Secondary | ICD-10-CM

## 2017-04-25 DIAGNOSIS — R1011 Right upper quadrant pain: Secondary | ICD-10-CM

## 2017-04-25 DIAGNOSIS — F329 Major depressive disorder, single episode, unspecified: Secondary | ICD-10-CM

## 2017-04-25 DIAGNOSIS — F32A Depression, unspecified: Secondary | ICD-10-CM

## 2017-04-25 NOTE — Progress Notes (Signed)
Subjective:    Patient ID: [name], female    DOB: 1960-12-15, 57 y.o.   MRN: [id number]  HPI  Ere to f/u recent ED visit with RUQ pain onset about 10d ago with onset right sided stabbing pain , severe, constant but did seem to wax and wane somewhat, assoc with nausea but no help with antacid OTC.  Labs and limited RUQ u/s neg for acute gallbladder.  No fever or diarrhea. Has Hx of IBS but seems different.  Denies worsening reflux, dysphagia, n/v, bowel change or blood. Pt denies increased sob or doe, wheezing, orthopnea, PND, increased LE swelling, palpitations, dizziness or syncope but does have some pleuritic compoent to the pain and tenderness in the right costal margin.  Denies worsening depressive symptoms, suicidal ideation, or panic; has ongoing anxiety Past Medical History:  Diagnosis Date  . Allergic rhinitis   . Anemia    history of anemia secondary to fibroid sx  . Anxiety and depression   . Cervical disc disease 07/20/2010  . Cyst of ovary    bilateral  . Foot fracture, left    hx of  . Hyperlipidemia   . Hypothyroidism   . IBS (irritable bowel syndrome)   . Iron deficiency 07/20/2010  . Low back pain   . Lumbar disc disease 07/20/2010  . Osteoporosis 07/20/2010  . Post-operative nausea and vomiting   . 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 Vitamin D deficiency 07/20/2010   Past Surgical History:  Procedure Laterality Date  . COLONOSCOPY    . fibroid sx    . right lateral epicondylar    . TRIGGER FINGER RELEASE  2016  . VAGINAL HYSTERECTOMY    . WRIST FRACTURE SURGERY Left 05/25/2014    reports that she has quit smoking. She has never used smokeless tobacco. She reports that she does not drink alcohol or use drugs. family history includes Alcohol abuse in her father and mother; Heart attack in her father. Allergies  Allergen Reactions  . Sulfa Antibiotics Other (See Comments)  . Sulfonamide Derivatives Other (See Comments)    Pt unknown reaction    Current  Outpatient Medications on File Prior to Visit  Medication Sig Dispense Refill  . acetaminophen (TYLENOL) 500 MG tablet Take 1,000 mg by mouth every 6 (six) hours as needed for mild pain, moderate pain or headache.    Marland Kitchen aspirin EC 81 MG tablet Take 1 tablet (81 mg total) by mouth daily. 90 tablet   . Biotin 1 MG CAPS Take 1 capsule by mouth daily.    Marland Kitchen denosumab (PROLIA) 60 MG/ML SOLN injection Inject 1 Dose into the skin every 6 (six) months.    . Diclofenac Sodium 2 % SOLN Apply twice daily. (Patient taking differently: Apply 1 application topically 2 (two) times daily as needed (pain). ) 112 g 1  . docusate sodium (COLACE) 100 MG capsule Take 200 mg by mouth daily.    Marland Kitchen eletriptan (RELPAX) 40 MG tablet Take 1 tablet (40 mg total) by mouth daily as needed for migraine or headache. 10 tablet 5  . gabapentin (NEURONTIN) 100 MG capsule Take 2 capsules (200 mg total) by mouth at bedtime. 60 capsule 3  . HYDROcodone-acetaminophen (NORCO/VICODIN) 5-325 MG tablet Take 1 tablet by mouth every 4 (four) hours as needed. 10 tablet 0  . levothyroxine (SYNTHROID) 50 MCG tablet Take 1 tablet (50 mcg total) by mouth every other day. 45 tablet 3  . levothyroxine (SYNTHROID) 75 MCG tablet Take 1 tablet (75  mcg total) by mouth every other day. 45 tablet 3  . meloxicam (MOBIC) 15 MG tablet TAKE 1 TABLET EACH DAY. (Patient taking differently: Take 15 mg by mouth daily as needed for pain. ) 90 tablet 3  . nadolol (CORGARD) 20 MG tablet 1/2 tab by mouth twice per day as needed (Patient taking differently: 10 mg 2 (two) times daily as needed (heartrate). ) 90 tablet 3  . naproxen sodium (ALEVE) 220 MG tablet Take 220-440 mg by mouth 2 (two) times daily as needed (PAIN).    Marland Kitchen ondansetron (ZOFRAN ODT) 4 MG disintegrating tablet Take 1 tablet (4 mg total) by mouth every 8 (eight) hours as needed for nausea. 10 tablet 0  . predniSONE (DELTASONE) 50 MG tablet Take 1 tablet (50 mg total) by mouth daily. 5 tablet 0  .  triamcinolone cream (KENALOG) 0.1 % Apply 1 application topically 2 (two) times daily. 30 g 0  . Vitamin D, Ergocalciferol, (DRISDOL) 50000 units CAPS capsule TAKE ONE CAPSULE ONCE A WEEK. 12 capsule 0  . zolpidem (AMBIEN CR) 6.25 MG CR tablet Take 1 tablet (6.25 mg total) by mouth at bedtime as needed. 90 tablet 1   No current facility-administered medications on file prior to visit.    Current Outpatient Medications on File Prior to Visit  Medication Sig Dispense Refill  . acetaminophen (TYLENOL) 500 MG tablet Take 1,000 mg by mouth every 6 (six) hours as needed for mild pain, moderate pain or headache.    Marland Kitchen aspirin EC 81 MG tablet Take 1 tablet (81 mg total) by mouth daily. 90 tablet   . Biotin 1 MG CAPS Take 1 capsule by mouth daily.    Marland Kitchen denosumab (PROLIA) 60 MG/ML SOLN injection Inject 1 Dose into the skin every 6 (six) months.    . Diclofenac Sodium 2 % SOLN Apply twice daily. (Patient taking differently: Apply 1 application topically 2 (two) times daily as needed (pain). ) 112 g 1  . docusate sodium (COLACE) 100 MG capsule Take 200 mg by mouth daily.    Marland Kitchen eletriptan (RELPAX) 40 MG tablet Take 1 tablet (40 mg total) by mouth daily as needed for migraine or headache. 10 tablet 5  . gabapentin (NEURONTIN) 100 MG capsule Take 2 capsules (200 mg total) by mouth at bedtime. 60 capsule 3  . HYDROcodone-acetaminophen (NORCO/VICODIN) 5-325 MG tablet Take 1 tablet by mouth every 4 (four) hours as needed. 10 tablet 0  . levothyroxine (SYNTHROID) 50 MCG tablet Take 1 tablet (50 mcg total) by mouth every other day. 45 tablet 3  . levothyroxine (SYNTHROID) 75 MCG tablet Take 1 tablet (75 mcg total) by mouth every other day. 45 tablet 3  . meloxicam (MOBIC) 15 MG tablet TAKE 1 TABLET EACH DAY. (Patient taking differently: Take 15 mg by mouth daily as needed for pain. ) 90 tablet 3  . nadolol (CORGARD) 20 MG tablet 1/2 tab by mouth twice per day as needed (Patient taking differently: 10 mg 2 (two) times  daily as needed (heartrate). ) 90 tablet 3  . naproxen sodium (ALEVE) 220 MG tablet Take 220-440 mg by mouth 2 (two) times daily as needed (PAIN).    Marland Kitchen ondansetron (ZOFRAN ODT) 4 MG disintegrating tablet Take 1 tablet (4 mg total) by mouth every 8 (eight) hours as needed for nausea. 10 tablet 0  . predniSONE (DELTASONE) 50 MG tablet Take 1 tablet (50 mg total) by mouth daily. 5 tablet 0  . triamcinolone cream (KENALOG) 0.1 % Apply  1 application topically 2 (two) times daily. 30 g 0  . Vitamin D, Ergocalciferol, (DRISDOL) 50000 units CAPS capsule TAKE ONE CAPSULE ONCE A WEEK. 12 capsule 0  . zolpidem (AMBIEN CR) 6.25 MG CR tablet Take 1 tablet (6.25 mg total) by mouth at bedtime as needed. 90 tablet 1   No current facility-administered medications on file prior to visit.    Review of Systems  Constitutional: Negative for other unusual diaphoresis or sweats HENT: Negative for ear discharge or swelling Eyes: Negative for other worsening visual disturbances Respiratory: Negative for stridor or other swelling  Gastrointestinal: Negative for worsening distension or other blood Genitourinary: Negative for retention or other urinary change Musculoskeletal: Negative for other MSK pain or swelling Skin: Negative for color change or other new lesions Neurological: Negative for worsening tremors and other numbness  Psychiatric/Behavioral: Negative for worsening agitation or other fatigue All other system neg per pt    Objective:   Physical Exam BP 114/72   Pulse 72   Temp 98.6 F (37 C) (Oral)   Ht 5\' 8"  (1.727 m)   Wt 132 lb (59.9 kg)   SpO2 99%   BMI 20.07 kg/m  VS noted,  Constitutional: Pt appears in NAD HENT: Head: NCAT.  Right Ear: External ear normal.  Left Ear: External ear normal.  Eyes: . Pupils are equal, round, and reactive to light. Conjunctivae and EOM are normal Nose: without d/c or deformity Neck: Neck supple. Gross normal ROM Cardiovascular: Normal rate and regular  rhythm.   Pulmonary/Chest: Effort normal and breath sounds without rales or wheezing.  Abd:  Soft, NT, ND, + BS, no organomegaly except for tender to right costal margin without rash Neurological: Pt is alert. At baseline orientation, motor grossly intact Skin: Skin is warm. No rashes, other new lesions, no LE edema Psychiatric: Pt behavior is normal without agitation , mild nervous All other system neg per pt Lab Results  Component Value Date   WBC 4.5 04/13/2017   HGB 11.9 (L) 04/13/2017   HCT 35.5 (L) 04/13/2017   PLT 169 04/13/2017   GLUCOSE 117 (H) 04/13/2017   CHOL 214 (H) 10/15/2016   TRIG 47.0 10/15/2016   HDL 88.10 10/15/2016   LDLDIRECT 95.5 07/20/2010   LDLCALC 116 (H) 10/15/2016   ALT 14 04/13/2017   AST 21 04/13/2017   NA 138 04/13/2017   K 3.9 04/13/2017   CL 104 04/13/2017   CREATININE 0.61 04/13/2017   BUN 22 (H) 04/13/2017   CO2 26 04/13/2017   TSH 4.00 10/15/2016      Assessment & Plan:

## 2017-04-25 NOTE — Patient Instructions (Signed)
Your exam today is good  The possibilities for your pain include costochondritis, IBS, kidney stone, or even acute pancreatitis  Please follow up for any further pain as you may need a CT scan, and call if you would want a referral to Gastroenterology - Dr Carlean Purl  Please continue all other medications as before, and refills have been done if requested.  Please have the pharmacy call with any other refills you may need.  Please keep your appointments with your specialists as you may have planned

## 2017-04-27 ENCOUNTER — Encounter: Payer: Self-pay | Admitting: Internal Medicine

## 2017-04-27 NOTE — Assessment & Plan Note (Signed)
stable overall by history and exam, and pt to continue medical treatment as before,  to f/u any worsening symptoms or concerns 

## 2017-04-27 NOTE — Assessment & Plan Note (Signed)
midl situational worse, cont to follow,  to f/u any worsening symptoms or concerns

## 2017-04-27 NOTE — Assessment & Plan Note (Signed)
Etiology unclear, recent limited u/s neg for acute gallbladder, suspect MSK pain, but cant completely r/o other such as renal stone or pancreas related, d/w pt and as she is some improved we will hold on further imaging or GI referral for now,  to f/u any worsening symptoms or concerns

## 2017-04-29 ENCOUNTER — Other Ambulatory Visit: Payer: Self-pay | Admitting: Internal Medicine

## 2017-04-30 NOTE — Telephone Encounter (Signed)
Done erx 

## 2017-04-30 NOTE — Telephone Encounter (Signed)
Last filled 03/11/2017

## 2017-05-06 ENCOUNTER — Ambulatory Visit (INDEPENDENT_AMBULATORY_CARE_PROVIDER_SITE_OTHER): Payer: 59

## 2017-05-06 ENCOUNTER — Telehealth: Payer: Self-pay

## 2017-05-06 DIAGNOSIS — M810 Age-related osteoporosis without current pathological fracture: Secondary | ICD-10-CM | POA: Diagnosis not present

## 2017-05-06 MED ORDER — DENOSUMAB 60 MG/ML ~~LOC~~ SOLN
60.0000 mg | Freq: Once | SUBCUTANEOUS | Status: AC
  Administered 2017-05-06: 60 mg via SUBCUTANEOUS

## 2017-05-06 NOTE — Telephone Encounter (Signed)
Copied from [place]. Topic: Appointment Scheduling - Scheduling Inquiry for Clinic >> May 06, 2017  9:08 AM Synthia Innocent wrote: Reason for [id number] office called to scheduled prolia injection, I do not see CRM or telephone note. Please advise  Patient is due prolia, telephone note not created in error---patient has scheduled nurse visit, estimated $0 copay, 2019 insurance verified

## 2017-05-06 NOTE — Progress Notes (Signed)
d 

## 2017-05-17 DIAGNOSIS — H9041 Sensorineural hearing loss, unilateral, right ear, with unrestricted hearing on the contralateral side: Secondary | ICD-10-CM | POA: Diagnosis not present

## 2017-05-17 DIAGNOSIS — H6062 Unspecified chronic otitis externa, left ear: Secondary | ICD-10-CM | POA: Diagnosis not present

## 2017-05-17 DIAGNOSIS — H6122 Impacted cerumen, left ear: Secondary | ICD-10-CM | POA: Diagnosis not present

## 2017-07-01 ENCOUNTER — Other Ambulatory Visit: Payer: Self-pay | Admitting: Internal Medicine

## 2017-07-03 DIAGNOSIS — J301 Allergic rhinitis due to pollen: Secondary | ICD-10-CM | POA: Diagnosis not present

## 2017-07-03 DIAGNOSIS — J3089 Other allergic rhinitis: Secondary | ICD-10-CM | POA: Diagnosis not present

## 2017-07-03 DIAGNOSIS — R0683 Snoring: Secondary | ICD-10-CM | POA: Diagnosis not present

## 2017-08-12 DIAGNOSIS — N760 Acute vaginitis: Secondary | ICD-10-CM | POA: Diagnosis not present

## 2017-08-23 DIAGNOSIS — H04123 Dry eye syndrome of bilateral lacrimal glands: Secondary | ICD-10-CM | POA: Diagnosis not present

## 2017-09-14 ENCOUNTER — Other Ambulatory Visit: Payer: Self-pay | Admitting: Family Medicine

## 2017-10-16 ENCOUNTER — Other Ambulatory Visit (INDEPENDENT_AMBULATORY_CARE_PROVIDER_SITE_OTHER): Payer: 59

## 2017-10-16 DIAGNOSIS — Z0000 Encounter for general adult medical examination without abnormal findings: Secondary | ICD-10-CM | POA: Diagnosis not present

## 2017-10-16 LAB — BASIC METABOLIC PANEL
BUN: 31 mg/dL — AB (ref 6–23)
CHLORIDE: 106 meq/L (ref 96–112)
CO2: 29 meq/L (ref 19–32)
CREATININE: 0.72 mg/dL (ref 0.40–1.20)
Calcium: 9.1 mg/dL (ref 8.4–10.5)
GFR: 88.43 mL/min (ref >60.00)
GLUCOSE: 90 mg/dL (ref 70–99)
POTASSIUM: 4.1 meq/L (ref 3.5–5.1)
Sodium: 141 mEq/L (ref 135–145)

## 2017-10-16 LAB — CBC WITH DIFFERENTIAL/PLATELET
Basophils Absolute: 0 10*3/uL (ref 0.0–0.1)
Basophils Relative: 0.9 % (ref 0.0–3.0)
Eosinophils Absolute: 0.2 10*3/uL (ref 0.0–0.7)
Eosinophils Relative: 4.5 % (ref 0.0–5.0)
HCT: 37.5 % (ref 36.0–46.0)
Hemoglobin: 12.8 g/dL (ref 12.0–15.0)
Lymphocytes Relative: 41.9 % (ref 12.0–46.0)
Lymphs Abs: 2 10*3/uL (ref 0.7–4.0)
MCHC: 34.3 g/dL (ref 30.0–36.0)
MCV: 90 fl (ref 78.0–100.0)
Monocytes Absolute: 0.5 10*3/uL (ref 0.1–1.0)
Monocytes Relative: 10.6 % (ref 3.0–12.0)
Neutro Abs: 2 10*3/uL (ref 1.4–7.7)
Neutrophils Relative %: 42.1 % — ABNORMAL LOW (ref 43.0–77.0)
Platelets: 172 10*3/uL (ref 150.0–400.0)
RBC: 4.17 Mil/uL (ref 3.87–5.11)
RDW: 13.1 % (ref 11.5–15.5)
WBC: 4.7 10*3/uL (ref 4.0–10.5)

## 2017-10-16 LAB — HEPATIC FUNCTION PANEL
ALT: 10 U/L (ref 0–35)
AST: 15 U/L (ref 0–37)
Albumin: 4.2 g/dL (ref 3.5–5.2)
Alkaline Phosphatase: 35 U/L — ABNORMAL LOW (ref 39–117)
Bilirubin, Direct: 0.1 mg/dL (ref 0.0–0.3)
Total Bilirubin: 0.5 mg/dL (ref 0.2–1.2)
Total Protein: 6.8 g/dL (ref 6.0–8.3)

## 2017-10-16 LAB — URINALYSIS, ROUTINE W REFLEX MICROSCOPIC
Bilirubin Urine: NEGATIVE
Hgb urine dipstick: NEGATIVE
Ketones, ur: NEGATIVE
Leukocytes, UA: NEGATIVE
Nitrite: NEGATIVE
Specific Gravity, Urine: 1.025 (ref 1.000–1.030)
Total Protein, Urine: NEGATIVE
Urine Glucose: NEGATIVE
Urobilinogen, UA: 0.2 (ref 0.0–1.0)
pH: 5.5 (ref 5.0–8.0)

## 2017-10-16 LAB — LIPID PANEL
CHOL/HDL RATIO: 2
CHOLESTEROL: 187 mg/dL (ref 0–200)
HDL: 88 mg/dL (ref >39.00)
LDL CALC: 91 mg/dL (ref 0–99)
NONHDL: 98.93
Triglycerides: 40 mg/dL (ref 0.0–149.0)
VLDL: 8 mg/dL (ref 0.0–40.0)

## 2017-10-16 LAB — TSH: TSH: 6.14 u[IU]/mL — AB (ref 0.35–4.50)

## 2017-10-21 ENCOUNTER — Encounter: Payer: Self-pay | Admitting: Internal Medicine

## 2017-10-21 ENCOUNTER — Ambulatory Visit (INDEPENDENT_AMBULATORY_CARE_PROVIDER_SITE_OTHER): Payer: 59 | Admitting: Internal Medicine

## 2017-10-21 VITALS — BP 112/72 | HR 70 | Temp 98.2°F | Ht 68.0 in | Wt 136.0 lb

## 2017-10-21 DIAGNOSIS — E039 Hypothyroidism, unspecified: Secondary | ICD-10-CM | POA: Diagnosis not present

## 2017-10-21 DIAGNOSIS — M810 Age-related osteoporosis without current pathological fracture: Secondary | ICD-10-CM | POA: Diagnosis not present

## 2017-10-21 DIAGNOSIS — Z23 Encounter for immunization: Secondary | ICD-10-CM | POA: Diagnosis not present

## 2017-10-21 DIAGNOSIS — Z0000 Encounter for general adult medical examination without abnormal findings: Secondary | ICD-10-CM

## 2017-10-21 DIAGNOSIS — Z114 Encounter for screening for human immunodeficiency virus [HIV]: Secondary | ICD-10-CM

## 2017-10-21 MED ORDER — LEVOTHYROXINE SODIUM 75 MCG PO TABS: 75.0000 ug | ORAL_TABLET | ORAL | 3 refills | Status: DC

## 2017-10-21 NOTE — Assessment & Plan Note (Signed)
Mild uncontrolled, for increased levothyuroxin to 75 qd, f/u lab in 4 wks

## 2017-10-21 NOTE — Assessment & Plan Note (Signed)
Plans to f/u with GYN for f/u testing

## 2017-10-21 NOTE — Patient Instructions (Addendum)
You had the flu shot today  OK to increase the levothyroxine to 75 mcg per day  Please go to the LAB only in the Basement (turn left off the elevator) for the tests to be done in 4 weeks  Please continue all other medications as before, and refills have been done if requested.  Please have the pharmacy call with any other refills you may need.  Please continue your efforts at being more active, low cholesterol diet, and weight control.  You are otherwise up to date with prevention measures today.  Please keep your appointments with your specialists as you may have planned - GYN with bone density testing  Please return in 1 year for your yearly visit, or sooner if needed, with Lab testing done 3-5 days before

## 2017-10-21 NOTE — Progress Notes (Signed)
Subjective:    Patient ID: [name], female    DOB: Feb 15, 1960, 57 y.o.   MRN: [id number]  HPI  Here for wellness and f/u;  Overall doing ok;  Pt denies Chest pain, worsening SOB, DOE, wheezing, orthopnea, PND, worsening LE edema, palpitations, dizziness or syncope.  Pt denies neurological change such as new headache, facial or extremity weakness.  Pt denies polydipsia, polyuria, or low sugar symptoms. Pt states overall good compliance with treatment and medications, good tolerability, and has been trying to follow appropriate diet.  Pt denies worsening depressive symptoms, suicidal ideation or panic. No fever, night sweats, wt loss, loss of appetite, or other constitutional symptoms.  Pt states good ability with ADL's, has low fall risk, home safety reviewed and adequate, no other significant changes in hearing or vision, and only occasionally active with exercise. Wt Readings from Last 3 Encounters:  10/21/17 136 lb (61.7 kg)  04/25/17 132 lb (59.9 kg)  04/22/17 132 lb (59.9 kg)   Past Medical History:  Diagnosis Date  . Allergic rhinitis   . Anemia    history of anemia secondary to fibroid sx  . Anxiety and depression   . Cervical disc disease 07/20/2010  . Cyst of ovary    bilateral  . Foot fracture, left    hx of  . Hyperlipidemia   . Hypothyroidism   . IBS (irritable bowel syndrome)   . Iron deficiency 07/20/2010  . Low back pain   . Lumbar disc disease 07/20/2010  . Osteoporosis 07/20/2010  . Post-operative nausea and vomiting   . 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 Vitamin D deficiency 07/20/2010   Past Surgical History:  Procedure Laterality Date  . COLONOSCOPY    . fibroid sx    . right lateral epicondylar    . TRIGGER FINGER RELEASE  2016  . VAGINAL HYSTERECTOMY    . WRIST FRACTURE SURGERY Left 05/25/2014    reports that she has quit smoking. She has never used smokeless tobacco. She reports that she does not drink alcohol or use drugs. family history includes  Alcohol abuse in her father and mother; Heart attack in her father. Allergies  Allergen Reactions  . Sulfa Antibiotics Other (See Comments)  . Sulfonamide Derivatives Other (See Comments)    Pt unknown reaction    Current Outpatient Medications on File Prior to Visit  Medication Sig Dispense Refill  . acetaminophen (TYLENOL) 500 MG tablet Take 1,000 mg by mouth every 6 (six) hours as needed for mild pain, moderate pain or headache.    Marland Kitchen aspirin EC 81 MG tablet Take 1 tablet (81 mg total) by mouth daily. 90 tablet   . Biotin 1 MG CAPS Take 1 capsule by mouth daily.    Marland Kitchen denosumab (PROLIA) 60 MG/ML SOLN injection Inject 1 Dose into the skin every 6 (six) months.    . Diclofenac Sodium 2 % SOLN Apply twice daily. (Patient taking differently: Apply 1 application topically 2 (two) times daily as needed (pain). ) 112 g 1  . docusate sodium (COLACE) 100 MG capsule Take 200 mg by mouth daily.    Marland Kitchen eletriptan (RELPAX) 40 MG tablet Take 1 tablet (40 mg total) by mouth daily as needed for migraine or headache. 10 tablet 5  . gabapentin (NEURONTIN) 100 MG capsule Take 2 capsules (200 mg total) by mouth at bedtime. 60 capsule 3  . HYDROcodone-acetaminophen (NORCO/VICODIN) 5-325 MG tablet Take 1 tablet by mouth every 4 (four) hours as needed. 10 tablet 0  .  meloxicam (MOBIC) 15 MG tablet TAKE 1 TABLET EACH DAY. (Patient taking differently: Take 15 mg by mouth daily as needed for pain. ) 90 tablet 3  . nadolol (CORGARD) 20 MG tablet 1/2 tab by mouth twice per day as needed (Patient taking differently: 10 mg 2 (two) times daily as needed (heartrate). ) 90 tablet 3  . naproxen sodium (ALEVE) 220 MG tablet Take 220-440 mg by mouth 2 (two) times daily as needed (PAIN).    Marland Kitchen ondansetron (ZOFRAN ODT) 4 MG disintegrating tablet Take 1 tablet (4 mg total) by mouth every 8 (eight) hours as needed for nausea. 10 tablet 0  . predniSONE (DELTASONE) 50 MG tablet Take 1 tablet (50 mg total) by mouth daily. 5 tablet 0  .  Vitamin D, Ergocalciferol, (DRISDOL) 50000 units CAPS capsule TAKE ONE CAPSULE ONCE A WEEK. 4 capsule 0  . zolpidem (AMBIEN CR) 6.25 MG CR tablet TAKE 1 TABLET AT BEDTIME AS NEEDED. 90 tablet 1   No current facility-administered medications on file prior to visit.    Review of Systems  Constitutional: Negative for other unusual diaphoresis or sweats HENT: Negative for ear discharge or swelling Eyes: Negative for other worsening visual disturbances Respiratory: Negative for stridor or other swelling  Gastrointestinal: Negative for worsening distension or other blood Genitourinary: Negative for retention or other urinary change Musculoskeletal: Negative for other MSK pain or swelling Skin: Negative for color change or other new lesions Neurological: Negative for worsening tremors and other numbness  Psychiatric/Behavioral: Negative for worsening agitation or other fatigue All other system neg per pt    Objective:   Physical Exam BP 112/72   Pulse 70   Temp 98.2 F (36.8 C) (Oral)   Ht 5\' 8"  (1.727 m)   Wt 136 lb (61.7 kg)   SpO2 99%   BMI 20.68 kg/m  VS noted,  Constitutional: Pt is oriented to person, place, and time. Appears well-developed and well-nourished, in no significant distress and comfortable Head: Normocephalic and atraumatic  Eyes: Conjunctivae and EOM are normal. Pupils are equal, round, and reactive to light Right Ear: External ear normal without discharge Left Ear: External ear normal without discharge Nose: Nose without discharge or deformity Mouth/Throat: Oropharynx is without other ulcerations and moist  Neck: Normal range of motion. Neck supple. No JVD present. No tracheal deviation present or significant neck LA or mass Cardiovascular: Normal rate, regular rhythm, normal heart sounds and intact distal pulses.   Pulmonary/Chest: WOB normal and breath sounds without rales or wheezing  Abdominal: Soft. Bowel sounds are normal. NT. No HSM  Musculoskeletal:  Normal range of motion. Exhibits no edema Lymphadenopathy: Has no other cervical adenopathy.  Neurological: Pt is alert and oriented to person, place, and time. Pt has normal reflexes. No cranial nerve deficit. Motor grossly intact, Gait intact Skin: Skin is warm and dry. No rash noted or new ulcerations Psychiatric:  Has normal mood and affect. Behavior is normal without agitation, mild nervous Lab Results  Component Value Date   WBC 4.7 10/16/2017   HGB 12.8 10/16/2017   HCT 37.5 10/16/2017   PLT 172.0 10/16/2017   GLUCOSE 90 10/16/2017   CHOL 187 10/16/2017   TRIG 40.0 10/16/2017   HDL 88.00 10/16/2017   LDLDIRECT 95.5 07/20/2010   LDLCALC 91 10/16/2017   ALT 10 10/16/2017   AST 15 10/16/2017   NA 141 10/16/2017   K 4.1 10/16/2017   CL 106 10/16/2017   CREATININE 0.72 10/16/2017   BUN 31 (H) 10/16/2017  CO2 29 10/16/2017   TSH 6.14 (H) 10/16/2017          Assessment & Plan:

## 2017-10-21 NOTE — Assessment & Plan Note (Signed)

## 2017-10-28 ENCOUNTER — Other Ambulatory Visit: Payer: Self-pay | Admitting: Family Medicine

## 2017-10-28 NOTE — Telephone Encounter (Signed)
Refill done.  

## 2017-10-29 ENCOUNTER — Telehealth: Payer: Self-pay | Admitting: Internal Medicine

## 2017-10-29 NOTE — Telephone Encounter (Signed)
Copied from [place] [id number]. Topic: Quick Communication - See Telephone Encounter >> Oct 29, 2017  3:08 PM Bea Graff, NT wrote: CRM for notification. See Telephone encounter for: 10/29/17. Pt states she was seen on 10/21/17 and was told her levothyroxine was to be increased to 75 mg and to take everyday. The rx instructions state to take every other day. Pt needs clarification on how to take the medication, Requesting a call today if possible.

## 2017-10-29 NOTE — Telephone Encounter (Signed)
MD's noted says increasse levothyrozine to 75mg  qd.  Please advise and call back

## 2017-10-30 MED ORDER — LEVOTHYROXINE SODIUM 75 MCG PO TABS: 75.0000 ug | ORAL_TABLET | Freq: Every day | ORAL | 3 refills | Status: DC

## 2017-10-30 NOTE — Telephone Encounter (Signed)
Pt informed to take levothyroxine  75 qd per 10/24/17 OV note. New Rx sent. See meds.

## 2017-11-01 ENCOUNTER — Other Ambulatory Visit: Payer: Self-pay | Admitting: Internal Medicine

## 2017-11-06 ENCOUNTER — Ambulatory Visit (INDEPENDENT_AMBULATORY_CARE_PROVIDER_SITE_OTHER): Payer: 59 | Admitting: Emergency Medicine

## 2017-11-06 ENCOUNTER — Other Ambulatory Visit: Payer: Self-pay | Admitting: *Deleted

## 2017-11-06 ENCOUNTER — Ambulatory Visit (INDEPENDENT_AMBULATORY_CARE_PROVIDER_SITE_OTHER)
Admission: RE | Admit: 2017-11-06 | Discharge: 2017-11-06 | Disposition: A | Discharge status: Home / Self Care | Payer: 59 | Source: Ambulatory Visit | Attending: Family Medicine | Admitting: Family Medicine

## 2017-11-06 DIAGNOSIS — M79672 Pain in left foot: Secondary | ICD-10-CM

## 2017-11-06 DIAGNOSIS — M810 Age-related osteoporosis without current pathological fracture: Secondary | ICD-10-CM

## 2017-11-06 DIAGNOSIS — S9032XA Contusion of left foot, initial encounter: Secondary | ICD-10-CM | POA: Diagnosis not present

## 2017-11-06 MED ORDER — DENOSUMAB 60 MG/ML ~~LOC~~ SOSY
60.0000 mg | PREFILLED_SYRINGE | Freq: Once | SUBCUTANEOUS | Status: AC
  Administered 2017-11-06: 60 mg via SUBCUTANEOUS

## 2017-11-06 NOTE — Progress Notes (Signed)
Medical screening examination/treatment/procedure(s) were performed by non-physician practitioner and as supervising physician I was immediately available for consultation/collaboration. I agree with above. James John, MD   

## 2017-11-11 NOTE — Progress Notes (Signed)
[name] [place], [place] [address] Phone: [phone] Subjective:    I [name] am serving as a Education administrator for Dr. Hulan Saas.  CC: foot injury   JJH:[id number]  [name] is a 57 y.o. female coming in with complaint of left foot pain. Fell in [place]. Ankle is sore. States she wants an injection. Hasn't be able to walk as much. Also wants to check the left wrist.   Onset- 2 weeks ago Dorsal aspect of the foot was swollen and bruised.  Has had difficulty now actually little walking on it for the last 2 weeks.  States that the swelling is improved a little bit.  Unable to move the ankle though like she would like to.  Intermittent numbing of the toes as well     Past Medical History:  Diagnosis Date  . Allergic rhinitis   . Anemia    history of anemia secondary to fibroid sx  . Anxiety and depression   . Cervical disc disease 07/20/2010  . Cyst of ovary    bilateral  . Foot fracture, left    hx of  . Hyperlipidemia   . Hypothyroidism   . IBS (irritable bowel syndrome)   . Iron deficiency 07/20/2010  . Low back pain   . Lumbar disc disease 07/20/2010  . Osteoporosis 07/20/2010  . Post-operative nausea and vomiting   . 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 Vitamin D deficiency 07/20/2010   Past Surgical History:  Procedure Laterality Date  . COLONOSCOPY    . fibroid sx    . right lateral epicondylar    . TRIGGER FINGER RELEASE  2016  . VAGINAL HYSTERECTOMY    . WRIST FRACTURE SURGERY Left 05/25/2014   Social History   Socioeconomic History  . Marital status: Married    Spouse name: Not on file  . Number of children: Not on file  . Years of education: Not on file  . Highest education level: Not on file  Occupational History  . Occupation: cna  Social Needs  . Financial resource strain: Not on file  . Food insecurity:    Worry: Not on file    Inability: Not on file  . Transportation needs:    Medical: Not on  file    Non-medical: Not on file  Tobacco Use  . Smoking status: Former Research scientist (life sciences)  . Smokeless tobacco: Never Used  . Tobacco comment: quit 30 years ago  Substance and Sexual Activity  . Alcohol use: No  . Drug use: No  . Sexual activity: Not on file  Lifestyle  . Physical activity:    Days per week: Not on file    Minutes per session: Not on file  . Stress: Not on file  Relationships  . Social connections:    Talks on phone: Not on file    Gets together: Not on file    Attends religious service: Not on file    Active member of club or organization: Not on file    Attends meetings of clubs or organizations: Not on file    Relationship status: Not on file  Other Topics Concern  . 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         Allergies  Allergen Reactions  . Sulfa Antibiotics Other (See Comments)  . Sulfonamide  Derivatives Other (See Comments)    Pt unknown reaction    Family History  Problem Relation Age of Onset  . Alcohol abuse Mother   . Alcohol abuse Father   . Heart attack Father        died suddenly at home with presumed MI  . Colon cancer Neg Hx     Current Outpatient Medications (Endocrine & Metabolic):  .  denosumab (PROLIA) 60 MG/ML SOLN injection, Inject 1 Dose into the skin every 6 (six) months. .  levothyroxine (SYNTHROID) 75 MCG tablet, Take 1 tablet (75 mcg total) by mouth daily.    Current Outpatient Medications (Analgesics):  .  acetaminophen (TYLENOL) 500 MG tablet, Take 1,000 mg by mouth every 6 (six) hours as needed for mild pain, moderate pain or headache. .  eletriptan (RELPAX) 40 MG tablet, Take 1 tablet (40 mg total) by mouth daily as needed for migraine or headache. .  meloxicam (MOBIC) 15 MG tablet, TAKE 1 TABLET EACH DAY. .  naproxen sodium (ALEVE) 220 MG tablet, Take 220-440 mg by mouth 2 (two) times daily as  needed (PAIN). Marland Kitchen  traMADol (ULTRAM) 50 MG tablet, Take 1 tablet (50 mg total) by mouth every 8 (eight) hours as needed.   Current Outpatient Medications (Other):  .  Biotin 1 MG CAPS, Take 1 capsule by mouth daily. Marland Kitchen  docusate sodium (COLACE) 100 MG capsule, Take 200 mg by mouth daily. Marland Kitchen  gabapentin (NEURONTIN) 100 MG capsule, Take 2 capsules (200 mg total) by mouth at bedtime. .  Vitamin D, Ergocalciferol, (DRISDOL) 50000 units CAPS capsule, Take 1 capsule (50,000 Units total) by mouth every 7 (seven) days. Marland Kitchen  zolpidem (AMBIEN CR) 6.25 MG CR tablet, TAKE 1 TABLET AT BEDTIME AS NEEDED.    Past medical history, social, surgical and family history all reviewed in electronic medical record.  No pertanent information unless stated regarding to the chief complaint.   Review of Systems:  No headache, visual changes, nausea, vomiting, diarrhea, constipation, dizziness, abdominal pain, skin rash, fevers, chills, night sweats, weight loss, swollen lymph nodes, body aches,muscle aches, chest pain, shortness of breath, mood changes.  Positive joint swelling  Objective  Blood pressure 90/60, pulse 66, height 5\' 8"  (1.727 m), weight 136 lb (61.7 kg), SpO2 98 %.    General: No apparent distress alert and oriented x3 mood and affect normal, dressed appropriately.  HEENT: Pupils equal, extraocular movements intact  Respiratory: Patient's speak in full sentences and does not appear short of breath  Cardiovascular: No lower extremity edema, non tender, no erythema  Skin: Warm dry intact with no signs of infection or rash on extremities or on axial skeleton.  Abdomen: Soft nontender  Neuro: Cranial nerves II through XII are intact, neurovascularly intact in all extremities with 2+ DTRs and 2+ pulses.  Lymph: No lymphadenopathy of posterior or anterior cervical chain or axillae bilaterally.  Gait antalgic gait MSK:  Non tender with full range of motion and good stability and symmetric strength and tone of  shoulders, elbows, wrist, hip, knee and bilaterally.  Left foot and ankle does have some swelling noted.  Mostly on the dorsal aspect of the foot.  Severely tender over the midfoot.  Neurovascular intact distally with good deep tendon reflexes still present.  Does have range of motion of the ankle.  Limited musculoskeletal ultrasound was performed and interpreted by Lyndal Pulley  Limited ultrasound of patient's foot shows that there is some cortical defect still noted.  Seems to be  proximal to the Lisfranc joint as well. Impression: Foot fracture with potential Lisfranc involvement   Impression and Recommendations:     This case required medical decision making of moderate complexity. The above documentation has been reviewed and is accurate and complete Lyndal Pulley, DO       Note: This dictation was prepared with Dragon dictation along with smaller phrase technology. Any transcriptional errors that result from this process are unintentional.

## 2017-11-12 ENCOUNTER — Ambulatory Visit: Payer: Self-pay

## 2017-11-12 ENCOUNTER — Ambulatory Visit (INDEPENDENT_AMBULATORY_CARE_PROVIDER_SITE_OTHER)
Admission: RE | Admit: 2017-11-12 | Discharge: 2017-11-12 | Disposition: A | Discharge status: Home / Self Care | Payer: 59 | Source: Ambulatory Visit | Attending: Family Medicine | Admitting: Family Medicine

## 2017-11-12 ENCOUNTER — Ambulatory Visit (INDEPENDENT_AMBULATORY_CARE_PROVIDER_SITE_OTHER): Payer: 59 | Admitting: Family Medicine

## 2017-11-12 ENCOUNTER — Encounter: Payer: Self-pay | Admitting: Family Medicine

## 2017-11-12 VITALS — BP 90/60 | HR 66 | Ht 68.0 in | Wt 136.0 lb

## 2017-11-12 DIAGNOSIS — S99922A Unspecified injury of left foot, initial encounter: Secondary | ICD-10-CM | POA: Diagnosis not present

## 2017-11-12 DIAGNOSIS — M79672 Pain in left foot: Secondary | ICD-10-CM

## 2017-11-12 DIAGNOSIS — S93622A Sprain of tarsometatarsal ligament of left foot, initial encounter: Secondary | ICD-10-CM | POA: Diagnosis not present

## 2017-11-12 MED ORDER — TRAMADOL HCL 50 MG PO TABS: 50.0000 mg | ORAL_TABLET | Freq: Three times a day (TID) | ORAL | 0 refills | Status: DC | PRN

## 2017-11-12 MED ORDER — VITAMIN D (ERGOCALCIFEROL) 1.25 MG (50000 UNIT) PO CAPS: 50000.0000 [IU] | ORAL_CAPSULE | ORAL | 0 refills | Status: DC

## 2017-11-12 NOTE — Assessment & Plan Note (Signed)
Patient had more of a sprain of the midfoot.  I am concerned for fracture as well.  We will get standing x-rays to see if there is any splaying of the Lisfranc joint.  We discussed CAM Walker, topical medications, pain medication also given.  Low dose.  Will not likely refill.  Follow-up again in 3 weeks

## 2017-11-12 NOTE — Patient Instructions (Signed)
Good to see you  We will get you a boot Any walking please where the boot  Ice 20 minutes 2 times daily. Usually after activity and before bed. Once weekly vitamin D for 12 weeks K2 daily for next month  Xray downstairs See me again in 3 weeks

## 2017-12-03 ENCOUNTER — Other Ambulatory Visit: Payer: Self-pay | Admitting: Internal Medicine

## 2017-12-09 NOTE — Progress Notes (Signed)
[name] [name],  [address] Phone: [phone] Subjective:   [name], am serving as a scribe for Dr. Hulan Saas.   CC: Foot pain  MOQ:[id number]  [name] is a 57 y.o. female coming in with complaint of foot pain. She does feel like she has a "nerve" sensation when she touches the top of her foot. Does continue to have pain in the transverse arch. Does wear the boot during the day. Has taken a few steps at home without the boot and she states that it doesn't feel too bad. She is using Tramadol for pain. Would like more pain medication.  Patient has had a Lisfranc injury.  Been on the cam walker would like out of it.  Right hip pain.  Has had a history of greater trochanteric bursitis.  Secondary to patient being in the boot feels like this is being exacerbated again.  Also mentions left wrist pain from the accident. Has screws that are trying to come loose.  Patient states the discomfort but nothing severe.     Past Medical History:  Diagnosis Date  . Allergic rhinitis   . Anemia    history of anemia secondary to fibroid sx  . Anxiety and depression   . Cervical disc disease 07/20/2010  . Cyst of ovary    bilateral  . Foot fracture, left    hx of  . Hyperlipidemia   . Hypothyroidism   . IBS (irritable bowel syndrome)   . Iron deficiency 07/20/2010  . Low back pain   . Lumbar disc disease 07/20/2010  . Osteoporosis 07/20/2010  . Post-operative nausea and vomiting   . 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 Vitamin D deficiency 07/20/2010   Past Surgical History:  Procedure Laterality Date  . COLONOSCOPY    . fibroid sx    . right lateral epicondylar    . TRIGGER FINGER RELEASE  2016  . VAGINAL HYSTERECTOMY    . WRIST FRACTURE SURGERY Left 05/25/2014   Social History   Socioeconomic History  . Marital status: Married    Spouse name: Not on file  . Number of children: Not on file  . Years of education:  Not on file  . Highest education level: Not on file  Occupational History  . Occupation: cna  Social Needs  . Financial resource strain: Not on file  . Food insecurity:    Worry: Not on file    Inability: Not on file  . Transportation needs:    Medical: Not on file    Non-medical: Not on file  Tobacco Use  . Smoking status: Former Research scientist (life sciences)  . Smokeless tobacco: Never Used  . Tobacco comment: quit 30 years ago  Substance and Sexual Activity  . Alcohol use: Calderon  . Drug use: Calderon  . Sexual activity: Not on file  Lifestyle  . Physical activity:    Days per week: Not on file    Minutes per session: Not on file  . Stress: Not on file  Relationships  . Social connections:    Talks on phone: Not on file    Gets together: Not on file    Attends religious service: Not on file    Active member of club or organization: Not on file    Attends meetings of clubs or organizations: Not on file    Relationship status: Not on file  Other Topics Concern  . 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         Allergies  Allergen Reactions  . Sulfa Antibiotics Other (See Comments)  . Sulfonamide Derivatives Other (See Comments)    Pt unknown reaction    Family History  Problem Relation Age of Onset  . Alcohol abuse Mother   . Alcohol abuse Father   . Heart attack Father        died suddenly at home with presumed MI  . Colon cancer Neg Hx     Current Outpatient Medications (Endocrine & Metabolic):  .  denosumab (PROLIA) 60 MG/ML SOLN injection, Inject 1 Dose into the skin every 6 (six) months. .  levothyroxine (SYNTHROID) 75 MCG tablet, Take 1 tablet (75 mcg total) by mouth daily.    Current Outpatient Medications (Analgesics):  .  acetaminophen (TYLENOL) 500 MG tablet, Take 1,000 mg by mouth every 6 (six) hours as needed for mild pain, moderate pain or  headache. .  meloxicam (MOBIC) 15 MG tablet, TAKE 1 TABLET EACH DAY. .  naproxen sodium (ALEVE) 220 MG tablet, Take 220-440 mg by mouth 2 (two) times daily as needed (PAIN). Marland Kitchen  RELPAX 40 MG tablet, TAKE 1 TABLET DAILY AS NEEDED FOR MIGRAINE. Marland Kitchen  traMADol (ULTRAM) 50 MG tablet, Take 1 tablet (50 mg total) by mouth every 8 (eight) hours as needed.   Current Outpatient Medications (Other):  .  Biotin 1 MG CAPS, Take 1 capsule by mouth daily. Marland Kitchen  docusate sodium (COLACE) 100 MG capsule, Take 200 mg by mouth daily. Marland Kitchen  gabapentin (NEURONTIN) 100 MG capsule, Take 2 capsules (200 mg total) by mouth at bedtime. .  Vitamin D, Ergocalciferol, (DRISDOL) 50000 units CAPS capsule, Take 1 capsule (50,000 Units total) by mouth every 7 (seven) days. Marland Kitchen  zolpidem (AMBIEN CR) 6.25 MG CR tablet, TAKE 1 TABLET AT BEDTIME AS NEEDED.    Past medical history, social, surgical and family history all reviewed in electronic medical record.  Calderon pertanent information unless stated regarding to the chief complaint.   Review of Systems:  Calderon headache, visual changes, nausea, vomiting, diarrhea, constipation, dizziness, abdominal pain, skin rash, fevers, chills, night sweats, weight loss, swollen lymph nodes, body aches, joint swelling,  chest pain, shortness of breath, mood changes.  Positive muscle aches  Objective  Blood pressure 102/68, pulse 64, height 5\' 8"  (1.727 m), weight 135 lb (61.2 kg), SpO2 98 %.   General: Calderon apparent distress alert and oriented x3 mood and affect normal, dressed appropriately.  HEENT: Pupils equal, extraocular movements intact  Respiratory: Patient's speak in full sentences and does not appear short of breath  Cardiovascular: Calderon lower extremity edema, non tender, Calderon erythema  Skin: Warm dry intact with Calderon signs of infection or rash on extremities or on axial skeleton.  Abdomen: Soft nontender  Neuro: Cranial nerves II through XII are intact, neurovascularly intact in all extremities with 2+  DTRs and 2+ pulses.  Lymph: Calderon lymphadenopathy of posterior or anterior cervical chain or axillae bilaterally.  Gait antalgic but wearing a cam walker.  MSK:  tender with full range of motion and good stability and symmetric strength and tone of shoulders, elbows, wrist, hip, knee and ankles bilaterally.  Mild arthritic changes of multiple joints Left foot exam shows the patient does have an high arch.  Lisfranc joint has significant less swelling less tenderness to palpation the previous exam.  Patient's ankle normal does have full range of motion.  Limited muscular skeletal ultrasound was performed and interpreted by Lyndal Pulley ' Limited ultrasound of patient's Lisfranc joint shows the patient does have callus formation noted.  Patient though does have what appears to be a reactive neuroma between the fourth and fifth metatarsal heads with increasing Doppler flow.  Calderon increase in abnormal vascularity though.  Fifth metatarsal does have some callus formation also noted. Impression: Interval healing of Lisfranc injury   Right hip exam shows severe tenderness to palpation over the greater trochanteric area.  Positive Corky Sox.  Negative straight leg test.  Patient does have diffuse tenderness noted of multiple muscles.   Procedure: Real-time Ultrasound Guided Injection of right greater trochanteric bursitis secondary to patient's body habitus Device: GE Logiq Q7 Ultrasound guided injection is preferred based studies that show increased duration, increased effect, greater accuracy, decreased procedural pain, increased response rate, and decreased cost with ultrasound guided versus blind injection.  Verbal informed consent obtained.  Time-out conducted.  Noted Calderon overlying erythema, induration, or other signs of local infection.  Skin prepped in a sterile fashion.  Local anesthesia: Topical Ethyl chloride.  With sterile technique and under real time ultrasound guidance:  Greater trochanteric  area was visualized and patient's bursa was noted. A 22-gauge 3 inch needle was inserted and 4 cc of 0.5% Marcaine and 1 cc of Kenalog 40 mg/dL was injected. Pictures taken Completed without difficulty  Pain immediately resolved suggesting accurate placement of the medication.  Advised to call if fevers/chills, erythema, induration, drainage, or persistent bleeding.  Images permanently stored and available for review in the ultrasound unit.  Impression: Technically successful ultrasound guided injection.   Impression and Recommendations:     This case required medical decision making of moderate complexity. The above documentation has been reviewed and is accurate and complete Lyndal Pulley, DO       Note: This dictation was prepared with Dragon dictation along with smaller phrase technology. Any transcriptional errors that result from this process are unintentional.

## 2017-12-10 ENCOUNTER — Ambulatory Visit: Payer: Self-pay

## 2017-12-10 ENCOUNTER — Other Ambulatory Visit (INDEPENDENT_AMBULATORY_CARE_PROVIDER_SITE_OTHER): Payer: 59

## 2017-12-10 ENCOUNTER — Encounter: Payer: Self-pay | Admitting: Family Medicine

## 2017-12-10 ENCOUNTER — Ambulatory Visit (INDEPENDENT_AMBULATORY_CARE_PROVIDER_SITE_OTHER): Payer: 59 | Admitting: Family Medicine

## 2017-12-10 VITALS — BP 102/68 | HR 64 | Ht 68.0 in | Wt 135.0 lb

## 2017-12-10 DIAGNOSIS — M25551 Pain in right hip: Secondary | ICD-10-CM | POA: Diagnosis not present

## 2017-12-10 DIAGNOSIS — S93622A Sprain of tarsometatarsal ligament of left foot, initial encounter: Secondary | ICD-10-CM

## 2017-12-10 DIAGNOSIS — M7061 Trochanteric bursitis, right hip: Secondary | ICD-10-CM | POA: Diagnosis not present

## 2017-12-10 DIAGNOSIS — M2550 Pain in unspecified joint: Secondary | ICD-10-CM | POA: Diagnosis not present

## 2017-12-10 LAB — IBC PANEL
Iron: 80 ug/dL (ref 42–145)
Saturation Ratios: 21.1 % (ref 20.0–50.0)
Transferrin: 271 mg/dL (ref 212.0–360.0)

## 2017-12-10 LAB — FERRITIN: FERRITIN: 33.6 ng/mL (ref 10.0–291.0)

## 2017-12-10 LAB — VITAMIN D 25 HYDROXY (VIT D DEFICIENCY, FRACTURES): VITD: 54.27 ng/mL (ref 30.00–100.00)

## 2017-12-10 LAB — URIC ACID: Uric Acid, Serum: 1.8 mg/dL — ABNORMAL LOW (ref 2.4–7.0)

## 2017-12-10 LAB — SEDIMENTATION RATE: SED RATE: 8 mm/h (ref 0–30)

## 2017-12-10 LAB — C-REACTIVE PROTEIN: CRP: 0.1 mg/dL — ABNORMAL LOW (ref 0.5–20.0)

## 2017-12-10 NOTE — Patient Instructions (Signed)
Good to see you  Ice 20 minutes 2 times daily. Usually after activity and before bed. Injected the hip  Xelero, finn cofort or other rigid sole shoe with possible rocker bottom.  No barefoot and no walking for exercises until after Thanksgiving.  See me again in 4-6 weeks to make sure all the way there

## 2017-12-10 NOTE — Assessment & Plan Note (Signed)
Improvement noted, discussed changing footwear, we discussed icing regimen and home exercises.  Discussed which activities to doing which wants to avoid.  Increase activity slowly.  Follow-up again in 4 to 6 weeks

## 2017-12-10 NOTE — Assessment & Plan Note (Signed)
Repeat injection given today.  Discussed icing regimen and home exercise.  Discussed which activities of doing which wants to avoid.  Icing regimen.  Topical anti-inflammatories.  Follow-up again in 4 to 8 weeks

## 2017-12-11 LAB — RHEUMATOID FACTOR: Rhuematoid fact SerPl-aCnc: <14 IU/mL (ref <14)

## 2017-12-11 LAB — PTH, INTACT AND CALCIUM
CALCIUM: 9.1 mg/dL (ref 8.6–10.4)
PTH: 101 pg/mL — AB (ref 14–64)

## 2017-12-11 LAB — CALCIUM, IONIZED: Calcium, Ion: 4.88 mg/dL (ref 4.8–5.6)

## 2017-12-16 ENCOUNTER — Other Ambulatory Visit: Payer: Self-pay | Admitting: Internal Medicine

## 2017-12-16 NOTE — Telephone Encounter (Signed)
Copied from [place] [id number]. Topic: Quick Communication - See Telephone Encounter >> Dec 16, 2017 12:40 PM Vernona Rieger wrote: CRM for notification. See Telephone encounter for: 12/16/17.  traMADol (ULTRAM) 50 MG tablet  [facility]

## 2017-12-16 NOTE — Telephone Encounter (Signed)
Requested medication (s) are due for refill today: yes  Requested medication (s) are on the active medication list: yes    Last refill: 11/12/17  #30  0 refills  Future visit scheduled no  Notes to clinic:not delegated  Requested Prescriptions  Pending Prescriptions Disp Refills   traMADol (ULTRAM) 50 MG tablet 30 tablet 0    Sig: Take 1 tablet (50 mg total) by mouth every 8 (eight) hours as needed.     Not Delegated - Analgesics:  Opioid Agonists Failed - 12/16/2017 12:55 PM      Failed - This refill cannot be delegated      Failed - Urine Drug Screen completed in last 360 days.      Passed - Valid encounter within last 6 months    Recent Outpatient Visits          6 days ago Right hip pain   [place], [place], DO   1 month ago Left foot pain   [place], Olevia [place], DO   1 month ago Preventative health care   [facility] -Georges Mouse, MD   7 months ago RUQ pain   [place], James W, MD   7 months ago Pain of right hip joint   [place], [place], DO

## 2017-12-17 ENCOUNTER — Encounter: Payer: Self-pay | Admitting: Internal Medicine

## 2017-12-17 ENCOUNTER — Ambulatory Visit: Payer: Self-pay | Admitting: *Deleted

## 2017-12-17 DIAGNOSIS — E039 Hypothyroidism, unspecified: Secondary | ICD-10-CM

## 2017-12-17 MED ORDER — TRAMADOL HCL 50 MG PO TABS: 50.0000 mg | ORAL_TABLET | Freq: Three times a day (TID) | ORAL | 2 refills | Status: DC | PRN

## 2017-12-17 NOTE — Telephone Encounter (Signed)
Summary: thyroid med   Pt states she was in for a physical and Dr. Jenny Reichmann increased her thyroid medicine and she does not believe it is working. She states that her hair is getting thinner and thinner, having some depression, and more symptoms that she normally has when her thyroid levels are still high. She also states Dr. Tamala Julian did some lab work as well when she was there last week and he stated her thyroid level was to high, and she thinks maybe she is taking to much medication. She would like to talk to a nurse to discuss.       Reason for Disposition . [1] Follow-up call from patient regarding patient's clinical status AND [2] information NON-URGENT  Answer Assessment - Initial Assessment Questions 1. REASON FOR CALL or QUESTION: "What is your reason for calling today?" or "How can I best help you?" or "What question do you have that I can help answer?"     Patient has several questions for her PCP  Patient thought she had had her TSH checked with the labs Dr Tamala Julian had drawn- but it was her parathyroid that was abnormal. She can come in for her TSH recheck- she wants Dr Jenny Reichmann to review tests ordered to make sure there is no overlap- for insurance purposes. Please let her know when this is done and she will come over and have labs drawn.  Patient also wants to know if Biotin can effect TSH levels. She takes 5000 mg daily. She has read to let PCP know if she takes this supplement and thyroid medication.  Protocols used: PCP CALL - NO TRIAGE-A-AH, INFORMATION ONLY CALL-A-AH

## 2017-12-17 NOTE — Telephone Encounter (Signed)
Done erx 

## 2017-12-17 NOTE — Telephone Encounter (Signed)
MD approved and sent electronically to pof../lmb  

## 2017-12-17 NOTE — Addendum Note (Signed)
Addended by: Biagio Borg on: 12/17/2017 12:29 PM   Modules accepted: Orders

## 2017-12-17 NOTE — Telephone Encounter (Signed)
[place] for lab work - to repeat the thyroid levels  - order placed

## 2017-12-19 ENCOUNTER — Other Ambulatory Visit (INDEPENDENT_AMBULATORY_CARE_PROVIDER_SITE_OTHER): Payer: 59

## 2017-12-19 DIAGNOSIS — E039 Hypothyroidism, unspecified: Secondary | ICD-10-CM | POA: Diagnosis not present

## 2017-12-19 LAB — TSH: TSH: 2.95 u[IU]/mL (ref 0.35–4.50)

## 2017-12-19 LAB — T4, FREE: FREE T4: 1.06 ng/dL (ref 0.60–1.60)

## 2018-01-03 ENCOUNTER — Other Ambulatory Visit: Payer: Self-pay | Admitting: Internal Medicine

## 2018-01-06 ENCOUNTER — Telehealth: Payer: Self-pay | Admitting: Internal Medicine

## 2018-01-06 NOTE — Telephone Encounter (Signed)
Copied from [place] [id number]. Topic: Quick Communication - Rx Refill/Question >> Jan 06, 2018  2:09 PM Blase Mess A wrote: Medication: zolpidem (AMBIEN CR) 6.25 MG CR tablet [[id number]] ALPRAZolam (XANAX) 0.5 MG tablet [[id number]]  DISCONTINUED   Has the patient contacted their pharmacy? Yes  (Agent: If no, request that the patient contact the pharmacy for the refill.) (Agent: If yes, when and what did the pharmacy advise?)  Preferred Pharmacy (with phone number or street name): [facility] [place] [address] Phone: [phone] Fax: [phone]    Agent: Please be advised that RX refills may take up to 3 business days. We ask that you follow-up with your pharmacy.

## 2018-01-06 NOTE — Telephone Encounter (Signed)
Duplicate Message

## 2018-01-06 NOTE — Progress Notes (Signed)
[name] [name], [place] [address] Phone: [phone] Subjective:    I [name] am serving as a Education administrator for Dr. Hulan Calderon.   I'm seeing this patient by the request  of:    CC: Left foot pain, hip pain follow-up  [name]  [name] is a 57 y.o. female coming in with complaint of left foot pain. Talar dome is still sore. Believes she is making improvement. Right hip is still painful.  Has made improvement with the injections.  Does have some underlying back pain though.  Patient states that she has had epidurals in the back previously.  Does not want to go down that route again.  Concerned that he would need surgery and does not want to do that.  Patient states that overall things seem to be improving but is concerned not perfect yet.      Past Medical History:  Diagnosis Date  . Allergic rhinitis   . Anemia    history of anemia secondary to fibroid sx  . Anxiety and depression   . Cervical disc disease 07/20/2010  . Cyst of ovary    bilateral  . Foot fracture, left    hx of  . Hyperlipidemia   . Hypothyroidism   . IBS (irritable bowel syndrome)   . Iron deficiency 07/20/2010  . Low back pain   . Lumbar disc disease 07/20/2010  . Osteoporosis 07/20/2010  . Post-operative nausea and vomiting   . 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 Vitamin D deficiency 07/20/2010   Past Surgical History:  Procedure Laterality Date  . COLONOSCOPY    . fibroid sx    . right lateral epicondylar    . TRIGGER FINGER RELEASE  2016  . VAGINAL HYSTERECTOMY    . WRIST FRACTURE SURGERY Left 05/25/2014   Social History   Socioeconomic History  . Marital status: Married    Spouse name: Not on file  . Number of children: Not on file  . Years of education: Not on file  . Highest education level: Not on file  Occupational History  . Occupation: cna  Social Needs  . Financial resource strain: Not on file  . Food insecurity:   Worry: Not on file    Inability: Not on file  . Transportation needs:    Medical: Not on file    Non-medical: Not on file  Tobacco Use  . Smoking status: Former Research scientist (life sciences)  . Smokeless tobacco: Never Used  . Tobacco comment: quit 30 years ago  Substance and Sexual Activity  . Alcohol use: No  . Drug use: No  . Sexual activity: Not on file  Lifestyle  . Physical activity:    Days per week: Not on file    Minutes per session: Not on file  . Stress: Not on file  Relationships  . Social connections:    Talks on phone: Not on file    Gets together: Not on file    Attends religious service: Not on file    Active member of club or organization: Not on file    Attends meetings of clubs or organizations: Not on file    Relationship status: Not on file  Other Topics Concern  . 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         Allergies  Allergen Reactions  . Sulfa Antibiotics Other (See Comments)  . Sulfonamide Derivatives Other (See Comments)    Pt unknown reaction    Family History  Problem Relation Age of Onset  . Alcohol abuse Mother   . Alcohol abuse Father   . Heart attack Father        died suddenly at home with presumed MI  . Colon cancer Neg Hx     Current Outpatient Medications (Endocrine & Metabolic):  .  denosumab (PROLIA) 60 MG/ML SOLN injection, Inject 1 Dose into the skin every 6 (six) months. .  levothyroxine (SYNTHROID) 75 MCG tablet, Take 1 tablet (75 mcg total) by mouth daily.    Current Outpatient Medications (Analgesics):  .  acetaminophen (TYLENOL) 500 MG tablet, Take 1,000 mg by mouth every 6 (six) hours as needed for mild pain, moderate pain or headache. .  meloxicam (MOBIC) 15 MG tablet, TAKE 1 TABLET EACH DAY. .  naproxen sodium (ALEVE) 220 MG tablet, Take 220-440 mg by mouth 2 (two) times daily as needed (PAIN). Marland Kitchen  RELPAX 40  MG tablet, TAKE 1 TABLET DAILY AS NEEDED FOR MIGRAINE. Marland Kitchen  traMADol (ULTRAM) 50 MG tablet, Take 1 tablet (50 mg total) by mouth every 8 (eight) hours as needed.   Current Outpatient Medications (Other):  .  Biotin 1 MG CAPS, Take 1 capsule by mouth daily. Marland Kitchen  docusate sodium (COLACE) 100 MG capsule, Take 200 mg by mouth daily. Marland Kitchen  gabapentin (NEURONTIN) 100 MG capsule, Take 2 capsules (200 mg total) by mouth at bedtime. .  Vitamin D, Ergocalciferol, (DRISDOL) 50000 units CAPS capsule, Take 1 capsule (50,000 Units total) by mouth every 7 (seven) days. Marland Kitchen  zolpidem (AMBIEN CR) 6.25 MG CR tablet, TAKE 1 TABLET AT BEDTIME AS NEEDED.    Past medical history, social, surgical and family history all reviewed in electronic medical record.  No pertanent information unless stated regarding to the chief complaint.   Review of Systems:  No headache, visual changes, nausea, vomiting, diarrhea, constipation, dizziness, abdominal pain, skin rash, fevers, chills, night sweats, weight loss, swollen lymph nodes, body aches, joint swelling, muscle aches, chest pain, shortness of breath, mood changes.  Positive muscle aches  Objective  Blood pressure 100/62, pulse 74, height 5\' 8"  (1.727 m), SpO2 97 %.   General: No apparent distress alert and oriented x3 mood and affect normal, dressed appropriately.  HEENT: Pupils equal, extraocular movements intact  Respiratory: Patient's speak in full sentences and does not appear short of breath  Cardiovascular: No lower extremity edema, non tender, no erythema  Skin: Warm dry intact with no signs of infection or rash on extremities or on axial skeleton.  Abdomen: Soft nontender  Neuro: Cranial nerves II through XII are intact, neurovascularly intact in all extremities with 2+ DTRs and 2+ pulses.  Lymph: No lymphadenopathy of posterior or anterior cervical chain or axillae bilaterally.  Gait mild antalgic MSK:  Non tender with full range of motion and good stability and  symmetric strength and tone of shoulders, elbows, wrist, , knee and bilaterally.  Left foot exam shows the patient still moderately tender more over the talar dome today.  No swelling noted.  Lisfranc area seems to be improved.  Near full range of motion of the ankle.  Patient's low back exam does have some tenderness to palpation the paraspinal musculature on the right side.  Positive Faber with pain on the lateral aspect of the hip.  Negative straight leg test though noted today but  does have tightness of the hamstring.  Neurovascular intact distally.  4+ out of 5 strength in lower extremities but symmetric. \ Limited musculoskeletal ultrasound was performed and interpreted by Lyndal Pulley  Limited ultrasound of patient's Lisfranc injury seems to have good callus formation and is already with the reabsorption.  Patient does have a reactive extensor tenosynovitis over the talar dome area of the anterior tibialis tendon.  Otherwise fairly unremarkable.   Impression and Recommendations:     This case required medical decision making of moderate complexity. The above documentation has been reviewed and is accurate and complete Lyndal Pulley, DO       Note: This dictation was prepared with Dragon dictation along with smaller phrase technology. Any transcriptional errors that result from this process are unintentional.

## 2018-01-06 NOTE — Telephone Encounter (Signed)
   LOV:10/21/17 NextOV:n/s Last Filled/Quantity:09/18/17 30#

## 2018-01-06 NOTE — Telephone Encounter (Signed)
Has been sent to PCP this morning. Awaiting approval.

## 2018-01-06 NOTE — Telephone Encounter (Signed)
Done erx 

## 2018-01-07 ENCOUNTER — Encounter: Payer: Self-pay | Admitting: Family Medicine

## 2018-01-07 ENCOUNTER — Ambulatory Visit (INDEPENDENT_AMBULATORY_CARE_PROVIDER_SITE_OTHER): Payer: 59 | Admitting: Family Medicine

## 2018-01-07 ENCOUNTER — Ambulatory Visit (INDEPENDENT_AMBULATORY_CARE_PROVIDER_SITE_OTHER)
Admission: RE | Admit: 2018-01-07 | Discharge: 2018-01-07 | Disposition: A | Discharge status: Home / Self Care | Payer: 59 | Source: Ambulatory Visit | Attending: Family Medicine | Admitting: Family Medicine

## 2018-01-07 VITALS — BP 100/62 | HR 74 | Ht 68.0 in

## 2018-01-07 DIAGNOSIS — S93622A Sprain of tarsometatarsal ligament of left foot, initial encounter: Secondary | ICD-10-CM

## 2018-01-07 DIAGNOSIS — G8929 Other chronic pain: Secondary | ICD-10-CM

## 2018-01-07 DIAGNOSIS — M549 Dorsalgia, unspecified: Secondary | ICD-10-CM

## 2018-01-07 DIAGNOSIS — M545 Low back pain: Secondary | ICD-10-CM | POA: Diagnosis not present

## 2018-01-07 DIAGNOSIS — M7061 Trochanteric bursitis, right hip: Secondary | ICD-10-CM

## 2018-01-07 NOTE — Patient Instructions (Signed)
Good to see you  Ice is your friend Try the new exercises for the ankle Xray of the back downstairs Ok to start walking in the good shoes Continue the vitamins See me again in 6 weeks

## 2018-01-07 NOTE — Assessment & Plan Note (Signed)
Improvement at this time.  Extensor tendinitis noted.  Discussed icing regimen and home exercise.  Discussed shoe choices.  Can start increasing activity as tolerated.  Follow-up again in 4 to 8 weeks

## 2018-01-07 NOTE — Assessment & Plan Note (Signed)
He has had repeat injections.  X-rays of back ordered today to further evaluate for radicular symptoms that could be contributing to the aches and pains.  Discussed icing regimen and home exercises.  Discussed which activities to do which wants to avoid.  Patient will increase activity slowly over the course the next several days.  Follow-up with me again in 4 to 8 weeks.

## 2018-01-11 ENCOUNTER — Other Ambulatory Visit: Payer: Self-pay | Admitting: Internal Medicine

## 2018-01-13 NOTE — Telephone Encounter (Signed)
Done erx 

## 2018-01-14 ENCOUNTER — Other Ambulatory Visit: Payer: Self-pay | Admitting: Internal Medicine

## 2018-01-22 ENCOUNTER — Encounter: Payer: Self-pay | Admitting: Family Medicine

## 2018-01-22 ENCOUNTER — Ambulatory Visit: Payer: Self-pay

## 2018-01-22 ENCOUNTER — Ambulatory Visit (INDEPENDENT_AMBULATORY_CARE_PROVIDER_SITE_OTHER)
Admission: RE | Admit: 2018-01-22 | Discharge: 2018-01-22 | Disposition: A | Discharge status: Home / Self Care | Payer: 59 | Source: Ambulatory Visit | Attending: Family Medicine | Admitting: Family Medicine

## 2018-01-22 ENCOUNTER — Ambulatory Visit (INDEPENDENT_AMBULATORY_CARE_PROVIDER_SITE_OTHER): Payer: 59 | Admitting: Family Medicine

## 2018-01-22 VITALS — HR 75 | Ht 68.0 in | Wt 134.0 lb

## 2018-01-22 DIAGNOSIS — M25551 Pain in right hip: Secondary | ICD-10-CM | POA: Diagnosis not present

## 2018-01-22 DIAGNOSIS — E559 Vitamin D deficiency, unspecified: Secondary | ICD-10-CM

## 2018-01-22 DIAGNOSIS — S79911A Unspecified injury of right hip, initial encounter: Secondary | ICD-10-CM | POA: Diagnosis not present

## 2018-01-22 DIAGNOSIS — M7061 Trochanteric bursitis, right hip: Secondary | ICD-10-CM | POA: Diagnosis not present

## 2018-01-22 NOTE — Patient Instructions (Signed)
Good to see you  Xray downstairs Continue all the meds and vitamins Ice 20 minutes 2 times daily. Usually after activity and before bed. Try the muscle relaxer at night Maybe a new leash for the puppy  Happy holidays!  See me again in 4 weeks

## 2018-01-22 NOTE — Assessment & Plan Note (Signed)
Continue vitamin D supplementation 

## 2018-01-22 NOTE — Assessment & Plan Note (Signed)
Bilateral injections given today.  Discussed with patient in great length.  I am concerned with some of the internal rotation for potential stress reaction.  Patient will have x-rays today.  History of osteoporosis.  Patient has had laboratory work-up that was fairly unremarkable except for vitamin D and patient is taking that on a regular basis.  We discussed the possibility of advanced imaging may be necessary if this pain continues with the amount that she is having.  Seems to be out of proportion.  Patient will follow-up with me again in 3 to 4 weeks

## 2018-01-22 NOTE — Progress Notes (Signed)
[name] [place], [place] [address] Phone: [phone] Subjective:    Fontaine No, am serving as a scribe for Dr. Hulan Saas.   CC: right hip pain, foot pain follow up   [id number]  [name] is a 57 y.o. female coming in with complaint of right hip pain. Pain from bursitis has increased since last visit. She also has had another injury to her groin from taking her puppy on a leash. Constant sharp pain in the right groin. Patient has hard time sitting down due to pain. Patient is having radiating pain down her right leg coming from her back.     back x-rays show moderate degenerative changes in the space narrowing  Past Medical History:  Diagnosis Date  . Allergic rhinitis   . Anemia    history of anemia secondary to fibroid sx  . Anxiety and depression   . Cervical disc disease 07/20/2010  . Cyst of ovary    bilateral  . Foot fracture, left    hx of  . Hyperlipidemia   . Hypothyroidism   . IBS (irritable bowel syndrome)   . Iron deficiency 07/20/2010  . Low back pain   . Lumbar disc disease 07/20/2010  . Osteoporosis 07/20/2010  . Post-operative nausea and vomiting   . 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 Vitamin D deficiency 07/20/2010   Past Surgical History:  Procedure Laterality Date  . COLONOSCOPY    . fibroid sx    . right lateral epicondylar    . TRIGGER FINGER RELEASE  2016  . VAGINAL HYSTERECTOMY    . WRIST FRACTURE SURGERY Left 05/25/2014   Social History   Socioeconomic History  . Marital status: Married    Spouse name: Not on file  . Number of children: Not on file  . Years of education: Not on file  . Highest education level: Not on file  Occupational History  . Occupation: cna  Social Needs  . Financial resource strain: Not on file  . Food insecurity:    Worry: Not on file    Inability: Not on file  . Transportation needs:    Medical: Not on file    Non-medical: Not on file  Tobacco  Use  . Smoking status: Former Research scientist (life sciences)  . Smokeless tobacco: Never Used  . Tobacco comment: quit 30 years ago  Substance and Sexual Activity  . Alcohol use: No  . Drug use: No  . Sexual activity: Not on file  Lifestyle  . Physical activity:    Days per week: Not on file    Minutes per session: Not on file  . Stress: Not on file  Relationships  . Social connections:    Talks on phone: Not on file    Gets together: Not on file    Attends religious service: Not on file    Active member of club or organization: Not on file    Attends meetings of clubs or organizations: Not on file    Relationship status: Not on file  Other Topics Concern  . 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         Allergies  Allergen Reactions  . Sulfa Antibiotics Other (See Comments)  . Sulfonamide Derivatives Other (See Comments)    Pt unknown reaction    Family History  Problem Relation Age of Onset  . Alcohol abuse Mother   . Alcohol abuse Father   . Heart attack Father        died suddenly at home with presumed MI  . Colon cancer Neg Hx     Current Outpatient Medications (Endocrine & Metabolic):  .  denosumab (PROLIA) 60 MG/ML SOLN injection, Inject 1 Dose into the skin every 6 (six) months. .  levothyroxine (SYNTHROID, LEVOTHROID) 75 MCG tablet, TAKE 1 TABLET EACH DAY.    Current Outpatient Medications (Analgesics):  .  acetaminophen (TYLENOL) 500 MG tablet, Take 1,000 mg by mouth every 6 (six) hours as needed for mild pain, moderate pain or headache. .  meloxicam (MOBIC) 15 MG tablet, TAKE 1 TABLET EACH DAY. .  naproxen sodium (ALEVE) 220 MG tablet, Take 220-440 mg by mouth 2 (two) times daily as needed (PAIN). Marland Kitchen  RELPAX 40 MG tablet, TAKE 1 TABLET DAILY AS NEEDED FOR MIGRAINE. Marland Kitchen  traMADol (ULTRAM) 50 MG tablet, Take 1 tablet (50 mg total) by mouth every 8 (eight)  hours as needed.   Current Outpatient Medications (Other):  Marland Kitchen  ALPRAZolam (XANAX) 0.5 MG tablet, 1 tab by mouth daily as needed .  Biotin 1 MG CAPS, Take 1 capsule by mouth daily. Marland Kitchen  docusate sodium (COLACE) 100 MG capsule, Take 200 mg by mouth daily. Marland Kitchen  gabapentin (NEURONTIN) 100 MG capsule, Take 2 capsules (200 mg total) by mouth at bedtime. .  Vitamin D, Ergocalciferol, (DRISDOL) 50000 units CAPS capsule, Take 1 capsule (50,000 Units total) by mouth every 7 (seven) days. Marland Kitchen  zolpidem (AMBIEN CR) 6.25 MG CR tablet, TAKE 1 TABLET AT BEDTIME AS NEEDED.    Past medical history, social, surgical and family history all reviewed in electronic medical record.  No pertanent information unless stated regarding to the chief complaint.   Review of Systems:  No headache, visual changes, nausea, vomiting, diarrhea, constipation, dizziness, abdominal pain, skin rash, fevers, chills, night sweats, weight loss, swollen lymph nodes,  chest pain, shortness of breath, mood changes.  Positive muscle aches, body aches  Objective  Pulse 75, height 5\' 8"  (1.727 m), weight 134 lb (60.8 kg), SpO2 98 %.   General: No apparent distress alert and oriented x3 mood and affect normal, dressed appropriately.  HEENT: Pupils equal, extraocular movements intact  Respiratory: Patient's speak in full sentences and does not appear short of breath  Cardiovascular: No lower extremity edema, non tender, no erythema  Skin: Warm dry intact with no signs of infection or rash on extremities or on axial skeleton.  Abdomen: Soft nontender  Neuro: Cranial nerves II through XII are intact, neurovascularly intact in all extremities with 2+ DTRs and 2+ pulses.  Lymph: No lymphadenopathy of posterior or anterior cervical chain or axillae bilaterally.  Gait severely antalgic.  MSK:  tender with limited range of motion and stability and symmetric strength and tone of shoulders, elbows, wrist, , knee and ankles bilaterally.  Right hip  exam shows that patient does walk with a mild internal rotation.  Patient does have more of a toe touching low rotation when walking.  Moderate to severe tenderness greater trochanteric bursitis bilaterally.  Patient no longer has some tenderness in the inguinal area on the right side.  No masses appreciated.  Patient has mild pain with internal rotation.  Straight leg test causes some mild radicular symptoms.   Procedure: Real-time Ultrasound Guided Injection of right greater trochanteric bursitis secondary to patient's body habitus Device: GE Logiq Q7  Ultrasound guided injection is preferred based studies that show increased duration, increased effect, greater accuracy, decreased procedural pain, increased response rate, and decreased cost with ultrasound guided versus blind injection.  Verbal informed consent obtained.  Time-out conducted.  Noted no overlying erythema, induration, or other signs of local infection.  Skin prepped in a sterile fashion.  Local anesthesia: Topical Ethyl chloride.  With sterile technique and under real time ultrasound guidance:  Greater trochanteric area was visualized and patient's bursa was noted. A 22-gauge 3 inch needle was inserted and 4 cc of 0.5% Marcaine and 1 cc of Kenalog 40 mg/dL was injected. Pictures taken Completed without difficulty  Pain immediately resolved suggesting accurate placement of the medication.  Advised to call if fevers/chills, erythema, induration, drainage, or persistent bleeding.  Images permanently stored and available for review in the ultrasound unit.  Impression: Technically successful ultrasound guided injection.   Procedure: Real-time Ultrasound Guided Injection of left  greater trochanteric bursitis secondary to patient's body habitus Device: GE Logiq Q7  Ultrasound guided injection is preferred based studies that show increased duration, increased effect, greater accuracy, decreased procedural pain, increased response rate,  and decreased cost with ultrasound guided versus blind injection.  Verbal informed consent obtained.  Time-out conducted.  Noted no overlying erythema, induration, or other signs of local infection.  Skin prepped in a sterile fashion.  Local anesthesia: Topical Ethyl chloride.  With sterile technique and under real time ultrasound guidance:  Greater trochanteric area was visualized and patient's bursa was noted. A 22-gauge 3 inch needle was inserted and 4 cc of 0.5% Marcaine and 1 cc of Kenalog 40 mg/dL was injected. Pictures taken Completed without difficulty  Pain immediately resolved suggesting accurate placement of the medication.  Advised to call if fevers/chills, erythema, induration, drainage, or persistent bleeding.  Images permanently stored and available for review in the ultrasound unit.  Impression: Technically successful ultrasound guided injection.     Impression and Recommendations:     This case required medical decision making of moderate complexity. The above documentation has been reviewed and is accurate and complete Carol Pulley, DO       Note: This dictation was prepared with Dragon dictation along with smaller phrase technology. Any transcriptional errors that result from this process are unintentional.

## 2018-02-03 ENCOUNTER — Other Ambulatory Visit: Payer: Self-pay | Admitting: Family Medicine

## 2018-02-26 DIAGNOSIS — Z01419 Encounter for gynecological examination (general) (routine) without abnormal findings: Secondary | ICD-10-CM | POA: Diagnosis not present

## 2018-02-26 DIAGNOSIS — Z6821 Body mass index (BMI) 21.0-21.9, adult: Secondary | ICD-10-CM | POA: Diagnosis not present

## 2018-03-26 ENCOUNTER — Telehealth: Payer: Self-pay | Admitting: *Deleted

## 2018-03-26 ENCOUNTER — Other Ambulatory Visit: Payer: Self-pay | Admitting: Internal Medicine

## 2018-03-26 NOTE — Telephone Encounter (Signed)
Pt called stating that she injured her back working out and is requesting a prescription of Prednisone sent into her pharmacy.

## 2018-03-27 MED ORDER — PREDNISONE 50 MG PO TABS: 50.0000 mg | ORAL_TABLET | Freq: Every day | ORAL | 0 refills | Status: DC

## 2018-03-27 NOTE — Telephone Encounter (Signed)
Done

## 2018-05-06 ENCOUNTER — Other Ambulatory Visit: Payer: Self-pay | Admitting: Internal Medicine

## 2018-05-12 DIAGNOSIS — L245 Irritant contact dermatitis due to other chemical products: Secondary | ICD-10-CM | POA: Diagnosis not present

## 2018-06-08 ENCOUNTER — Encounter (HOSPITAL_COMMUNITY): Payer: Self-pay

## 2018-06-08 ENCOUNTER — Emergency Department (HOSPITAL_COMMUNITY): Payer: 59

## 2018-06-08 ENCOUNTER — Emergency Department (HOSPITAL_COMMUNITY)
Admission: EM | Admit: 2018-06-08 | Discharge: 2018-06-08 | Disposition: A | Discharge status: Home / Self Care | Payer: 59 | Attending: Emergency Medicine | Admitting: Emergency Medicine

## 2018-06-08 ENCOUNTER — Other Ambulatory Visit: Payer: Self-pay

## 2018-06-08 DIAGNOSIS — Z79899 Other long term (current) drug therapy: Secondary | ICD-10-CM | POA: Diagnosis not present

## 2018-06-08 DIAGNOSIS — E039 Hypothyroidism, unspecified: Secondary | ICD-10-CM | POA: Insufficient documentation

## 2018-06-08 DIAGNOSIS — Z87891 Personal history of nicotine dependence: Secondary | ICD-10-CM | POA: Diagnosis not present

## 2018-06-08 DIAGNOSIS — Y93K1 Activity, walking an animal: Secondary | ICD-10-CM | POA: Insufficient documentation

## 2018-06-08 DIAGNOSIS — S43401A Unspecified sprain of right shoulder joint, initial encounter: Secondary | ICD-10-CM | POA: Insufficient documentation

## 2018-06-08 DIAGNOSIS — Y999 Unspecified external cause status: Secondary | ICD-10-CM | POA: Insufficient documentation

## 2018-06-08 DIAGNOSIS — Z791 Long term (current) use of non-steroidal anti-inflammatories (NSAID): Secondary | ICD-10-CM | POA: Insufficient documentation

## 2018-06-08 DIAGNOSIS — W010XXA Fall on same level from slipping, tripping and stumbling without subsequent striking against object, initial encounter: Secondary | ICD-10-CM | POA: Diagnosis not present

## 2018-06-08 DIAGNOSIS — M25511 Pain in right shoulder: Secondary | ICD-10-CM | POA: Diagnosis not present

## 2018-06-08 DIAGNOSIS — S4991XA Unspecified injury of right shoulder and upper arm, initial encounter: Secondary | ICD-10-CM | POA: Diagnosis present

## 2018-06-08 DIAGNOSIS — Y929 Unspecified place or not applicable: Secondary | ICD-10-CM | POA: Insufficient documentation

## 2018-06-08 MED ORDER — TRAMADOL HCL 50 MG PO TABS: 50.0000 mg | ORAL_TABLET | Freq: Four times a day (QID) | ORAL | 0 refills | Status: DC | PRN

## 2018-06-08 MED ORDER — ONDANSETRON 4 MG PO TBDP
4.0000 mg | ORAL_TABLET | Freq: Once | ORAL | Status: AC
  Administered 2018-06-08: 4 mg via ORAL
  Filled 2018-06-08: qty 1

## 2018-06-08 MED ORDER — OXYCODONE-ACETAMINOPHEN 5-325 MG PO TABS
1.0000 | ORAL_TABLET | Freq: Once | ORAL | Status: AC
  Administered 2018-06-08: 1 via ORAL
  Filled 2018-06-08: qty 1

## 2018-06-08 NOTE — Discharge Instructions (Signed)
ontact a health care provider if: Your pain gets worse. Your pain is not relieved with medicines. You have increased redness or swelling. Get help right away if: You have a fever. You cannot move your arm or shoulder. You develop severe numbness or tingling in your arm, hand, or fingers. Your arm, hand, or fingers feel cold and turn blue, white, or gray.

## 2018-06-08 NOTE — ED Provider Notes (Signed)
[facility] DEPT Provider Note   CSN: [id number] Arrival date & time: 06/08/18  1733    History   Chief Complaint Chief Complaint  Patient presents with  . Arm Pain    HPI [name] is a 58 y.o. female who arrives to the emergency department with chief complaint of fall and right shoulder pain.  Patient states that she was walking her dog when her neighbors came by with their dog and her dog got excited and pulled her.  She was told and fell directly onto her right shoulder.  She denies numbness or tingling.  She does feel a burning sensation over the right deltoid.  She did not hit her head or lose consciousness.  She has no previous injuries to the area.     HPI  Past Medical History:  Diagnosis Date  . Allergic rhinitis   . Anemia    history of anemia secondary to fibroid sx  . Anxiety and depression   . Cervical disc disease 07/20/2010  . Cyst of ovary    bilateral  . Foot fracture, left    hx of  . Hyperlipidemia   . Hypothyroidism   . IBS (irritable bowel syndrome)   . Iron deficiency 07/20/2010  . Low back pain   . Lumbar disc disease 07/20/2010  . Osteoporosis 07/20/2010  . Post-operative nausea and vomiting   . 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 Vitamin D deficiency 07/20/2010    Patient Active Problem List   Diagnosis Date Noted  . Lisfranc's sprain, left, initial encounter 11/12/2017  . RUQ pain 04/25/2017  . Rash 10/20/2016  . Nonallopathic lesion of cervical region 08/30/2016  . Nonallopathic lesion of thoracic region 08/30/2016  . Nonallopathic lesion of rib cage 08/30/2016  . Venous (peripheral) insufficiency 10/25/2015  . 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 Bursitis of right shoulder 06/07/2015  . Left rotator cuff tear 06/07/2015  . Trigger middle finger of right hand 06/07/2015  . Hearing loss 10/07/2014  . Anemia, iron deficiency 02/11/2014  . Acute upper respiratory infection 02/09/2014  . Fatigue  02/09/2014  . Greater trochanteric bursitis 09/09/2013  . Other malaise and fatigue 08/18/2013  . Right hip pain 08/18/2013  . Insomnia 07/20/2010  . Lumbar disc disease 07/20/2010  . Cervical disc disease 07/20/2010  . Vitamin D deficiency 07/20/2010  . Iron deficiency 07/20/2010  . Osteoporosis 07/20/2010  . 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 Preventative health care 07/18/2010  . Palpitations 06/06/2010  . Anal fissure 10/04/2009  . RECTAL BLEEDING 10/04/2009  . NAUSEA 10/04/2009  . BACK PAIN 04/11/2009  . FRACTURE, FOOT 11/12/2008  . HYPERLIPIDEMIA 03/12/2007  . Anxiety state 03/12/2007  . Depression 03/12/2007  . COMMON MIGRAINE 03/12/2007  . ALLERGIC RHINITIS 03/12/2007  . IBS 03/12/2007  . Pain in thoracic spine 03/12/2007  . LOW BACK PAIN 03/12/2007  . PERIPHERAL EDEMA 03/12/2007  . Hypothyroidism 11/29/2006  . Headache(784.0) 11/29/2006    Past Surgical History:  Procedure Laterality Date  . COLONOSCOPY    . fibroid sx    . right lateral epicondylar    . TRIGGER FINGER RELEASE  2016  . VAGINAL HYSTERECTOMY    . WRIST FRACTURE SURGERY Left 05/25/2014     OB History   No obstetric history on file.      Home Medications    Prior to Admission medications   Medication Sig Start Date End Date Taking? Authorizing Provider  acetaminophen (TYLENOL) 500 MG tablet Take 1,000 mg  by mouth every 6 (six) hours as needed for mild pain, moderate pain or headache.    [provider]  ALPRAZolam Duanne Moron) 0.5 MG tablet 1 tab by mouth daily as needed 01/13/18   Biagio Borg, MD  Biotin 1 MG CAPS Take 1 capsule by mouth daily.    [provider]  denosumab (PROLIA) 60 MG/ML SOLN injection Inject 1 Dose into the skin every 6 (six) months.    [provider]  docusate sodium (COLACE) 100 MG capsule Take 200 mg by mouth daily.    [provider]  gabapentin (NEURONTIN) 100 MG capsule Take 2 capsules (200 mg total) by mouth at bedtime. 08/30/16    Lyndal Pulley, DO  levothyroxine (SYNTHROID, LEVOTHROID) 75 MCG tablet TAKE 1 TABLET EACH DAY. 01/14/18   Biagio Borg, MD  meloxicam (MOBIC) 15 MG tablet TAKE 1 TABLET EACH DAY. 03/26/18   Biagio Borg, MD  naproxen sodium (ALEVE) 220 MG tablet Take 220-440 mg by mouth 2 (two) times daily as needed (PAIN).    [provider]  predniSONE (DELTASONE) 50 MG tablet Take 1 tablet (50 mg total) by mouth daily. 03/27/18   Lyndal Pulley, DO  RELPAX 40 MG tablet TAKE 1 TABLET DAILY AS NEEDED FOR MIGRAINE. 05/07/18   Biagio Borg, MD  traMADol (ULTRAM) 50 MG tablet Take 1 tablet (50 mg total) by mouth every 8 (eight) hours as needed. 12/17/17   Biagio Borg, MD  Vitamin D, Ergocalciferol, (DRISDOL) 1.25 MG (50000 UT) CAPS capsule TAKE ONE CAPSULE ONCE A WEEK. 02/03/18   Lyndal Pulley, DO  zolpidem (AMBIEN CR) 6.25 MG CR tablet TAKE 1 TABLET AT BEDTIME AS NEEDED. 01/06/18   Biagio Borg, MD    Family History Family History  Problem Relation Age of Onset  . Alcohol abuse Mother   . Alcohol abuse Father   . Heart attack Father        died suddenly at home with presumed MI  . Colon cancer Neg Hx     Social History Social History   Tobacco Use  . Smoking status: Former Research scientist (life sciences)  . Smokeless tobacco: Never Used  . Tobacco comment: quit 30 years ago  Substance Use Topics  . Alcohol use: No  . Drug use: No     Allergies   Sulfa antibiotics and Sulfonamide derivatives   Review of Systems Review of Systems Ten systems reviewed and are negative for acute change, except as noted in the HPI.    Physical Exam Updated Vital Signs BP (!) 151/83   Pulse 81   Temp 99.1 F (37.3 C) (Oral)   Resp 18   Ht 5\' 7"  (1.702 m)   Wt 61.2 kg   SpO2 100%   BMI 21.14 kg/m   Physical Exam Vitals signs and nursing note reviewed.  Constitutional:      General: She is not in acute distress.    Appearance: She is well-developed. She is not diaphoretic.  HENT:     Head: Normocephalic  and atraumatic.  Eyes:     General: No scleral icterus.    Conjunctiva/sclera: Conjunctivae normal.  Neck:     Musculoskeletal: Normal range of motion.  Cardiovascular:     Rate and Rhythm: Normal rate and regular rhythm.     Heart sounds: Normal heart sounds. No murmur. No friction rub. No gallop.   Pulmonary:     Effort: Pulmonary effort is normal. No respiratory distress.  Breath sounds: Normal breath sounds.  Abdominal:     General: Bowel sounds are normal. There is no distension.     Palpations: Abdomen is soft. There is no mass.     Tenderness: There is no abdominal tenderness. There is no guarding.  Musculoskeletal:     Comments: Patient holding the right shoulder in a splinted position.  Tenderness over the right proximal humerus, no deformities or tenderness noted along the clavicle.  No tenderness along the scapula.  Normal ipsilateral right elbow wrist and hand examination.  Skin:    General: Skin is warm and dry.  Neurological:     Mental Status: She is alert and oriented to person, place, and time.  Psychiatric:        Behavior: Behavior normal.      ED Treatments / Results  Labs (all labs ordered are listed, but only abnormal results are displayed) Labs Reviewed - No data to display  EKG None  Radiology No results found.  Procedures Procedures (including critical care time)  Medications Ordered in ED Medications  oxyCODONE-acetaminophen (PERCOCET/ROXICET) 5-325 MG per tablet 1 tablet (has no administration in time range)  ondansetron (ZOFRAN-ODT) disintegrating tablet 4 mg (has no administration in time range)     Initial Impression / Assessment and Plan / ED Course  I have reviewed the triage vital signs and the nursing notes.  Pertinent labs & imaging results that were available during my care of the patient were reviewed by me and considered in my medical decision making (see chart for details).        I personally reviewed the patient's  X-Ray which is  negative for obvious fracture or dislocation. Pain managed in ED. Pt advised to follow up with orthopedics if symptoms persist for possibility of missed fracture diagnosis. Patient given brace while in ED, conservative therapy recommended and discussed. Patient will be dc home & is agreeable with above plan.   Final Clinical Impressions(s) / ED Diagnoses   Final diagnoses:  None    ED Discharge Orders    None       Margarita Mail, PA-C 06/08/18 2316    Lacretia Leigh, MD 06/11/18 [phone]

## 2018-06-08 NOTE — ED Triage Notes (Signed)
Pulled down by dog and now right shoulder pain unable to see if there is any deformity noted no other complaints of pain.

## 2018-06-12 DIAGNOSIS — M25511 Pain in right shoulder: Secondary | ICD-10-CM | POA: Insufficient documentation

## 2018-06-16 DIAGNOSIS — M25511 Pain in right shoulder: Secondary | ICD-10-CM | POA: Diagnosis not present

## 2018-06-16 DIAGNOSIS — S46119A Strain of muscle, fascia and tendon of long head of biceps, unspecified arm, initial encounter: Secondary | ICD-10-CM | POA: Insufficient documentation

## 2018-06-16 DIAGNOSIS — S46111A Strain of muscle, fascia and tendon of long head of biceps, right arm, initial encounter: Secondary | ICD-10-CM | POA: Diagnosis not present

## 2018-07-01 DIAGNOSIS — M25511 Pain in right shoulder: Secondary | ICD-10-CM | POA: Diagnosis not present

## 2018-07-16 DIAGNOSIS — S42253A Displaced fracture of greater tuberosity of unspecified humerus, initial encounter for closed fracture: Secondary | ICD-10-CM | POA: Insufficient documentation

## 2018-07-17 ENCOUNTER — Telehealth: Payer: Self-pay | Admitting: Internal Medicine

## 2018-07-17 ENCOUNTER — Telehealth: Payer: Self-pay

## 2018-07-17 ENCOUNTER — Other Ambulatory Visit: Payer: Self-pay | Admitting: Internal Medicine

## 2018-07-17 NOTE — Telephone Encounter (Signed)
Copied from [place] [id number]. Topic: General - Other >> Jul 17, 2018 10:13 AM Valla Leaver wrote: Reason for CRM: Patient needs to know if Prolia is due? >> Jul 17, 2018 11:25 AM Morey Hummingbird wrote: Patient is due. Do you have benefit information to get patient scheduled?

## 2018-07-17 NOTE — Telephone Encounter (Signed)
Patient was needing a PA. Called the insurance company to follow up and they are saying that she does not have active coverage.  Called patient and left message for her to call back.

## 2018-07-17 NOTE — Telephone Encounter (Signed)
Pt insurance is correct in system.  Please call back to let pt know status of prolia

## 2018-07-17 NOTE — Telephone Encounter (Signed)
Copied from [place] [id number]. Topic: General - Other >> Jul 17, 2018 10:13 AM Valla Leaver wrote: Reason for CRM: Patient needs to know if Prolia is due? >> Jul 17, 2018 11:25 AM Morey Hummingbird wrote: Patient is due. Do you have benefit information to get patient scheduled?   Patient'[s insurance is requiring PA approval, we have faxed form previously and still waiting on decision from insurance co---carson/sched to check with margaret/prolia to see if we can get approval by phone, will call patient back with findings

## 2018-07-18 NOTE — Telephone Encounter (Signed)
Patient states this is the the correct insurance. I was able to get in touch with someone at [facility] that stated they did not have a PA on file for the patient. This has been initiated and they will notify the office with approval or denial.  Contacted patient and informed.

## 2018-07-20 ENCOUNTER — Other Ambulatory Visit: Payer: Self-pay | Admitting: Internal Medicine

## 2018-07-21 NOTE — Telephone Encounter (Signed)
[place] Controlled Database Checked Last filled: 06/04/18 # 30 LOV w/you: 10/21/17 Next appt w/you: None

## 2018-07-21 NOTE — Telephone Encounter (Signed)
Done erx 

## 2018-07-24 NOTE — Telephone Encounter (Signed)
Patient calling to check status. Would like to schedule an appointment as soon as possible. Please advise   Cb#[phone]

## 2018-07-24 NOTE — Telephone Encounter (Signed)
See other message.Marland Kitchen PA is still pending as of 07/24/2018.

## 2018-07-28 NOTE — Telephone Encounter (Signed)
Pt called - insurance is approved. Use the same code as the last shot.  Pt is asking to be scheduled. Pt said this should be free cb is [phone]

## 2018-07-29 ENCOUNTER — Ambulatory Visit (INDEPENDENT_AMBULATORY_CARE_PROVIDER_SITE_OTHER): Payer: 59

## 2018-07-29 DIAGNOSIS — M810 Age-related osteoporosis without current pathological fracture: Secondary | ICD-10-CM

## 2018-07-29 MED ORDER — DENOSUMAB 60 MG/ML ~~LOC~~ SOSY
60.0000 mg | PREFILLED_SYRINGE | Freq: Once | SUBCUTANEOUS | Status: AC
  Administered 2018-07-29: 60 mg via SUBCUTANEOUS

## 2018-07-29 NOTE — Progress Notes (Signed)
Medical screening examination/treatment/procedure(s) were performed by non-physician practitioner and as supervising physician I was immediately available for consultation/collaboration. I agree with above. James John, MD   

## 2018-07-29 NOTE — Telephone Encounter (Signed)
Left message for patient to call back to schedule. NURSE - NURSE - Prolia ($0 copay - okay to give per [facility]) Pt will be driving _____________

## 2018-07-29 NOTE — Telephone Encounter (Signed)
Appointment has been made for 07/29/18.

## 2018-08-03 ENCOUNTER — Other Ambulatory Visit: Payer: Self-pay | Admitting: Internal Medicine

## 2018-08-11 NOTE — Telephone Encounter (Signed)
Pt called and stated that patient received a bill in the mail for 800 for prolia shot. Please advise. Pt would like [place] to call insurance  [phone]

## 2018-08-11 NOTE — Telephone Encounter (Signed)
I have talked with patient's insurance co, they are not requiring any further information to prove the need for prolia injections---her plan supports paying 80% after she meets her deductible, if she meets maximum deductible of $10,000 in one year, then her plan will pay 100%---I have advised patient that's the only information I am allowed to get, if she feels her plan should pay more than that, she will need to call and get information from her insurance company---

## 2018-10-06 ENCOUNTER — Other Ambulatory Visit: Payer: Self-pay | Admitting: Internal Medicine

## 2018-10-07 ENCOUNTER — Ambulatory Visit (INDEPENDENT_AMBULATORY_CARE_PROVIDER_SITE_OTHER): Payer: 59 | Admitting: Internal Medicine

## 2018-10-07 ENCOUNTER — Other Ambulatory Visit: Payer: Self-pay | Admitting: Emergency Medicine

## 2018-10-07 ENCOUNTER — Telehealth: Payer: Self-pay | Admitting: Internal Medicine

## 2018-10-07 DIAGNOSIS — F329 Major depressive disorder, single episode, unspecified: Secondary | ICD-10-CM

## 2018-10-07 DIAGNOSIS — Z20822 Contact with and (suspected) exposure to covid-19: Secondary | ICD-10-CM

## 2018-10-07 DIAGNOSIS — E039 Hypothyroidism, unspecified: Secondary | ICD-10-CM

## 2018-10-07 DIAGNOSIS — J069 Acute upper respiratory infection, unspecified: Secondary | ICD-10-CM

## 2018-10-07 DIAGNOSIS — Z20828 Contact with and (suspected) exposure to other viral communicable diseases: Secondary | ICD-10-CM

## 2018-10-07 DIAGNOSIS — F32A Depression, unspecified: Secondary | ICD-10-CM

## 2018-10-07 MED ORDER — MAGIC MOUTHWASH: 5.0000 mL | Freq: Three times a day (TID) | ORAL | 0 refills | Status: DC | PRN

## 2018-10-07 MED ORDER — AZITHROMYCIN 250 MG PO TABS: ORAL_TABLET | ORAL | 1 refills | Status: DC

## 2018-10-07 MED ORDER — HYDROCODONE-HOMATROPINE 5-1.5 MG/5ML PO SYRP: 5.0000 mL | ORAL_SOLUTION | Freq: Four times a day (QID) | ORAL | 0 refills | Status: AC | PRN

## 2018-10-07 NOTE — Patient Instructions (Signed)
Please take all new medication as prescribed  Please continue all other medications as before, and refills have been done if requested.  Please have the pharmacy call with any other refills you may need.  Please continue your efforts at being more active, low cholesterol diet, and weight control.  Please keep your appointments with your specialists as you may have planned  Please go to the [facility] site for COVID testing

## 2018-10-07 NOTE — Progress Notes (Signed)
Patient ID: [name], female   DOB: March 14, 1960, 58 y.o.   MRN: [id number]  Virtual Visit via Video Note  I connected with [name] on 10/07/18 at 11:00 AM EDT by a video enabled telemedicine application and verified that I am speaking with the correct person using two identifiers.  Location: Patient: at home Provider: at office   I discussed the limitations of evaluation and management by telemedicine and the availability of in person appointments. The patient expressed understanding and agreed to proceed.  History of Present Illness:  Here with 2-3 days acute onset fever, facial pain, pressure, headache, general weakness and malaise, and clearish d/c, with mild ST and cough, but pt denies chest pain, wheezing, increased sob or doe, orthopnea, PND, increased LE swelling, palpitations, dizziness or syncope.  Pt denies new neurological symptoms such as new headache, or facial or extremity weakness or numbness   Pt denies polydipsia, polyuria.  Denies worsening depressive symptoms, suicidal ideation, or panic; has ongoing anxiety.  Denies hyper or hypo thyroid symptoms such as voice, skin or hair change. Past Medical History:  Diagnosis Date  . Allergic rhinitis   . Anemia    history of anemia secondary to fibroid sx  . Anxiety and depression   . Cervical disc disease 07/20/2010  . Cyst of ovary    bilateral  . Foot fracture, left    hx of  . Hyperlipidemia   . Hypothyroidism   . IBS (irritable bowel syndrome)   . Iron deficiency 07/20/2010  . Low back pain   . Lumbar disc disease 07/20/2010  . Osteoporosis 07/20/2010  . Post-operative nausea and vomiting   . 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 Vitamin D deficiency 07/20/2010   Past Surgical History:  Procedure Laterality Date  . COLONOSCOPY    . fibroid sx    . right lateral epicondylar    . TRIGGER FINGER RELEASE  2016  . VAGINAL HYSTERECTOMY    . WRIST FRACTURE SURGERY Left 05/25/2014    reports that she has quit  smoking. She has never used smokeless tobacco. She reports that she does not drink alcohol or use drugs. family history includes Alcohol abuse in her father and mother; Heart attack in her father. Allergies  Allergen Reactions  . Sulfa Antibiotics Other (See Comments)  . Sulfonamide Derivatives Other (See Comments)    Pt unknown reaction    Current Outpatient Medications on File Prior to Visit  Medication Sig Dispense Refill  . acetaminophen (TYLENOL) 500 MG tablet Take 1,000 mg by mouth every 6 (six) hours as needed for mild pain, moderate pain or headache.    . ALPRAZolam (XANAX) 0.5 MG tablet 1 tab by mouth daily as needed 30 tablet 2  . Biotin 1 MG CAPS Take 1 capsule by mouth daily.    Marland Kitchen denosumab (PROLIA) 60 MG/ML SOLN injection Inject 1 Dose into the skin every 6 (six) months.    . docusate sodium (COLACE) 100 MG capsule Take 200 mg by mouth daily.    Marland Kitchen gabapentin (NEURONTIN) 100 MG capsule Take 2 capsules (200 mg total) by mouth at bedtime. 60 capsule 3  . levothyroxine (SYNTHROID) 75 MCG tablet TAKE 1 TABLET BY MOUTH EVERY DAY 90 tablet 3  . meloxicam (MOBIC) 15 MG tablet TAKE 1 TABLET EACH DAY. 30 tablet 2  . naproxen sodium (ALEVE) 220 MG tablet Take 220-440 mg by mouth 2 (two) times daily as needed (PAIN).    Marland Kitchen predniSONE (DELTASONE) 50 MG tablet Take  1 tablet (50 mg total) by mouth daily. 5 tablet 0  . RELPAX 40 MG tablet TAKE 1 TABLET DAILY AS NEEDED FOR MIGRAINE. 6 tablet 0  . traMADol (ULTRAM) 50 MG tablet Take 1 tablet (50 mg total) by mouth every 6 (six) hours as needed. 15 tablet 0  . Vitamin D, Ergocalciferol, (DRISDOL) 1.25 MG (50000 UT) CAPS capsule TAKE ONE CAPSULE ONCE A WEEK. 4 capsule 0  . zolpidem (AMBIEN CR) 6.25 MG CR tablet TAKE 1 TABLET AT BEDTIME AS NEEDED. 90 tablet 0   No current facility-administered medications on file prior to visit.     Observations/Objective: Alert, NAD, mild ill, appropriate mood and affect, resps normal, cn 2-12 intact, moves all  4s, no visible rash or swelling Lab Results  Component Value Date   WBC 4.7 10/16/2017   HGB 12.8 10/16/2017   HCT 37.5 10/16/2017   PLT 172.0 10/16/2017   GLUCOSE 90 10/16/2017   CHOL 187 10/16/2017   TRIG 40.0 10/16/2017   HDL 88.00 10/16/2017   LDLDIRECT 95.5 07/20/2010   LDLCALC 91 10/16/2017   ALT 10 10/16/2017   AST 15 10/16/2017   NA 141 10/16/2017   K 4.1 10/16/2017   CL 106 10/16/2017   CREATININE 0.72 10/16/2017   BUN 31 (H) 10/16/2017   CO2 29 10/16/2017   TSH 2.95 12/19/2017   Assessment and Plan: See notes  Follow Up Instructions: See notes   I discussed the assessment and treatment plan with the patient. The patient was provided an opportunity to ask questions and all were answered. The patient agreed with the plan and demonstrated an understanding of the instructions.   The patient was advised to call back or seek an in-person evaluation if the symptoms worsen or if the condition fails to improve as anticipated.   Carol Cower, MD

## 2018-10-07 NOTE — Telephone Encounter (Signed)
Magic mouth wash was not sent to pharmacy

## 2018-10-07 NOTE — Telephone Encounter (Signed)
Copied from [id number]. Topic: Quick Communication - Rx Refill/Question >> Oct 07, 2018  1:11 PM Izola Price, [place] A wrote: Medication: magic mouthwash SOLN (Patient stated that her mouthwash needs to be sent over to pharmacy. Patient stated that it was left off of meds from appt today.)  Has the patient contacted their pharmacy? {Yes (Agent: If no, request that the patient contact the pharmacy for the refill.) (Agent: If yes, when and what did the pharmacy advise?)Contact PCP  Preferred Pharmacy (with phone number or street name): [facility], [place]. [phone] (Phone) [phone] (Fax)    Agent: Please be advised that RX refills may take up to 3 business days. We ask that you follow-up with your pharmacy.

## 2018-10-07 NOTE — Telephone Encounter (Signed)
This was placed on your desk with a note to be faxed about 1 pm. thanks

## 2018-10-08 LAB — NOVEL CORONAVIRUS, NAA: SARS-CoV-2, NAA: NOT DETECTED

## 2018-10-08 NOTE — Telephone Encounter (Signed)
Was faxed yesterday.

## 2018-10-13 ENCOUNTER — Encounter: Payer: Self-pay | Admitting: Internal Medicine

## 2018-10-13 NOTE — Assessment & Plan Note (Signed)
stable overall by history and exam, recent data reviewed with pt, and pt to continue medical treatment as before,  to f/u any worsening symptoms or concerns  

## 2018-10-13 NOTE — Assessment & Plan Note (Signed)
Mild to mod, c/w viral illness most likley, for COVID testing referral, supportive care, to f/u any worsening symptoms or concerns

## 2018-11-05 ENCOUNTER — Other Ambulatory Visit (INDEPENDENT_AMBULATORY_CARE_PROVIDER_SITE_OTHER): Payer: 59

## 2018-11-05 DIAGNOSIS — Z114 Encounter for screening for human immunodeficiency virus [HIV]: Secondary | ICD-10-CM

## 2018-11-05 DIAGNOSIS — Z0000 Encounter for general adult medical examination without abnormal findings: Secondary | ICD-10-CM

## 2018-11-05 LAB — BASIC METABOLIC PANEL
BUN: 29 mg/dL — ABNORMAL HIGH (ref 6–23)
CO2: 29 mEq/L (ref 19–32)
Calcium: 9.5 mg/dL (ref 8.4–10.5)
Chloride: 104 mEq/L (ref 96–112)
Creatinine, Ser: 0.69 mg/dL (ref 0.40–1.20)
GFR: 87.07 mL/min (ref >60.00)
Glucose, Bld: 90 mg/dL (ref 70–99)
Potassium: 4.2 mEq/L (ref 3.5–5.1)
Sodium: 141 mEq/L (ref 135–145)

## 2018-11-05 LAB — CBC WITH DIFFERENTIAL/PLATELET
Basophils Absolute: 0 10*3/uL (ref 0.0–0.1)
Basophils Relative: 1 % (ref 0.0–3.0)
Eosinophils Absolute: 0.2 10*3/uL (ref 0.0–0.7)
Eosinophils Relative: 4.5 % (ref 0.0–5.0)
HCT: 39.6 % (ref 36.0–46.0)
Hemoglobin: 13.3 g/dL (ref 12.0–15.0)
Lymphocytes Relative: 40.2 % (ref 12.0–46.0)
Lymphs Abs: 1.8 10*3/uL (ref 0.7–4.0)
MCHC: 33.6 g/dL (ref 30.0–36.0)
MCV: 91.6 fl (ref 78.0–100.0)
Monocytes Absolute: 0.5 10*3/uL (ref 0.1–1.0)
Monocytes Relative: 10 % (ref 3.0–12.0)
Neutro Abs: 2 10*3/uL (ref 1.4–7.7)
Neutrophils Relative %: 44.3 % (ref 43.0–77.0)
Platelets: 179 10*3/uL (ref 150.0–400.0)
RBC: 4.32 Mil/uL (ref 3.87–5.11)
RDW: 13.6 % (ref 11.5–15.5)
WBC: 4.5 10*3/uL (ref 4.0–10.5)

## 2018-11-05 LAB — HEPATIC FUNCTION PANEL
ALT: 10 U/L (ref 0–35)
AST: 14 U/L (ref 0–37)
Albumin: 4.5 g/dL (ref 3.5–5.2)
Alkaline Phosphatase: 41 U/L (ref 39–117)
Bilirubin, Direct: 0 mg/dL (ref 0.0–0.3)
Total Bilirubin: 0.5 mg/dL (ref 0.2–1.2)
Total Protein: 7.1 g/dL (ref 6.0–8.3)

## 2018-11-05 LAB — URINALYSIS, ROUTINE W REFLEX MICROSCOPIC
Bilirubin Urine: NEGATIVE
Ketones, ur: NEGATIVE
Leukocytes,Ua: NEGATIVE
Nitrite: NEGATIVE
Specific Gravity, Urine: 1.025 (ref 1.000–1.030)
Total Protein, Urine: NEGATIVE
Urine Glucose: NEGATIVE
Urobilinogen, UA: 0.2 (ref 0.0–1.0)
WBC, UA: NONE SEEN (ref 0–?)
pH: 6 (ref 5.0–8.0)

## 2018-11-05 LAB — LIPID PANEL
Cholesterol: 225 mg/dL — ABNORMAL HIGH (ref 0–200)
HDL: 102.2 mg/dL (ref >39.00)
LDL Cholesterol: 110 mg/dL — ABNORMAL HIGH (ref 0–99)
NonHDL: 122.35
Total CHOL/HDL Ratio: 2
Triglycerides: 63 mg/dL (ref 0.0–149.0)
VLDL: 12.6 mg/dL (ref 0.0–40.0)

## 2018-11-05 LAB — TSH: TSH: 3.64 u[IU]/mL (ref 0.35–4.50)

## 2018-11-06 LAB — HIV ANTIBODY (ROUTINE TESTING W REFLEX): HIV 1&2 Ab, 4th Generation: NONREACTIVE

## 2018-11-07 ENCOUNTER — Other Ambulatory Visit: Payer: Self-pay

## 2018-11-07 ENCOUNTER — Encounter: Payer: Self-pay | Admitting: Internal Medicine

## 2018-11-07 ENCOUNTER — Ambulatory Visit (INDEPENDENT_AMBULATORY_CARE_PROVIDER_SITE_OTHER): Payer: 59 | Admitting: Internal Medicine

## 2018-11-07 VITALS — BP 116/72 | HR 68 | Temp 98.4°F | Ht 67.0 in

## 2018-11-07 DIAGNOSIS — Z23 Encounter for immunization: Secondary | ICD-10-CM | POA: Diagnosis not present

## 2018-11-07 DIAGNOSIS — K219 Gastro-esophageal reflux disease without esophagitis: Secondary | ICD-10-CM | POA: Diagnosis not present

## 2018-11-07 DIAGNOSIS — Z0000 Encounter for general adult medical examination without abnormal findings: Secondary | ICD-10-CM

## 2018-11-07 MED ORDER — ZOSTER VAC RECOMB ADJUVANTED 50 MCG/0.5ML IM SUSR: 0.5000 mL | Freq: Once | INTRAMUSCULAR | 1 refills | Status: AC

## 2018-11-07 MED ORDER — ALPRAZOLAM 0.5 MG PO TABS: ORAL_TABLET | ORAL | 2 refills | Status: DC

## 2018-11-07 MED ORDER — LEVOTHYROXINE SODIUM 75 MCG PO TABS: 75.0000 ug | ORAL_TABLET | Freq: Every day | ORAL | 3 refills | Status: DC

## 2018-11-07 MED ORDER — ZOLPIDEM TARTRATE ER 6.25 MG PO TBCR: 6.2500 mg | EXTENDED_RELEASE_TABLET | Freq: Every evening | ORAL | 1 refills | Status: DC | PRN

## 2018-11-07 NOTE — Assessment & Plan Note (Signed)
stable overall by history and exam, recent data reviewed with pt, and pt to continue medical treatment as before,  to f/u any worsening symptoms or concerns  

## 2018-11-07 NOTE — Patient Instructions (Addendum)
You had the flu shot today  Your shingles shot prescription was sent to the pharmacy  Please continue all other medications as before, and refills have been done if requested.  Please have the pharmacy call with any other refills you may need.  Please continue your efforts at being more active, low cholesterol diet, and weight control.  You are otherwise up to date with prevention measures today.  Please keep your appointments with your specialists as you may have planned  Please return in 1 year for your yearly visit, or sooner if needed, with Lab testing done 3-5 days before

## 2018-11-07 NOTE — Addendum Note (Signed)
Addended by: Biagio Borg on: 11/07/2018 10:21 AM   Modules accepted: Orders

## 2018-11-07 NOTE — Progress Notes (Signed)
Subjective:    Patient ID: [name], female    DOB: 07-20-1960, 58 y.o.   MRN: [id number]  HPI  Here for wellness and f/u;  Overall doing ok;  Pt denies Chest pain, worsening SOB, DOE, wheezing, orthopnea, PND, worsening LE edema, palpitations, dizziness or syncope.  Pt denies neurological change such as new headache, facial or extremity weakness.  Pt denies polydipsia, polyuria, or low sugar symptoms. Pt states overall good compliance with treatment and medications, good tolerability, and has been trying to follow appropriate diet.  Pt denies worsening depressive symptoms, suicidal ideation or panic. No fever, night sweats, wt loss, loss of appetite, or other constitutional symptoms.  Pt states good ability with ADL's, has low fall risk, home safety reviewed and adequate, no other significant changes in hearing or vision, and only occasionally active with exercise.   Recent URI symptoms resolved with antibx except for 3 wks of occasional cough now better.  Saw ENT and felt to have reflux irriation now on prn antacid.  Asks for xanax refill. Asks for depression to be taken off her record  Past Medical History:  Diagnosis Date  . Allergic rhinitis   . Anemia    history of anemia secondary to fibroid sx  . Anxiety and depression   . Cervical disc disease 07/20/2010  . Cyst of ovary    bilateral  . Foot fracture, left    hx of  . Hyperlipidemia   . Hypothyroidism   . IBS (irritable bowel syndrome)   . Iron deficiency 07/20/2010  . Low back pain   . Lumbar disc disease 07/20/2010  . Osteoporosis 07/20/2010  . Post-operative nausea and vomiting   . 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 Vitamin D deficiency 07/20/2010   Past Surgical History:  Procedure Laterality Date  . COLONOSCOPY    . fibroid sx    . right lateral epicondylar    . TRIGGER FINGER RELEASE  2016  . VAGINAL HYSTERECTOMY    . WRIST FRACTURE SURGERY Left 05/25/2014    reports that she has quit smoking. She has never used  smokeless tobacco. She reports that she does not drink alcohol or use drugs. family history includes Alcohol abuse in her father and mother; Heart attack in her father. Allergies  Allergen Reactions  . Sulfa Antibiotics Other (See Comments)  . Sulfonamide Derivatives Other (See Comments)    Pt unknown reaction    Current Outpatient Medications on File Prior to Visit  Medication Sig Dispense Refill  . acetaminophen (TYLENOL) 500 MG tablet Take 1,000 mg by mouth every 6 (six) hours as needed for mild pain, moderate pain or headache.    . denosumab (PROLIA) 60 MG/ML SOLN injection Inject 1 Dose into the skin every 6 (six) months.    . docusate sodium (COLACE) 100 MG capsule Take 200 mg by mouth daily.    Marland Kitchen levothyroxine (SYNTHROID) 75 MCG tablet TAKE 1 TABLET BY MOUTH EVERY DAY 90 tablet 3  . meloxicam (MOBIC) 15 MG tablet TAKE 1 TABLET EACH DAY. 30 tablet 2  . naproxen sodium (ALEVE) 220 MG tablet Take 220-440 mg by mouth 2 (two) times daily as needed (PAIN).    Marland Kitchen RELPAX 40 MG tablet TAKE 1 TABLET DAILY AS NEEDED FOR MIGRAINE. 6 tablet 0  . traMADol (ULTRAM) 50 MG tablet Take 1 tablet (50 mg total) by mouth every 6 (six) hours as needed. 15 tablet 0  . Vitamin D, Ergocalciferol, (DRISDOL) 1.25 MG (50000 UT) CAPS capsule TAKE  ONE CAPSULE ONCE A WEEK. 4 capsule 0  . zolpidem (AMBIEN CR) 6.25 MG CR tablet TAKE 1 TABLET AT BEDTIME AS NEEDED. 90 tablet 0   No current facility-administered medications on file prior to visit.    Review of Systems Constitutional: Negative for other unusual diaphoresis, sweats, appetite or weight changes HENT: Negative for other worsening hearing loss, ear pain, facial swelling, mouth sores or neck stiffness.   Eyes: Negative for other worsening pain, redness or other visual disturbance.  Respiratory: Negative for other stridor or swelling Cardiovascular: Negative for other palpitations or other chest pain  Gastrointestinal: Negative for worsening diarrhea or loose  stools, blood in stool, distention or other pain Genitourinary: Negative for hematuria, flank pain or other change in urine volume.  Musculoskeletal: Negative for myalgias or other joint swelling.  Skin: Negative for other color change, or other wound or worsening drainage.  Neurological: Negative for other syncope or numbness. Hematological: Negative for other adenopathy or swelling Psychiatric/Behavioral: Negative for hallucinations, other worsening agitation, SI, self-injury, or new decreased concentration All otherwise neg per pt     Objective:   Physical Exam BP 116/72   Pulse 68   Temp 98.4 F (36.9 C) (Oral)   Ht 5\' 7"  (1.702 m)   SpO2 98%   BMI 21.14 kg/m  VS noted,  Constitutional: Pt is oriented to person, place, and time. Appears well-developed and well-nourished, in no significant distress and comfortable Head: Normocephalic and atraumatic  Eyes: Conjunctivae and EOM are normal. Pupils are equal, round, and reactive to light Right Ear: External ear normal without discharge Left Ear: External ear normal without discharge Nose: Nose without discharge or deformity Mouth/Throat: Oropharynx is without other ulcerations and moist  Neck: Normal range of motion. Neck supple. No JVD present. No tracheal deviation present or significant neck LA or mass Cardiovascular: Normal rate, regular rhythm, normal heart sounds and intact distal pulses.   Pulmonary/Chest: WOB normal and breath sounds without rales or wheezing  Abdominal: Soft. Bowel sounds are normal. NT. No HSM  Musculoskeletal: Normal range of motion. Exhibits no edema Lymphadenopathy: Has no other cervical adenopathy.  Neurological: Pt is alert and oriented to person, place, and time. Pt has normal reflexes. No cranial nerve deficit. Motor grossly intact, Gait intact Skin: Skin is warm and dry. No rash noted or new ulcerations Psychiatric:  Has normal mood and affect. Behavior is normal without agitation All otherwise  neg per pt Lab Results  Component Value Date   WBC 4.5 11/05/2018   HGB 13.3 11/05/2018   HCT 39.6 11/05/2018   PLT 179.0 11/05/2018   GLUCOSE 90 11/05/2018   CHOL 225 (H) 11/05/2018   TRIG 63.0 11/05/2018   HDL 102.20 11/05/2018   LDLDIRECT 95.5 07/20/2010   LDLCALC 110 (H) 11/05/2018   ALT 10 11/05/2018   AST 14 11/05/2018   NA 141 11/05/2018   K 4.2 11/05/2018   CL 104 11/05/2018   CREATININE 0.69 11/05/2018   BUN 29 (H) 11/05/2018   CO2 29 11/05/2018   TSH 3.64 11/05/2018          Assessment & Plan:

## 2018-11-07 NOTE — Assessment & Plan Note (Signed)

## 2018-11-12 NOTE — Progress Notes (Signed)
[name] [place], [name] [id number] Phone: [phone] Subjective:   I [name] am serving as a Education administrator for Dr. Hulan Saas.  I'm seeing this patient by the request  of:    CC:   [id number]   01/22/2018 Bilateral injections given today.  Discussed with patient in great length.  I am concerned with some of the internal rotation for potential stress reaction.  Patient will have x-rays today.  History of osteoporosis.  Patient has had laboratory work-up that was fairly unremarkable except for vitamin D and patient is taking that on a regular basis.  We discussed the possibility of advanced imaging may be necessary if this pain continues with the amount that she is having.  Seems to be out of proportion.  Patient will follow-up with me again in 3 to 4 weeks  11/10/2018 [name] is a 58 y.o. female coming in with complaint of right hip pain. Hip is painful. Patient would like injection.  Greater trochanteric bursitis on the right side.  Patient states waking her up at night.  Has responded well to injections previously.  Patient was having shoulder pain previously.  Saw another provider.  Told that she did have a rotator cuff tear Patient states that continues to have some discomfort and pain.  Patient was told initially she did have a fracture as well.  Still hurts her at night.  Has done fairly well with exercises.     Past Medical History:  Diagnosis Date  . Allergic rhinitis   . Anemia    history of anemia secondary to fibroid sx  . Anxiety and depression   . Cervical disc disease 07/20/2010  . Cyst of ovary    bilateral  . Foot fracture, left    hx of  . Hyperlipidemia   . Hypothyroidism   . IBS (irritable bowel syndrome)   . Iron deficiency 07/20/2010  . Low back pain   . Lumbar disc disease 07/20/2010  . Osteoporosis 07/20/2010  . Post-operative nausea and vomiting   . 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 Vitamin D  deficiency 07/20/2010   Past Surgical History:  Procedure Laterality Date  . COLONOSCOPY    . fibroid sx    . right lateral epicondylar    . TRIGGER FINGER RELEASE  2016  . VAGINAL HYSTERECTOMY    . WRIST FRACTURE SURGERY Left 05/25/2014   Social History   Socioeconomic History  . Marital status: Married    Spouse name: Not on file  . Number of children: Not on file  . Years of education: Not on file  . Highest education level: Not on file  Occupational History  . Occupation: cna  Social Needs  . Financial resource strain: Not on file  . Food insecurity    Worry: Not on file    Inability: Not on file  . Transportation needs    Medical: Not on file    Non-medical: Not on file  Tobacco Use  . Smoking status: Former Research scientist (life sciences)  . Smokeless tobacco: Never Used  . Tobacco comment: quit 30 years ago  Substance and Sexual Activity  . Alcohol use: No  . Drug use: No  . Sexual activity: Not on file  Lifestyle  . Physical activity    Days per week: Not on file    Minutes per session: Not on file  . Stress: Not on file  Relationships  . Social Herbalist on  phone: Not on file    Gets together: Not on file    Attends religious service: Not on file    Active member of club or organization: Not on file    Attends meetings of clubs or organizations: Not on file    Relationship status: Not on file  Other Topics Concern  . 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         Allergies  Allergen Reactions  . Sulfa Antibiotics Other (See Comments)  . Sulfonamide Derivatives Other (See Comments)    Pt unknown reaction    Family History  Problem Relation Age of Onset  . Alcohol abuse Mother   . Alcohol abuse Father   . Heart attack Father        died suddenly at home with presumed MI  . Colon cancer Neg Hx     Current Outpatient Medications  (Endocrine & Metabolic):  .  denosumab (PROLIA) 60 MG/ML SOLN injection, Inject 1 Dose into the skin every 6 (six) months. .  levothyroxine (SYNTHROID) 75 MCG tablet, Take 1 tablet (75 mcg total) by mouth daily.    Current Outpatient Medications (Analgesics):  .  acetaminophen (TYLENOL) 500 MG tablet, Take 1,000 mg by mouth every 6 (six) hours as needed for mild pain, moderate pain or headache. .  meloxicam (MOBIC) 15 MG tablet, TAKE 1 TABLET EACH DAY. .  naproxen sodium (ALEVE) 220 MG tablet, Take 220-440 mg by mouth 2 (two) times daily as needed (PAIN). Marland Kitchen  RELPAX 40 MG tablet, TAKE 1 TABLET DAILY AS NEEDED FOR MIGRAINE.   Current Outpatient Medications (Other):  Marland Kitchen  ALPRAZolam (XANAX) 0.5 MG tablet, 1 tab by mouth daily as needed .  docusate sodium (COLACE) 100 MG capsule, Take 200 mg by mouth daily. Marland Kitchen  zolpidem (AMBIEN CR) 6.25 MG CR tablet, Take 1 tablet (6.25 mg total) by mouth at bedtime as needed. .  Vitamin D, Ergocalciferol, (DRISDOL) 1.25 MG (50000 UT) CAPS capsule, Take 1 capsule (50,000 Units total) by mouth every 7 (seven) days.    Past medical history, social, surgical and family history all reviewed in electronic medical record.  No pertanent information unless stated regarding to the chief complaint.   Review of Systems:  No headache, visual changes, nausea, vomiting, diarrhea, constipation, dizziness, abdominal pain, skin rash, fevers, chills, night sweats, weight loss, swollen lymph nodes, body aches, joint swelling, chest pain, shortness of breath, mood changes.  Positive muscle aches  Objective  Blood pressure 100/70, pulse (!) 58, height 5\' 7"  (1.702 m), weight 137 lb (62.1 kg), SpO2 97 %.   General: No apparent distress alert and oriented x3 mood and affect normal, dressed appropriately.  HEENT: Pupils equal, extraocular movements intact  Respiratory: Patient's speak in full sentences and does not appear short of breath  Cardiovascular: No lower extremity edema,  non tender, no erythema  Skin: Warm dry intact with no signs of infection or rash on extremities or on axial skeleton.  Abdomen: Soft nontender  Neuro: Cranial nerves II through XII are intact, neurovascularly intact in all extremities with 2+ DTRs and 2+ pulses.  Lymph: No lymphadenopathy of posterior or anterior cervical chain or axillae bilaterally.  Gait normal with good balance and coordination.  MSK:  Non tender with full range of motion and good stability and symmetric strength and tone of shoulders,  elbows, wrist, knee and ankles bilaterally.  Hip: Right ROM IR: 25 Deg, ER: 25 Deg, Flexion: 120 Deg, Extension: 100 Deg, Abduction: 45 Deg, Adduction: 35 Deg Strength IR: 5/5, ER: 5/5, Flexion: 5/5, Extension: 5/5, Abduction: 4/5, Adduction: 5/5 Pelvic alignment unremarkable to inspection and palpation. Standing hip rotation and gait without trendelenburg sign / unsteadiness. Greater trochanter tenderness to palpation over the lateral aspect of the hip No tenderness over piriformis and greater trochanter. Positive Faber No SI joint tenderness and normal minimal SI movement.  Right shoulder exam still shows some mild impingement.  4+ out of 5 strength of the rotator cuff.  Positive crossover noted.    Procedure: Real-time Ultrasound Guided Injection of right greater trochanteric bursitis secondary to patient's body habitus Device: GE Logiq Q7 Ultrasound guided injection is preferred based studies that show increased duration, increased effect, greater accuracy, decreased procedural pain, increased response rate, and decreased cost with ultrasound guided versus blind injection.  Verbal informed consent obtained.  Time-out conducted.  Noted no overlying erythema, induration, or other signs of local infection.  Skin prepped in a sterile fashion.  Local anesthesia: Topical Ethyl chloride.  With sterile technique and under real time ultrasound guidance:  Greater trochanteric area was  visualized and patient's bursa was noted. A 22-gauge 3 inch needle was inserted and 4 cc of 0.5% Marcaine and 1 cc of Kenalog 40 mg/dL was injected. Pictures taken Completed without difficulty  Pain immediately resolved suggesting accurate placement of the medication.  Advised to call if fevers/chills, erythema, induration, drainage, or persistent bleeding.  Images permanently stored and available for review in the ultrasound unit.  Impression: Technically successful ultrasound guided injection.  Procedure: Real-time Ultrasound Guided Injection of right greater trochanteric bursitis secondary to patient's body habitus Device: GE Logiq Q7 Ultrasound guided injection is preferred based studies that show increased duration, increased effect, greater accuracy, decreased procedural pain, increased response rate, and decreased cost with ultrasound guided versus blind injection.  Verbal informed consent obtained.  Time-out conducted.  Noted no overlying erythema, induration, or other signs of local infection.  Skin prepped in a sterile fashion.  Local anesthesia: Topical Ethyl chloride.  With sterile technique and under real time ultrasound guidance:  Greater trochanteric area was visualized and patient's bursa was noted. A 22-gauge 3 inch needle was inserted and 4 cc of 0.5% Marcaine and 1 cc of Kenalog 40 mg/dL was injected. Pictures taken Completed without difficulty  Pain immediately resolved suggesting accurate placement of the medication.  Advised to call if fevers/chills, erythema, induration, drainage, or persistent bleeding.  Images permanently stored and available for review in the ultrasound unit.  Impression: Technically successful ultrasound guided injection.   Impression and Recommendations:     This case required medical decision making of moderate complexity. The above documentation has been reviewed and is accurate and complete Carol Pulley, DO       Note: This dictation  was prepared with Dragon dictation along with smaller phrase technology. Any transcriptional errors that result from this process are unintentional.

## 2018-11-13 ENCOUNTER — Ambulatory Visit (INDEPENDENT_AMBULATORY_CARE_PROVIDER_SITE_OTHER): Payer: 59 | Admitting: Family Medicine

## 2018-11-13 ENCOUNTER — Encounter: Payer: Self-pay | Admitting: Family Medicine

## 2018-11-13 ENCOUNTER — Ambulatory Visit: Payer: Self-pay

## 2018-11-13 ENCOUNTER — Other Ambulatory Visit: Payer: Self-pay

## 2018-11-13 VITALS — BP 100/70 | HR 58 | Ht 67.0 in | Wt 137.0 lb

## 2018-11-13 DIAGNOSIS — S46011A Strain of muscle(s) and tendon(s) of the rotator cuff of right shoulder, initial encounter: Secondary | ICD-10-CM

## 2018-11-13 DIAGNOSIS — G8929 Other chronic pain: Secondary | ICD-10-CM | POA: Diagnosis not present

## 2018-11-13 DIAGNOSIS — M75101 Unspecified rotator cuff tear or rupture of right shoulder, not specified as traumatic: Secondary | ICD-10-CM | POA: Insufficient documentation

## 2018-11-13 DIAGNOSIS — M7061 Trochanteric bursitis, right hip: Secondary | ICD-10-CM | POA: Diagnosis not present

## 2018-11-13 DIAGNOSIS — M25551 Pain in right hip: Secondary | ICD-10-CM

## 2018-11-13 MED ORDER — VITAMIN D (ERGOCALCIFEROL) 1.25 MG (50000 UNIT) PO CAPS: 50000.0000 [IU] | ORAL_CAPSULE | ORAL | 0 refills | Status: DC

## 2018-11-13 NOTE — Assessment & Plan Note (Signed)
Patient was found to have more of a right rotator cuff tear.  Discussed icing regimen and home exercises, discussed which activities to do which wants to avoid.  Patient has done relatively well.  A limited ultrasound was done today and showed the patient still had what appeared to be a cortical defect noted of the tubercle.  Once weekly vitamin D given, home exercises given, follow-up again in 4 to 8 weeks to further evaluate likely ultrasound.

## 2018-11-13 NOTE — Patient Instructions (Addendum)
Good to see you Continue K2 Once weekly vitamin D See me again in 5-6 weeks

## 2018-11-13 NOTE — Assessment & Plan Note (Signed)
Injection given today.  Tolerated the procedure well.  Discussed icing regimen and home exercises, patient hopefully will improve again.  She has had bilateral previously so we will watch and.  Follow-up again in 4 to 8 weeks

## 2018-11-17 ENCOUNTER — Other Ambulatory Visit: Payer: Self-pay | Admitting: Internal Medicine

## 2018-12-25 NOTE — Progress Notes (Signed)
[name] [address] [place], [place] [address] Phone: [phone] Subjective:   [name], am serving as a scribe for Dr. Hulan Saas.  This visit occurred during the SARS-CoV-2 public health emergency.  Safety protocols were in place, including screening questions prior to the visit, additional usage of staff PPE, and extensive cleaning of exam room while observing appropriate contact time as indicated for disinfecting solutions.    CC: Right shoulder, right hip pain  [id number]   11/13/2018 Patient was found to have more of a right rotator cuff tear.  Discussed icing regimen and home exercises, discussed which activities to do which wants to avoid.  Patient has done relatively well.  A limited ultrasound was done today and showed the patient still had what appeared to be a cortical defect noted of the tubercle.  Once weekly vitamin D given, home exercises given, follow-up again in 4 to 8 weeks to further evaluate likely ultrasound.  Injection given today.  Tolerated the procedure well.  Discussed icing regimen and home exercises, patient hopefully will improve again.  She has had bilateral previously so we will watch and.  Follow-up again in 4 to 8 weeks  Update 12/26/2018 [name] is a 58 y.o. female coming in with complaint of right hip pain. Would like to get injection in the [place] in order to not have pain throuhgout the holidays. Has a month or so of relief.   Right shoulder pain is the same as last visit. Pain all day especially at night when she is sleeping. Lifting her grandson increases her pain located in anterior shoulder.       Past Medical History:  Diagnosis Date  . Allergic rhinitis   . Anemia    history of anemia secondary to fibroid sx  . Anxiety and depression   . Cervical disc disease 07/20/2010  . Cyst of ovary    bilateral  . Foot fracture, left    hx of  . Hyperlipidemia   . Hypothyroidism   . IBS  (irritable bowel syndrome)   . Iron deficiency 07/20/2010  . Low back pain   . Lumbar disc disease 07/20/2010  . Osteoporosis 07/20/2010  . Post-operative nausea and vomiting   . 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 Vitamin D deficiency 07/20/2010   Past Surgical History:  Procedure Laterality Date  . COLONOSCOPY    . fibroid sx    . right lateral epicondylar    . TRIGGER FINGER RELEASE  2016  . VAGINAL HYSTERECTOMY    . WRIST FRACTURE SURGERY Left 05/25/2014   Social History   Socioeconomic History  . Marital status: Married    Spouse name: Not on file  . Number of children: Not on file  . Years of education: Not on file  . Highest education level: Not on file  Occupational History  . Occupation: cna  Social Needs  . Financial resource strain: Not on file  . Food insecurity    Worry: Not on file    Inability: Not on file  . Transportation needs    Medical: Not on file    Non-medical: Not on file  Tobacco Use  . Smoking status: Former Research scientist (life sciences)  . Smokeless tobacco: Never Used  . Tobacco comment: quit 30 years ago  Substance and Sexual Activity  . Alcohol use: Calderon  . Drug use: Calderon  . Sexual activity: Not on file  Lifestyle  . Physical activity    Days per  week: Not on file    Minutes per session: Not on file  . Stress: Not on file  Relationships  . Social Herbalist on phone: Not on file    Gets together: Not on file    Attends religious service: Not on file    Active member of club or organization: Not on file    Attends meetings of clubs or organizations: Not on file    Relationship status: Not on file  Other Topics Concern  . 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         Allergies  Allergen Reactions  . Sulfa Antibiotics Other (See Comments)  . Sulfonamide Derivatives Other (See Comments)    Pt unknown reaction    Family  History  Problem Relation Age of Onset  . Alcohol abuse Mother   . Alcohol abuse Father   . Heart attack Father        died suddenly at home with presumed MI  . Colon cancer Neg Hx     Current Outpatient Medications (Endocrine & Metabolic):  .  denosumab (PROLIA) 60 MG/ML SOLN injection, Inject 1 Dose into the skin every 6 (six) months. .  levothyroxine (SYNTHROID) 75 MCG tablet, Take 1 tablet (75 mcg total) by mouth daily.    Current Outpatient Medications (Analgesics):  .  acetaminophen (TYLENOL) 500 MG tablet, Take 1,000 mg by mouth every 6 (six) hours as needed for mild pain, moderate pain or headache. .  meloxicam (MOBIC) 15 MG tablet, TAKE 1 TABLET EACH DAY. .  naproxen sodium (ALEVE) 220 MG tablet, Take 220-440 mg by mouth 2 (two) times daily as needed (PAIN). Marland Kitchen  RELPAX 40 MG tablet, TAKE 1 TABLET DAILY AS NEEDED FOR MIGRAINE.   Current Outpatient Medications (Other):  Marland Kitchen  ALPRAZolam (XANAX) 0.5 MG tablet, 1 tab by mouth daily as needed .  docusate sodium (COLACE) 100 MG capsule, Take 200 mg by mouth daily. .  Vitamin D, Ergocalciferol, (DRISDOL) 1.25 MG (50000 UT) CAPS capsule, Take 1 capsule (50,000 Units total) by mouth every 7 (seven) days. Marland Kitchen  zolpidem (AMBIEN CR) 6.25 MG CR tablet, Take 1 tablet (6.25 mg total) by mouth at bedtime as needed.    Past medical history, social, surgical and family history all reviewed in electronic medical record.  Calderon pertanent information unless stated regarding to the chief complaint.   Review of Systems:  Calderon headache, visual changes, nausea, vomiting, diarrhea, constipation, dizziness, abdominal pain, skin rash, fevers, chills, night sweats, weight loss, swollen lymph nodes, body aches, joint swelling, muscle aches, chest pain, shortness of breath, mood changes.   Objective  There were Calderon vitals taken for this visit. Systems examined below as of    General: Calderon apparent distress alert and oriented x3 mood and affect normal, dressed  appropriately.  HEENT: Pupils equal, extraocular movements intact  Respiratory: Patient's speak in full sentences and does not appear short of breath  Cardiovascular: Calderon lower extremity edema, non tender, Calderon erythema  Skin: Warm dry intact with Calderon signs of infection or rash on extremities or on axial skeleton.  Abdomen: Soft nontender  Neuro: Cranial nerves II through XII are intact, neurovascularly intact in all extremities with 2+ DTRs and 2+ pulses.  Lymph: Calderon lymphadenopathy of posterior or anterior cervical chain or axillae bilaterally.  Gait normal with  good balance and coordination.  MSK:  Non tender with full range of motion and good stability and symmetric strength and tone of  elbows, wrist,  knee and ankles bilaterally.  Shoulder: Right Inspection reveals Calderon abnormalities, atrophy or asymmetry. Palpation is normal with Calderon tenderness over AC joint or bicipital groove. ROM is full in all planes passively. Rotator cuff strength normal throughout. signs of impingement with positive Neer and Hawkin's tests, but negative empty can sign. Speeds and Yergason's tests normal. Calderon labral pathology noted with negative Obrien's, negative clunk and good stability. Normal scapular function observed. Calderon painful arc and Calderon drop arm sign. Calderon apprehension sign Contralateral shoulder unremarkable  Right hip exam does have pain with Corky Sox.  Severely tender to palpation.  Negative straight leg test.   Procedure: Real-time Ultrasound Guided Injection of right greater trochanteric bursitis secondary to patient's body habitus Device: GE Logiq Q7 Ultrasound guided injection is preferred based studies that show increased duration, increased effect, greater accuracy, decreased procedural pain, increased response rate, and decreased cost with ultrasound guided versus blind injection.  Verbal informed consent obtained.  Time-out conducted.  Noted Calderon overlying erythema, induration, or other signs of local  infection.  Skin prepped in a sterile fashion.  Local anesthesia: Topical Ethyl chloride.  With sterile technique and under real time ultrasound guidance:  Greater trochanteric area was visualized and patient's bursa was noted. A 22-gauge 3 inch needle was inserted and 4 cc of 0.5% Marcaine and 1 cc of Kenalog 40 mg/dL was injected. Pictures taken Completed without difficulty  Pain immediately resolved suggesting accurate placement of the medication.  Advised to call if fevers/chills, erythema, induration, drainage, or persistent bleeding.  Images permanently stored and available for review in the ultrasound unit.  Impression: Technically successful ultrasound guided injection.   Procedure: Real-time Ultrasound Guided Injection of right glenohumeral joint Device: GE Logiq E  Ultrasound guided injection is preferred based studies that show increased duration, increased effect, greater accuracy, decreased procedural pain, increased response rate with ultrasound guided versus blind injection.  Verbal informed consent obtained.  Time-out conducted.  Noted Calderon overlying erythema, induration, or other signs of local infection.  Skin prepped in a sterile fashion.  Local anesthesia: Topical Ethyl chloride.  With sterile technique and under real time ultrasound guidance:  Joint visualized.  23g 1  inch needle inserted posterior approach. Pictures taken for needle placement. Patient did have injection of 2 cc of 1% lidocaine, 2 cc of 0.5% Marcaine, and 1.0 cc of Kenalog 40 mg/dL. Completed without difficulty  Pain immediately resolved suggesting accurate placement of the medication.  Advised to call if fevers/chills, erythema, induration, drainage, or persistent bleeding.  Images permanently stored and available for review in the ultrasound unit.  Impression: Technically successful ultrasound guided injection.   Impression and Recommendations:     This case required medical decision making of  moderate complexity. The above documentation has been reviewed and is accurate and complete Lyndal Pulley, DO       Note: This dictation was prepared with Dragon dictation along with smaller phrase technology. Any transcriptional errors that result from this process are unintentional.

## 2018-12-26 ENCOUNTER — Ambulatory Visit: Payer: Self-pay

## 2018-12-26 ENCOUNTER — Encounter: Payer: Self-pay | Admitting: Family Medicine

## 2018-12-26 ENCOUNTER — Other Ambulatory Visit: Payer: Self-pay

## 2018-12-26 ENCOUNTER — Ambulatory Visit (INDEPENDENT_AMBULATORY_CARE_PROVIDER_SITE_OTHER): Payer: 59 | Admitting: Family Medicine

## 2018-12-26 VITALS — BP 112/76 | HR 68 | Ht 67.0 in

## 2018-12-26 DIAGNOSIS — M25551 Pain in right hip: Secondary | ICD-10-CM

## 2018-12-26 DIAGNOSIS — M7061 Trochanteric bursitis, right hip: Secondary | ICD-10-CM

## 2018-12-26 DIAGNOSIS — M7551 Bursitis of right shoulder: Secondary | ICD-10-CM

## 2018-12-26 NOTE — Assessment & Plan Note (Signed)
Patient was given injection today.  Differential includes a cervical radiculopathy.  Patient does have known degenerative disc disease.  Discussed with patient home exercises again.  Last injection was 3 years ago.  Patient has had home exercises encourage her to do it on a more regular exercises and follow-up with me again in 6 to 8 weeks

## 2018-12-26 NOTE — Patient Instructions (Signed)
Keep doing exercises Ice is your friend See me in 8 weeks

## 2018-12-26 NOTE — Assessment & Plan Note (Signed)
Repeat injection given again today.  We discussed that the differential includes lumbar radiculopathy and if worsening we do not need to do advanced imaging.  Patient is in agreement with the plan.  We discussed icing regimen and home exercises.  Patient is going to increase activity slowly.  Declined formal physical therapy.  Follow-up again in 4 to 8 weeks

## 2019-01-16 ENCOUNTER — Other Ambulatory Visit: Payer: Self-pay

## 2019-01-16 DIAGNOSIS — Z20822 Contact with and (suspected) exposure to covid-19: Secondary | ICD-10-CM

## 2019-01-18 LAB — NOVEL CORONAVIRUS, NAA: SARS-CoV-2, NAA: NOT DETECTED

## 2019-02-02 ENCOUNTER — Other Ambulatory Visit: Payer: Self-pay

## 2019-02-02 ENCOUNTER — Ambulatory Visit (INDEPENDENT_AMBULATORY_CARE_PROVIDER_SITE_OTHER): Payer: 59

## 2019-02-02 DIAGNOSIS — M810 Age-related osteoporosis without current pathological fracture: Secondary | ICD-10-CM

## 2019-02-02 MED ORDER — DENOSUMAB 60 MG/ML ~~LOC~~ SOSY
60.0000 mg | PREFILLED_SYRINGE | Freq: Once | SUBCUTANEOUS | Status: AC
  Administered 2019-02-02: 60 mg via SUBCUTANEOUS

## 2019-02-02 NOTE — Progress Notes (Signed)
Medical screening examination/treatment/procedure(s) were performed by non-physician practitioner and as supervising physician I was immediately available for consultation/collaboration. I agree with above. Maxene Byington, MD   

## 2019-02-15 ENCOUNTER — Other Ambulatory Visit: Payer: Self-pay | Admitting: Family Medicine

## 2019-02-20 ENCOUNTER — Ambulatory Visit: Payer: 59 | Admitting: Family Medicine

## 2019-03-01 ENCOUNTER — Other Ambulatory Visit: Payer: Self-pay | Admitting: Internal Medicine

## 2019-03-10 ENCOUNTER — Other Ambulatory Visit: Payer: Self-pay | Admitting: Internal Medicine

## 2019-03-10 NOTE — Telephone Encounter (Signed)
Done erx 

## 2019-03-16 ENCOUNTER — Telehealth: Payer: Self-pay | Admitting: Emergency Medicine

## 2019-03-16 ENCOUNTER — Ambulatory Visit (INDEPENDENT_AMBULATORY_CARE_PROVIDER_SITE_OTHER): Payer: 59 | Admitting: Family

## 2019-03-16 ENCOUNTER — Encounter: Payer: Self-pay | Admitting: Family

## 2019-03-16 ENCOUNTER — Ambulatory Visit: Payer: 59 | Attending: Internal Medicine

## 2019-03-16 DIAGNOSIS — Z20822 Contact with and (suspected) exposure to covid-19: Secondary | ICD-10-CM

## 2019-03-16 DIAGNOSIS — J029 Acute pharyngitis, unspecified: Secondary | ICD-10-CM | POA: Diagnosis not present

## 2019-03-16 DIAGNOSIS — J019 Acute sinusitis, unspecified: Secondary | ICD-10-CM | POA: Diagnosis not present

## 2019-03-16 DIAGNOSIS — H9203 Otalgia, bilateral: Secondary | ICD-10-CM | POA: Diagnosis not present

## 2019-03-16 MED ORDER — AMOXICILLIN-POT CLAVULANATE 875-125 MG PO TABS: 1.0000 | ORAL_TABLET | Freq: Two times a day (BID) | ORAL | 0 refills | Status: AC

## 2019-03-16 MED ORDER — MAGIC MOUTHWASH: 5.0000 mL | Freq: Three times a day (TID) | ORAL | 0 refills | Status: DC | PRN

## 2019-03-16 NOTE — Telephone Encounter (Signed)
Patient was able to pick up magic mouthwash today.

## 2019-03-16 NOTE — Progress Notes (Signed)
[name] is a 59 y.o. female with the following history as recorded in [facility]:  Patient Active Problem List   Diagnosis Date Noted  . Right rotator cuff tear 11/13/2018  . GERD (gastroesophageal reflux disease) 11/07/2018  . Upper respiratory infection 10/07/2018  . Lisfranc's sprain, left, initial encounter 11/12/2017  . RUQ pain 04/25/2017  . Rash 10/20/2016  . Nonallopathic lesion of cervical region 08/30/2016  . Nonallopathic lesion of thoracic region 08/30/2016  . Nonallopathic lesion of rib cage 08/30/2016  . Venous (peripheral) insufficiency 10/25/2015  . 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 Bursitis of right shoulder 06/07/2015  . Left rotator cuff tear 06/07/2015  . Trigger middle finger of right hand 06/07/2015  . Hearing loss 10/07/2014  . Anemia, iron deficiency 02/11/2014  . Acute upper respiratory infection 02/09/2014  . Fatigue 02/09/2014  . Greater trochanteric bursitis 09/09/2013  . Other malaise and fatigue 08/18/2013  . Right hip pain 08/18/2013  . Insomnia 07/20/2010  . Lumbar disc disease 07/20/2010  . Cervical disc disease 07/20/2010  . Vitamin D deficiency 07/20/2010  . Iron deficiency 07/20/2010  . Osteoporosis 07/20/2010  . 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 Preventative health care 07/18/2010  . Palpitations 06/06/2010  . Anal fissure 10/04/2009  . RECTAL BLEEDING 10/04/2009  . NAUSEA 10/04/2009  . BACK PAIN 04/11/2009  . FRACTURE, FOOT 11/12/2008  . HYPERLIPIDEMIA 03/12/2007  . Anxiety state 03/12/2007  . COMMON MIGRAINE 03/12/2007  . ALLERGIC RHINITIS 03/12/2007  . IBS 03/12/2007  . Pain in thoracic spine 03/12/2007  . LOW BACK PAIN 03/12/2007  . PERIPHERAL EDEMA 03/12/2007  . Hypothyroidism 11/29/2006  . Headache(784.0) 11/29/2006    Current Outpatient Medications  Medication Sig Dispense Refill  . acetaminophen (TYLENOL) 500 MG tablet Take 1,000 mg by mouth every 6 (six) hours as needed for mild pain, moderate pain or  headache.    . ALPRAZolam (XANAX) 0.5 MG tablet 1 tab by mouth daily as needed 30 tablet 2  . amoxicillin-clavulanate (AUGMENTIN) 875-125 MG tablet Take 1 tablet by mouth 2 (two) times daily for 10 days. 20 tablet 0  . denosumab (PROLIA) 60 MG/ML SOLN injection Inject 1 Dose into the skin every 6 (six) months.    . docusate sodium (COLACE) 100 MG capsule Take 200 mg by mouth daily.    Marland Kitchen levothyroxine (SYNTHROID) 75 MCG tablet Take 1 tablet (75 mcg total) by mouth daily. 90 tablet 3  . magic mouthwash SOLN Take 5 mLs by mouth 3 (three) times daily as needed for mouth pain. 120 mL 0  . meloxicam (MOBIC) 15 MG tablet TAKE 1 TABLET EACH DAY. 30 tablet 2  . naproxen sodium (ALEVE) 220 MG tablet Take 220-440 mg by mouth 2 (two) times daily as needed (PAIN).    Marland Kitchen RELPAX 40 MG tablet TAKE 1 TABLET DAILY AS NEEDED FOR MIGRAINE. 6 tablet 0  . Vitamin D, Ergocalciferol, (DRISDOL) 1.25 MG (50000 UT) CAPS capsule TAKE ONE CAPSULE ONCE A WEEK. 4 capsule 0  . zolpidem (AMBIEN CR) 6.25 MG CR tablet TAKE 1 TABLET AT BEDTIME AS NEEDED. 90 tablet 1   No current facility-administered medications for this visit.    Allergies: Sulfa antibiotics and Sulfonamide derivatives  Past Medical History:  Diagnosis Date  . Allergic rhinitis   . Anemia    history of anemia secondary to fibroid sx  . Anxiety and depression   . Cervical disc disease 07/20/2010  . Cyst of ovary    bilateral  . Foot  fracture, left    hx of  . Hyperlipidemia   . Hypothyroidism   . IBS (irritable bowel syndrome)   . Iron deficiency 07/20/2010  . Low back pain   . Lumbar disc disease 07/20/2010  . Osteoporosis 07/20/2010  . Post-operative nausea and vomiting   . 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 Vitamin D deficiency 07/20/2010    Past Surgical History:  Procedure Laterality Date  . COLONOSCOPY    . fibroid sx    . right lateral epicondylar    . TRIGGER FINGER RELEASE  2016  . VAGINAL HYSTERECTOMY    . WRIST FRACTURE SURGERY Left  05/25/2014    Family History  Problem Relation Age of Onset  . Alcohol abuse Mother   . Alcohol abuse Father   . Heart attack Father        died suddenly at home with presumed MI  . Colon cancer Neg Hx     Social History   Tobacco Use  . Smoking status: Former Research scientist (life sciences)  . Smokeless tobacco: Never Used  . Tobacco comment: quit 30 years ago  Substance Use Topics  . Alcohol use: No    Subjective:    I connected with [name] on 03/16/19 at  9:40 AM EST by a video enabled telemedicine application and verified that I am speaking with the correct person using two identifiers.   I discussed the limitations of evaluation and management by telemedicine and the availability of in person appointments. The patient expressed understanding and agreed to proceed. Provider in office/ patient is at home; provider and patient are only 2 people on video call.   Patient was seen at [facility] over the weekend; had been feeling sick for the past 1-2 weeks; diagnosed with sinus/ bilateral ear infection; was given Zithromax 500 mg qd x 3 days; not feeling any better; requesting Rx for Magic Mouthwash due to sore throat; not using Flonase due to concerns for nose bleeds; is scheduled for COVID testing later this morning;     Objective:  There were no vitals filed for this visit.  General: Well developed, well nourished, in no acute distress  Head: Normocephalic and atraumatic  Lungs: Respirations unlabored;  Neurologic: Alert and oriented; speech intact; face symmetrical;   Assessment:  1. Acute sinusitis, recurrence not specified, unspecified location   2. Acute ear pain, bilateral   3. Acute sore throat     Plan:  Keep planned COVID test for later today; Rx for Augmentin 875 mg bid x 10 days and Magic Mouthwash as she requests; she is also encouraged to use saline rinse and Claritin D; increase fluids, rest and follow up worse, no better.   No follow-ups on file.  No orders of the  defined types were placed in this encounter.   Requested Prescriptions   Signed Prescriptions Disp Refills  . amoxicillin-clavulanate (AUGMENTIN) 875-125 MG tablet 20 tablet 0    Sig: Take 1 tablet by mouth 2 (two) times daily for 10 days.  . magic mouthwash SOLN 120 mL 0    Sig: Take 5 mLs by mouth 3 (three) times daily as needed for mouth pain.

## 2019-03-16 NOTE — Telephone Encounter (Signed)
    Patient calling requesting magic mouthwash SOLN be sent to [facility], [place] She is at the pharmacy now

## 2019-03-17 LAB — NOVEL CORONAVIRUS, NAA: SARS-CoV-2, NAA: NOT DETECTED

## 2019-03-20 ENCOUNTER — Encounter: Payer: Self-pay | Admitting: Family

## 2019-03-20 ENCOUNTER — Other Ambulatory Visit: Payer: Self-pay | Admitting: Family

## 2019-03-20 MED ORDER — PREDNISONE 20 MG PO TABS: 20.0000 mg | ORAL_TABLET | Freq: Every day | ORAL | 0 refills | Status: DC

## 2019-03-25 ENCOUNTER — Other Ambulatory Visit: Payer: Self-pay | Admitting: Family Medicine

## 2019-04-17 ENCOUNTER — Other Ambulatory Visit: Payer: Self-pay | Admitting: Internal Medicine

## 2019-04-17 ENCOUNTER — Other Ambulatory Visit: Payer: Self-pay | Admitting: Family Medicine

## 2019-04-17 ENCOUNTER — Ambulatory Visit: Payer: 59 | Attending: Internal Medicine

## 2019-04-17 DIAGNOSIS — Z20822 Contact with and (suspected) exposure to covid-19: Secondary | ICD-10-CM

## 2019-04-18 LAB — NOVEL CORONAVIRUS, NAA: SARS-CoV-2, NAA: NOT DETECTED

## 2019-04-21 ENCOUNTER — Other Ambulatory Visit: Payer: Self-pay | Admitting: Family Medicine

## 2019-04-24 ENCOUNTER — Encounter (INDEPENDENT_AMBULATORY_CARE_PROVIDER_SITE_OTHER): Payer: Self-pay | Admitting: Otolaryngology

## 2019-04-24 ENCOUNTER — Other Ambulatory Visit: Payer: Self-pay

## 2019-04-24 ENCOUNTER — Ambulatory Visit (INDEPENDENT_AMBULATORY_CARE_PROVIDER_SITE_OTHER): Payer: 59 | Admitting: Otolaryngology

## 2019-04-24 VITALS — Temp 97.9°F

## 2019-04-24 DIAGNOSIS — H6123 Impacted cerumen, bilateral: Secondary | ICD-10-CM

## 2019-04-24 DIAGNOSIS — H9041 Sensorineural hearing loss, unilateral, right ear, with unrestricted hearing on the contralateral side: Secondary | ICD-10-CM

## 2019-04-24 NOTE — Progress Notes (Signed)
HPI: [name] is a 59 y.o. female who returns today for evaluation of ears.  She has had longstanding hearing loss in her right ear compared to the left.  She has decided to obtain a hearing aid for the right ear and wanted her ears checked and cleaned prior to getting the hearing aid.  Her last hearing test performed here was in 2016 and demonstrated asymmetric right ear sensorineural hearing loss of approximately 56 DB with normal hearing in the left ear with SRT of 10 DB.  She is scheduling another hearing test and possible hearing aid for the right ear.  Past Medical History:  Diagnosis Date  . Allergic rhinitis   . Anemia    history of anemia secondary to fibroid sx  . Anxiety and depression   . Cervical disc disease 07/20/2010  . Cyst of ovary    bilateral  . Foot fracture, left    hx of  . Hyperlipidemia   . Hypothyroidism   . IBS (irritable bowel syndrome)   . Iron deficiency 07/20/2010  . Low back pain   . Lumbar disc disease 07/20/2010  . Osteoporosis 07/20/2010  . Post-operative nausea and vomiting   . 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 Vitamin D deficiency 07/20/2010   Past Surgical History:  Procedure Laterality Date  . COLONOSCOPY    . fibroid sx    . right lateral epicondylar    . TRIGGER FINGER RELEASE  2016  . VAGINAL HYSTERECTOMY    . WRIST FRACTURE SURGERY Left 05/25/2014   Social History   Socioeconomic History  . Marital status: Married    Spouse name: Not on file  . Number of children: Not on file  . Years of education: Not on file  . Highest education level: Not on file  Occupational History  . Occupation: cna  Tobacco Use  . Smoking status: Never Smoker  . Smokeless tobacco: Never Used  . Tobacco comment: quit 30 years ago  Substance and Sexual Activity  . Alcohol use: No  . Drug use: No  . Sexual activity: Not on file  Other Topics Concern  . 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         Social Determinants of Health   Financial Resource Strain:   . Difficulty of Paying Living Expenses:   Food Insecurity:   . Worried About Charity fundraiser in the Last Year:   . Arboriculturist in the Last Year:   Transportation Needs:   . Film/video editor (Medical):   Marland Kitchen Lack of Transportation (Non-Medical):   Physical Activity:   . Days of Exercise per Week:   . Minutes of Exercise per Session:   Stress:   . Feeling of Stress :   Social Connections:   . Frequency of Communication with Friends and Family:   . Frequency of Social Gatherings with Friends and Family:   . Attends Religious Services:   . Active Member of Clubs or Organizations:   . Attends Archivist Meetings:   Marland Kitchen Marital Status:    Family History  Problem Relation Age of Onset  . Alcohol abuse Mother   . Alcohol abuse Father   . Heart attack Father        died suddenly at home with presumed MI  . Colon cancer Neg  Hx    Allergies  Allergen Reactions  . Sulfa Antibiotics Other (See Comments)  . Sulfonamide Derivatives Other (See Comments)    Pt unknown reaction    Prior to Admission medications   Medication Sig Start Date End Date Taking? Authorizing Provider  acetaminophen (TYLENOL) 500 MG tablet Take 1,000 mg by mouth every 6 (six) hours as needed for mild pain, moderate pain or headache.   Yes [provider]  ALPRAZolam Duanne Moron) 0.5 MG tablet 1 tab by mouth daily as needed 11/07/18  Yes Biagio Borg, MD  denosumab (PROLIA) 60 MG/ML SOLN injection Inject 1 Dose into the skin every 6 (six) months.   Yes [provider]  docusate sodium (COLACE) 100 MG capsule Take 200 mg by mouth daily.   Yes [provider]  levothyroxine (SYNTHROID) 75 MCG tablet Take 1 tablet (75 mcg total) by mouth daily. 11/07/18  Yes Biagio Borg, MD  magic mouthwash SOLN Take 5 mLs by mouth 3 (three) times daily  as needed for mouth pain. 03/16/19  Yes Marrian Salvage, FNP  meloxicam (MOBIC) 15 MG tablet TAKE 1 TABLET EACH DAY. 04/17/19  Yes Biagio Borg, MD  naproxen sodium (ALEVE) 220 MG tablet Take 220-440 mg by mouth 2 (two) times daily as needed (PAIN).   Yes [provider]  predniSONE (DELTASONE) 20 MG tablet Take 1 tablet (20 mg total) by mouth daily with breakfast. 03/20/19  Yes Marrian Salvage, FNP  RELPAX 40 MG tablet TAKE 1 TABLET DAILY AS NEEDED FOR MIGRAINE. 03/01/19  Yes Biagio Borg, MD  Vitamin D, Ergocalciferol, (DRISDOL) 1.25 MG (50000 UNIT) CAPS capsule TAKE ONE CAPSULE ONCE A WEEK. 04/21/19  Yes Hulan Saas M, DO  zolpidem (AMBIEN CR) 6.25 MG CR tablet TAKE 1 TABLET AT BEDTIME AS NEEDED. 03/10/19  Yes Biagio Borg, MD     Positive ROS: No dizziness or vertigo problems.  All other systems have been reviewed and were otherwise negative with the exception of those mentioned in the HPI and as above.  Physical Exam: Constitutional: Alert, well-appearing, no acute distress Ears: External ears without lesions or tenderness.  Mild wax buildup in both ears that was cleaned with curettes.  TMs were otherwise clear. Nasal: External nose without lesions. Septum midline with clear nasal passages bilaterally. Oral: Lips and gums without lesions. Tongue and palate mucosa without lesions. Posterior oropharynx clear. Neck: No palpable adenopathy or masses Respiratory: Breathing comfortably  Skin: No facial/neck lesions or rash noted.  Cerumen impaction removal  Date/Time: 04/24/2019 5:15 PM Performed by: Rozetta Nunnery, MD Authorized by: Rozetta Nunnery, MD   Consent:    Consent obtained:  Verbal   Consent given by:  Patient   Risks discussed:  Pain and bleeding Procedure details:    Location:  L ear and R ear   Procedure type: curette and forceps   Post-procedure details:    Inspection:  TM intact and canal normal   Hearing quality:  Improved    Patient tolerance of procedure:  Tolerated well, no immediate complications Comments:     TMs are clear bilaterally    Assessment: Right ear sensorineural hearing loss. If she has progressive right ear SNHL she might warrant MRI scan to rule out retrocochlear pathology although she has had no headaches or vertigo problems.  Plan: She will follow-up if she has significant drop in her hearing on the right side.   Radene Journey, MD

## 2019-04-28 ENCOUNTER — Ambulatory Visit (INDEPENDENT_AMBULATORY_CARE_PROVIDER_SITE_OTHER): Payer: 59 | Admitting: Otolaryngology

## 2019-05-04 ENCOUNTER — Encounter (INDEPENDENT_AMBULATORY_CARE_PROVIDER_SITE_OTHER): Payer: Self-pay

## 2019-05-04 ENCOUNTER — Ambulatory Visit: Payer: 59 | Attending: Internal Medicine

## 2019-05-04 DIAGNOSIS — Z23 Encounter for immunization: Secondary | ICD-10-CM

## 2019-05-04 NOTE — Progress Notes (Signed)
   Covid-19 Vaccination Clinic  Name:  UNIKA HOLDCROFT    MRN: [id number] DOB: 11-30-1960  05/04/2019  Ms. Penman was observed post Covid-19 immunization for 15 minutes without incident. She was provided with Vaccine Information Sheet and instruction to access the V-Safe system.   Ms. Savacool was instructed to call 911 with any severe reactions post vaccine: Marland Kitchen Difficulty breathing  . Swelling of face and throat  . A fast heartbeat  . A bad rash all over body  . Dizziness and weakness   Immunizations Administered    Name Date Dose VIS Date Route   Pfizer COVID-19 Vaccine 05/04/2019 10:30 AM 0.3 mL 01/16/2019 Intramuscular   Manufacturer: [place]   Lot: [id number]   [place]: [id number]

## 2019-05-13 ENCOUNTER — Ambulatory Visit (INDEPENDENT_AMBULATORY_CARE_PROVIDER_SITE_OTHER): Payer: 59 | Admitting: Otolaryngology

## 2019-05-27 ENCOUNTER — Ambulatory Visit: Payer: 59 | Attending: Internal Medicine

## 2019-05-27 DIAGNOSIS — Z23 Encounter for immunization: Secondary | ICD-10-CM

## 2019-05-27 NOTE — Progress Notes (Signed)
   Covid-19 Vaccination Clinic  Name:  RATEEL PLINE    MRN: [id number] DOB: Sep 29, 1960  05/27/2019  Ms. Wiles was observed post Covid-19 immunization for 15 minutes without incident. She was provided with Vaccine Information Sheet and instruction to access the V-Safe system.   Ms. Mccraney was instructed to call 911 with any severe reactions post vaccine: Marland Kitchen Difficulty breathing  . Swelling of face and throat  . A fast heartbeat  . A bad rash all over body  . Dizziness and weakness   Immunizations Administered    Name Date Dose VIS Date Route   Pfizer COVID-19 Vaccine 05/27/2019  9:07 AM 0.3 mL 04/01/2018 Intramuscular   Manufacturer: [place]   Lot: [id number]   [place]: [id number]

## 2019-06-09 ENCOUNTER — Encounter (INDEPENDENT_AMBULATORY_CARE_PROVIDER_SITE_OTHER): Payer: Self-pay

## 2019-06-09 ENCOUNTER — Other Ambulatory Visit: Payer: Self-pay | Admitting: Family Medicine

## 2019-07-01 ENCOUNTER — Ambulatory Visit: Payer: 59

## 2019-07-01 ENCOUNTER — Ambulatory Visit (INDEPENDENT_AMBULATORY_CARE_PROVIDER_SITE_OTHER): Payer: 59

## 2019-07-01 ENCOUNTER — Other Ambulatory Visit: Payer: Self-pay

## 2019-07-01 ENCOUNTER — Telehealth: Payer: Self-pay | Admitting: Family Medicine

## 2019-07-01 ENCOUNTER — Ambulatory Visit (INDEPENDENT_AMBULATORY_CARE_PROVIDER_SITE_OTHER): Payer: 59 | Admitting: Family Medicine

## 2019-07-01 ENCOUNTER — Encounter: Payer: Self-pay | Admitting: Family Medicine

## 2019-07-01 VITALS — BP 124/78 | HR 81 | Ht 67.0 in | Wt 135.0 lb

## 2019-07-01 DIAGNOSIS — M7061 Trochanteric bursitis, right hip: Secondary | ICD-10-CM | POA: Diagnosis not present

## 2019-07-01 DIAGNOSIS — S6991XA Unspecified injury of right wrist, hand and finger(s), initial encounter: Secondary | ICD-10-CM

## 2019-07-01 DIAGNOSIS — M1712 Unilateral primary osteoarthritis, left knee: Secondary | ICD-10-CM | POA: Insufficient documentation

## 2019-07-01 DIAGNOSIS — M25531 Pain in right wrist: Secondary | ICD-10-CM | POA: Diagnosis not present

## 2019-07-01 DIAGNOSIS — M25562 Pain in left knee: Secondary | ICD-10-CM | POA: Diagnosis not present

## 2019-07-01 DIAGNOSIS — G8929 Other chronic pain: Secondary | ICD-10-CM

## 2019-07-01 MED ORDER — TRAMADOL HCL 50 MG PO TABS: 50.0000 mg | ORAL_TABLET | Freq: Two times a day (BID) | ORAL | 0 refills | Status: AC | PRN

## 2019-07-01 NOTE — Telephone Encounter (Signed)
I would try the anti-inflammatories first, the tramadol may not do well with her stomach and likely will not help the pain as much with most of it seeming to be from inflammation

## 2019-07-01 NOTE — Assessment & Plan Note (Signed)
Repeat injection given today, tolerated the procedure well, discussed icing regimen and home exercises.  Discussed core stability and hip abductor strengthening.  Meloxicam for breakthrough pain.  This is a chronic condition with exacerbation.

## 2019-07-01 NOTE — Telephone Encounter (Signed)
Spoke with [name], given Dr. Thompson Caul response. She regularly takes anti inflammatories, although they cause her stomach upset. She used the tramadol with her shoulder injury last year and really felt some relief, would like to try again for the wrist.

## 2019-07-01 NOTE — Assessment & Plan Note (Signed)
Injection given today, tolerated the procedure well, discussed icing regimen and home exercises.  We will consider the possibility of bracing if necessary.  Continue pain repeat imaging will be necessary.

## 2019-07-01 NOTE — Progress Notes (Signed)
[facility] [address] [place] Phone: [phone] Subjective:   I [name] am serving as a Education administrator for Dr. Hulan Saas.  This visit occurred during the SARS-CoV-2 public health emergency.  Safety protocols were in place, including screening questions prior to the visit, additional usage of staff PPE, and extensive cleaning of exam room while observing appropriate contact time as indicated for disinfecting solutions.   I'm seeing this patient by the request  of:  Biagio Borg, MD  CC: Wrist pain, knee pain, hip pain  [id number]   12/26/2018 Repeat injection given again today.  We discussed that the differential includes lumbar radiculopathy and if worsening we do not need to do advanced imaging.  Patient is in agreement with the plan.  We discussed icing regimen and home exercises.  Patient is going to increase activity slowly.  Declined formal physical therapy.  Follow-up again in 4 to 8 weeks  Patient was given injection today.  Differential includes a cervical radiculopathy.  Patient does have known degenerative disc disease.  Discussed with patient home exercises again.  Last injection was 3 years ago.  Patient has had home exercises encourage her to do it on a more regular exercises and follow-up with me again in 6 to 8 weeks  07/01/2019 [name] is a 59 y.o. female coming in with complaint of right wrist pain. Dog pulled her causing a wrist injury. Would like left knee and right hip injections today.   Onset- Yesterday  Location - ulna  Duration-  Character- sore, achy  Aggravating factors- pronation and supination, extension  Reliving factors-  Therapies tried- ice, aleve, tylenol  Severity- 4/10 at its worse    Patient feels like this is a good aggravated her hip bursitis.  Last injection was 6 months ago.  Patient is leaving for the beach and is wanting to avoid too much discomfort and pain and is wondering  if another injection is possible.  Past Medical History:  Diagnosis Date  . Allergic rhinitis   . Anemia    history of anemia secondary to fibroid sx  . Anxiety and depression   . Cervical disc disease 07/20/2010  . Cyst of ovary    bilateral  . Foot fracture, left    hx of  . Hyperlipidemia   . Hypothyroidism   . IBS (irritable bowel syndrome)   . Iron deficiency 07/20/2010  . Low back pain   . Lumbar disc disease 07/20/2010  . Osteoporosis 07/20/2010  . Post-operative nausea and vomiting   . 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 Vitamin D deficiency 07/20/2010   Past Surgical History:  Procedure Laterality Date  . COLONOSCOPY    . fibroid sx    . right lateral epicondylar    . TRIGGER FINGER RELEASE  2016  . VAGINAL HYSTERECTOMY    . WRIST FRACTURE SURGERY Left 05/25/2014   Social History   Socioeconomic History  . Marital status: Married    Spouse name: Not on file  . Number of children: Not on file  . Years of education: Not on file  . Highest education level: Not on file  Occupational History  . Occupation: cna  Tobacco Use  . Smoking status: Never Smoker  . Smokeless tobacco: Never Used  . Tobacco comment: quit 30 years ago  Substance and Sexual Activity  . Alcohol use: No  . Drug use: No  . Sexual activity: Not on file  Other Topics Concern  .  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         Social Determinants of Health   Financial Resource Strain:   . Difficulty of Paying Living Expenses:   Food Insecurity:   . Worried About Charity fundraiser in the Last Year:   . Arboriculturist in the Last Year:   Transportation Needs:   . Film/video editor (Medical):   Marland Kitchen Lack of Transportation (Non-Medical):   Physical Activity:   . Days of Exercise per Week:   . Minutes of Exercise per Session:   Stress:   . Feeling of Stress :     Social Connections:   . Frequency of Communication with Friends and Family:   . Frequency of Social Gatherings with Friends and Family:   . Attends Religious Services:   . Active Member of Clubs or Organizations:   . Attends Archivist Meetings:   Marland Kitchen Marital Status:    Allergies  Allergen Reactions  . Sulfa Antibiotics Other (See Comments)  . Sulfonamide Derivatives Other (See Comments)    Pt unknown reaction    Family History  Problem Relation Age of Onset  . Alcohol abuse Mother   . Alcohol abuse Father   . Heart attack Father        died suddenly at home with presumed MI  . Colon cancer Neg Hx     Current Outpatient Medications (Endocrine & Metabolic):  .  denosumab (PROLIA) 60 MG/ML SOLN injection, Inject 1 Dose into the skin every 6 (six) months. .  levothyroxine (SYNTHROID) 75 MCG tablet, Take 1 tablet (75 mcg total) by mouth daily. .  predniSONE (DELTASONE) 20 MG tablet, Take 1 tablet (20 mg total) by mouth daily with breakfast.   Current Outpatient Medications (Respiratory):  .  magic mouthwash SOLN*, Take 5 mLs by mouth 3 (three) times daily as needed for mouth pain.  Current Outpatient Medications (Analgesics):  .  acetaminophen (TYLENOL) 500 MG tablet, Take 1,000 mg by mouth every 6 (six) hours as needed for mild pain, moderate pain or headache. .  meloxicam (MOBIC) 15 MG tablet, TAKE 1 TABLET EACH DAY. .  naproxen sodium (ALEVE) 220 MG tablet, Take 220-440 mg by mouth 2 (two) times daily as needed (PAIN). Marland Kitchen  RELPAX 40 MG tablet, TAKE 1 TABLET DAILY AS NEEDED FOR MIGRAINE.   Current Outpatient Medications (Other):  Marland Kitchen  ALPRAZolam (XANAX) 0.5 MG tablet, 1 tab by mouth daily as needed .  docusate sodium (COLACE) 100 MG capsule, Take 200 mg by mouth daily. .  magic mouthwash SOLN*, Take 5 mLs by mouth 3 (three) times daily as needed for mouth pain. .  Vitamin D, Ergocalciferol, (DRISDOL) 1.25 MG (50000 UNIT) CAPS capsule, TAKE ONE CAPSULE ONCE A WEEK. Marland Kitchen   zolpidem (AMBIEN CR) 6.25 MG CR tablet, TAKE 1 TABLET AT BEDTIME AS NEEDED. * These medications belong to multiple therapeutic classes and are listed under each applicable group.   Reviewed prior external information including notes and imaging from  primary care provider As well as notes that were available from care everywhere and other healthcare systems.  Past medical history, social, surgical and family history all reviewed in electronic medical record.  No pertanent information unless stated regarding to the chief complaint.   Review of Systems:  No headache, visual changes, nausea, vomiting,  diarrhea, constipation, dizziness, abdominal pain, skin rash, fevers, chills, night sweats, weight loss, swollen lymph nodes, body aches, joint swelling, chest pain, shortness of breath, mood changes. POSITIVE muscle aches  Objective  Blood pressure 124/78, pulse 81, height 5\' 7"  (1.702 m), weight 135 lb (61.2 kg), SpO2 97 %.   General: No apparent distress alert and oriented x3 mood and affect normal, dressed appropriately.  HEENT: Pupils equal, extraocular movements intact  Respiratory: Patient's speak in full sentences and does not appear short of breath  Cardiovascular: No lower extremity edema, non tender, no erythema  Neuro: Cranial nerves II through XII are intact, neurovascularly intact in all extremities with 2+ DTRs and 2+ pulses.  Gait normal with good balance and coordination.  MSK:   Right wrist exam shows the patient does have some swelling and some mild loss of range of motion.  No instability though with movement but voluntary guarding noted.  Patient is tender to palpation diffusely of the wrist but more on the dorsal aspect and the TFCC.  Neurovascularly intact distally.  No pain over the anatomical snuff box  Right hip severe tenderness over the right greater trochanteric area.  Negative straight leg test.  Near full range of motion may be lacking the last 5 degrees of internal  rotation of the hip  Left knee lateral tracking of the patella noted.  Positive patellar grind.  No significant instability with the rest of the knee but lacks last 5 degrees of extension.  Limited musculoskeletal ultrasound was performed and interpreted by Lyndal Pulley  Limited ultrasound of patient's right wrist shows that there is a acute injury to the TFCC noted with mild displacement noted.  Patient though cortical irregularity   Procedure: Real-time Ultrasound Guided Injection of right greater trochanteric bursitis secondary to patient's body habitus Device: GE Logiq Q7 Ultrasound guided injection is preferred based studies that show increased duration, increased effect, greater accuracy, decreased procedural pain, increased response rate, and decreased cost with ultrasound guided versus blind injection.  Verbal informed consent obtained.  Time-out conducted.  Noted no overlying erythema, induration, or other signs of local infection.  Skin prepped in a sterile fashion.  Local anesthesia: Topical Ethyl chloride.  With sterile technique and under real time ultrasound guidance:  Greater trochanteric area was visualized and patient's bursa was noted. A 22-gauge 3 inch needle was inserted and 4 cc of 0.5% Marcaine and 1 cc of Kenalog 40 mg/dL was injected. Pictures taken Completed without difficulty  Pain immediately resolved suggesting accurate placement of the medication.  Advised to call if fevers/chills, erythema, induration, drainage, or persistent bleeding.  Images permanently stored and available for review in the ultrasound unit.  Impression: Technically successful ultrasound guided injection.  After informed written and verbal consent, patient was seated on exam table. Left knee was prepped with alcohol swab and utilizing anterolateral approach, patient's left knee space was injected with 4:1  marcaine 0.5%: Kenalog 40mg /dL. Patient tolerated the procedure well without immediate  complications.   Impression and Recommendations:     This case required medical decision making of moderate complexity. The above documentation has been reviewed and is accurate and complete Lyndal Pulley, DO       Note: This dictation was prepared with Dragon dictation along with smaller phrase technology. Any transcriptional errors that result from this process are unintentional.

## 2019-07-01 NOTE — Telephone Encounter (Signed)
Patient called asking if Dr Tamala Julian thinks Tramadol would be a good option to help with her wrist pain? Please advise.

## 2019-07-01 NOTE — Telephone Encounter (Signed)
Controlled substance, tell her 1/2 pill up to 2 times a day.  No refills.

## 2019-07-01 NOTE — Telephone Encounter (Signed)
Pt informed and expressed understanding. 

## 2019-07-01 NOTE — Patient Instructions (Signed)
See me again in 2-3 weeks

## 2019-07-01 NOTE — Assessment & Plan Note (Signed)
Appears to be at least some TFCC injury noted.  Discussed topical anti-inflammatories, icing regimen, patient is traveling and we will put patient in Exos splint.  Discussed which activities to doing which wants to avoid.  Meloxicam for breakthrough pain.  Follow-up again in 2 to 3 weeks potentially for the review of potentially repeat x-rays

## 2019-07-09 ENCOUNTER — Other Ambulatory Visit: Payer: Self-pay | Admitting: Family Medicine

## 2019-08-14 ENCOUNTER — Other Ambulatory Visit: Payer: Self-pay | Admitting: Family Medicine

## 2019-08-18 ENCOUNTER — Other Ambulatory Visit: Payer: Self-pay | Admitting: Internal Medicine

## 2019-08-31 ENCOUNTER — Other Ambulatory Visit: Payer: Self-pay

## 2019-08-31 ENCOUNTER — Ambulatory Visit (INDEPENDENT_AMBULATORY_CARE_PROVIDER_SITE_OTHER): Payer: 59 | Admitting: *Deleted

## 2019-08-31 DIAGNOSIS — M810 Age-related osteoporosis without current pathological fracture: Secondary | ICD-10-CM | POA: Diagnosis not present

## 2019-08-31 MED ORDER — DENOSUMAB 60 MG/ML ~~LOC~~ SOSY
60.0000 mg | PREFILLED_SYRINGE | Freq: Once | SUBCUTANEOUS | Status: AC
  Administered 2019-08-31: 60 mg via SUBCUTANEOUS

## 2019-08-31 NOTE — Progress Notes (Signed)
Pls cosign for B12 inj../lmb  

## 2019-09-24 ENCOUNTER — Other Ambulatory Visit: Payer: Self-pay | Admitting: Family Medicine

## 2019-09-27 IMAGING — US US ABDOMEN LIMITED
1 series · 14 of 25 positions shown · non-contrast
Comparison: None.

CLINICAL DATA: Right upper quadrant pain

EXAM:
ULTRASOUND ABDOMEN LIMITED RIGHT UPPER QUADRANT

[Series 1: us abdomen limited · 0.17mm/px · 14 of 66 slices shown]
[im 1/66]
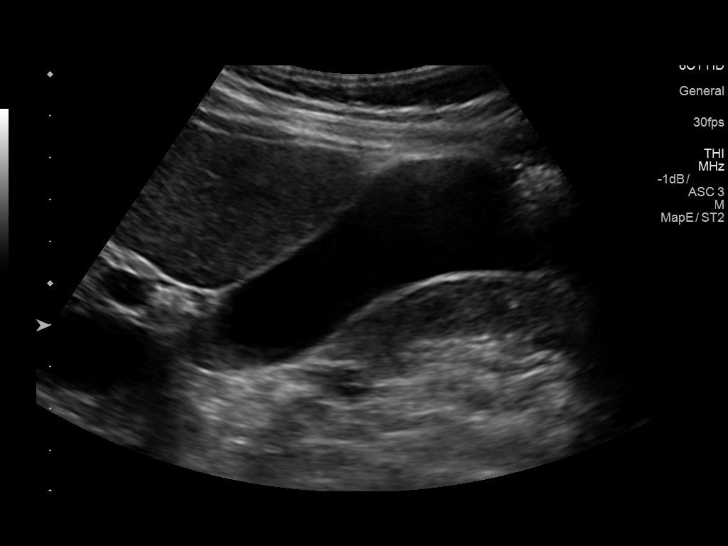
[im 6/66]
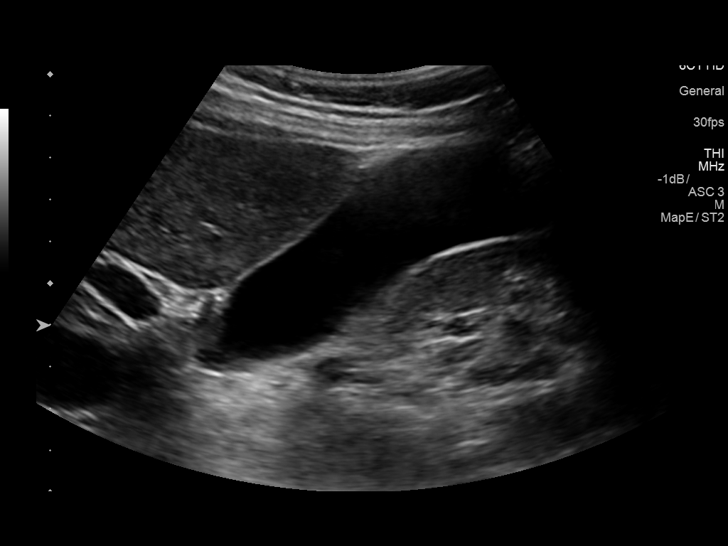
[im 11/66]
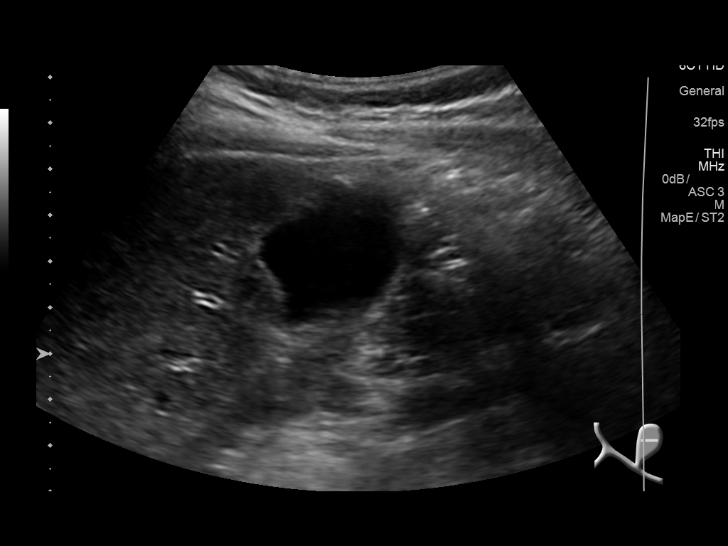
[im 17/66]
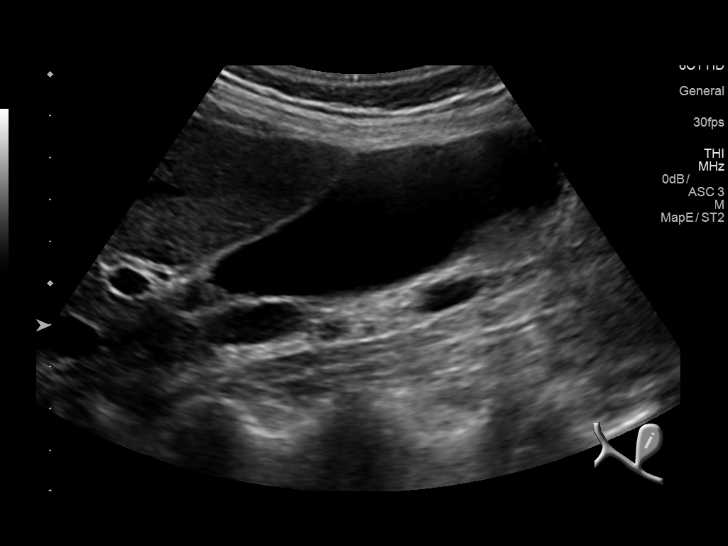
[im 22/66]
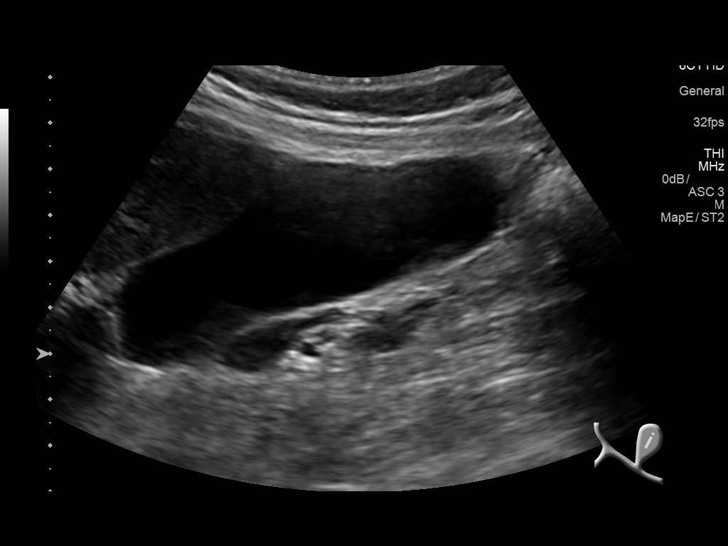
[im 25/66]
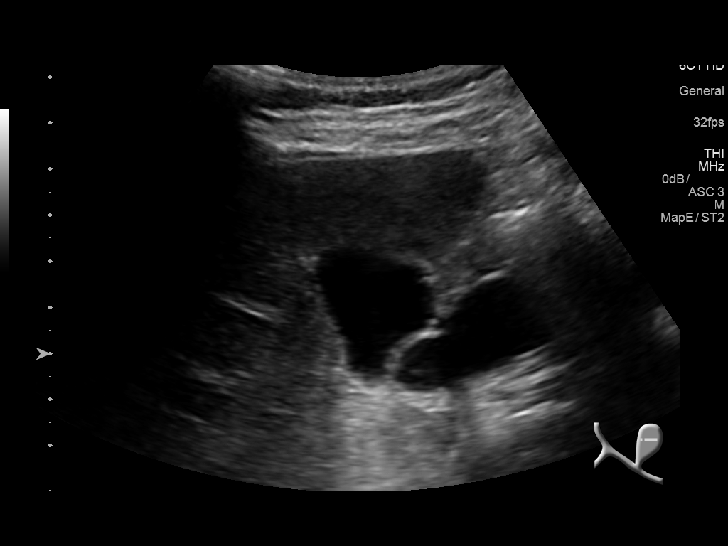
[im 30/66]
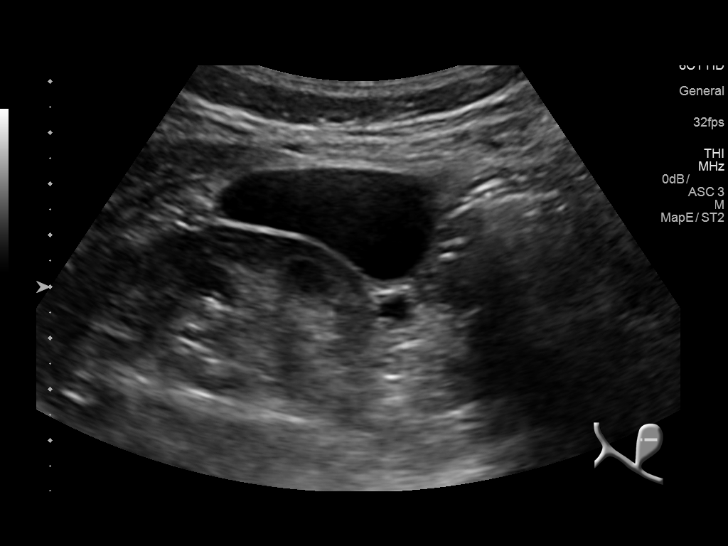
[im 36/66]
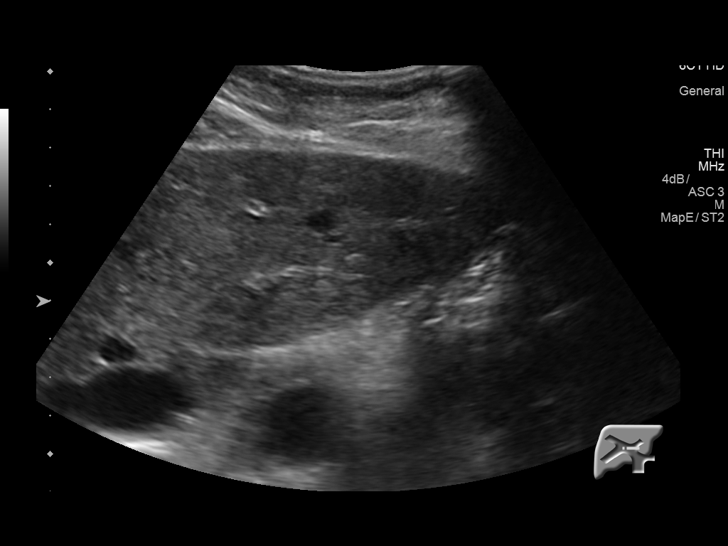
[im 41/66]
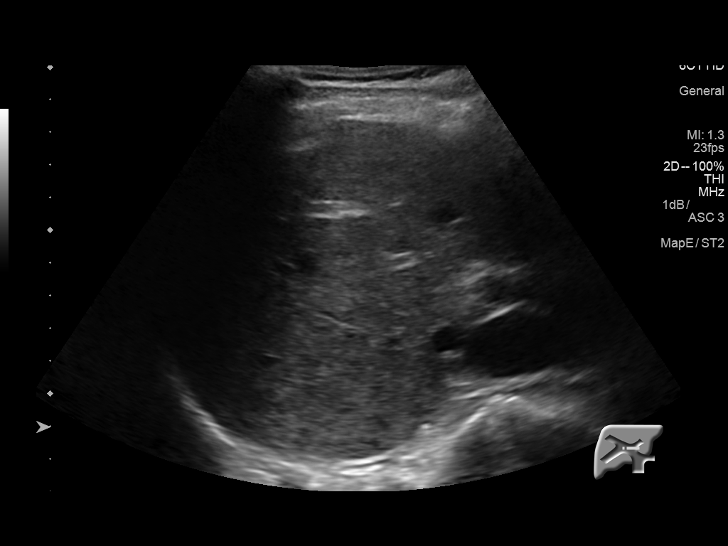
[im 44/66]
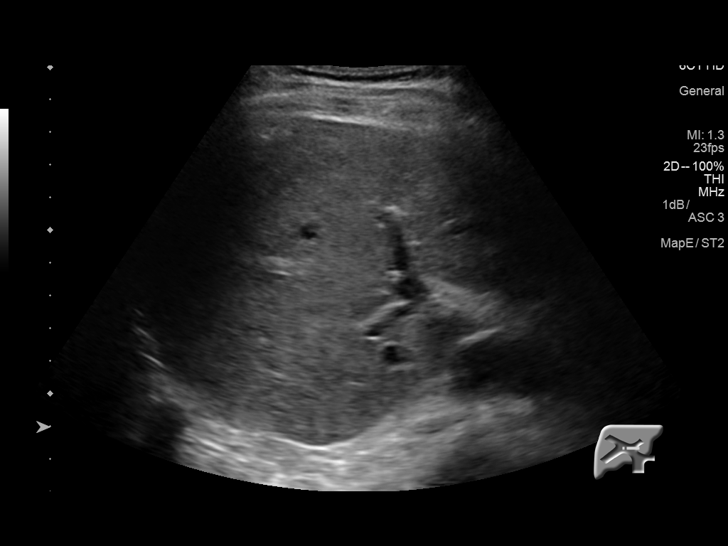
[im 49/66]
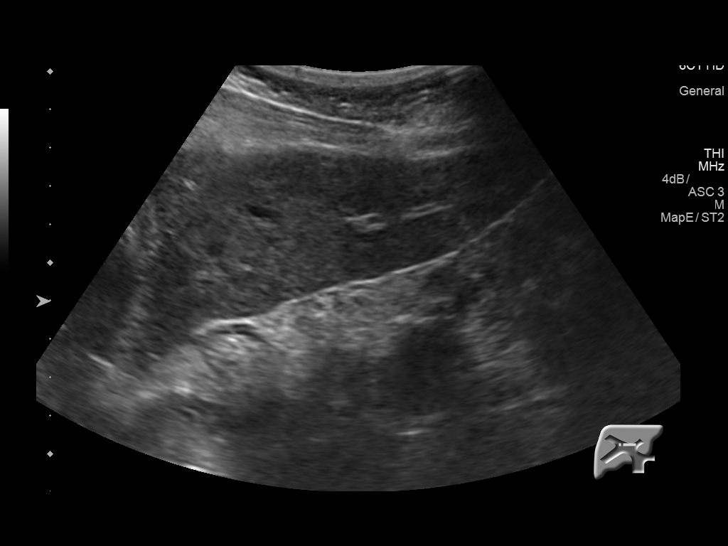
[im 55/66]
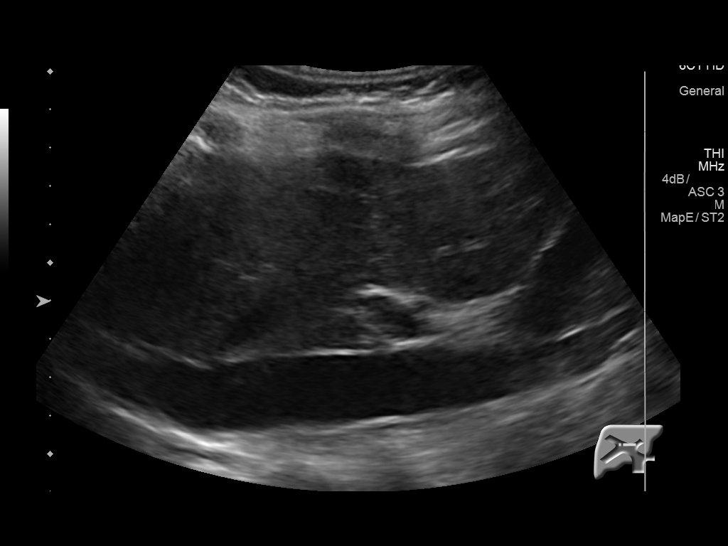
[im 60/66]
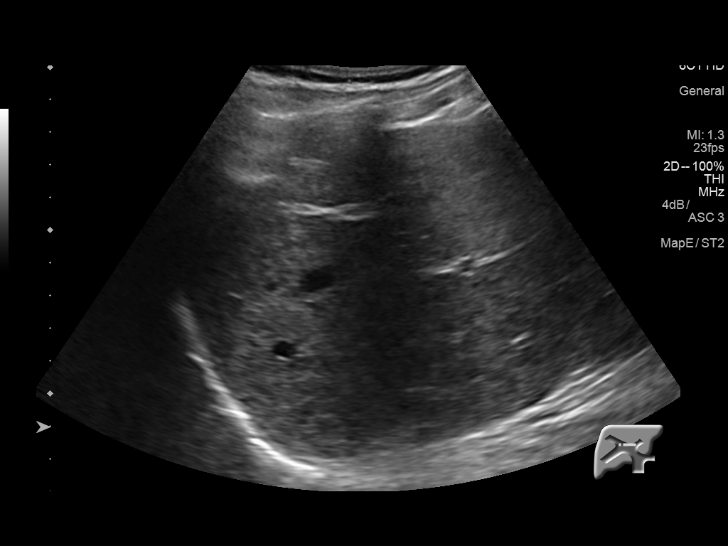
[im 66/66]
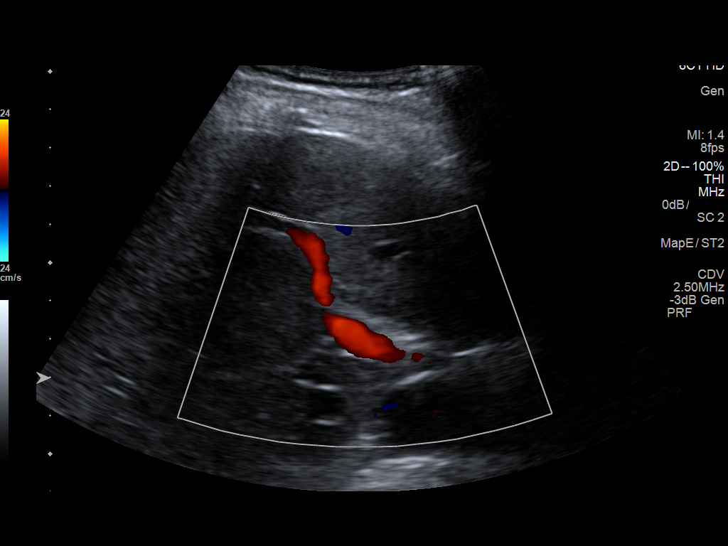

[14 of 25 positions shown; findings below may reference images not displayed]

FINDINGS: Gallbladder:

Minimal sludge in the gallbladder. No shadowing stones. Normal wall
thickness. Negative sonographic Murphy.

Common bile duct:

Diameter: Up to 2 mm

Liver:

No focal lesion identified. Within normal limits in parenchymal
echogenicity. Portal vein is patent on color Doppler imaging with
normal direction of blood flow towards the liver.
IMPRESSION: Small amount of gallbladder sludge. Negative for acute cholecystitis
or biliary dilatation

## 2019-10-25 ENCOUNTER — Emergency Department (HOSPITAL_COMMUNITY)
Admission: EM | Admit: 2019-10-25 | Discharge: 2019-10-25 | Disposition: A | Discharge status: Home / Self Care | Payer: 59 | Attending: Emergency Medicine | Admitting: Emergency Medicine

## 2019-10-25 ENCOUNTER — Encounter (HOSPITAL_COMMUNITY): Payer: Self-pay | Admitting: *Deleted

## 2019-10-25 ENCOUNTER — Other Ambulatory Visit: Payer: Self-pay

## 2019-10-25 DIAGNOSIS — R109 Unspecified abdominal pain: Secondary | ICD-10-CM | POA: Insufficient documentation

## 2019-10-25 DIAGNOSIS — Z5321 Procedure and treatment not carried out due to patient leaving prior to being seen by health care provider: Secondary | ICD-10-CM | POA: Insufficient documentation

## 2019-10-25 LAB — URINALYSIS, ROUTINE W REFLEX MICROSCOPIC
Bacteria, UA: NONE SEEN
Bilirubin Urine: NEGATIVE
Glucose, UA: NEGATIVE mg/dL
Ketones, ur: 5 mg/dL — AB
Nitrite: NEGATIVE
Protein, ur: NEGATIVE mg/dL
Specific Gravity, Urine: 1.017 (ref 1.005–1.030)
pH: 7 (ref 5.0–8.0)

## 2019-10-25 LAB — COMPREHENSIVE METABOLIC PANEL
ALT: 19 U/L (ref 0–44)
AST: 24 U/L (ref 15–41)
Albumin: 4.1 g/dL (ref 3.5–5.0)
Alkaline Phosphatase: 34 U/L — ABNORMAL LOW (ref 38–126)
Anion gap: 11 (ref 5–15)
BUN: 23 mg/dL — ABNORMAL HIGH (ref 6–20)
CO2: 27 mmol/L (ref 22–32)
Calcium: 8.6 mg/dL — ABNORMAL LOW (ref 8.9–10.3)
Chloride: 106 mmol/L (ref 98–111)
Creatinine, Ser: 0.92 mg/dL (ref 0.44–1.00)
GFR calc Af Amer: >60 mL/min (ref >60)
GFR calc non Af Amer: >60 mL/min (ref >60)
Glucose, Bld: 118 mg/dL — ABNORMAL HIGH (ref 70–99)
Potassium: 4.3 mmol/L (ref 3.5–5.1)
Sodium: 144 mmol/L (ref 135–145)
Total Bilirubin: 0.6 mg/dL (ref 0.3–1.2)
Total Protein: 7 g/dL (ref 6.5–8.1)

## 2019-10-25 LAB — CBC
HCT: 34.5 % — ABNORMAL LOW (ref 36.0–46.0)
Hemoglobin: 11.6 g/dL — ABNORMAL LOW (ref 12.0–15.0)
MCH: 32.3 pg (ref 26.0–34.0)
MCHC: 33.6 g/dL (ref 30.0–36.0)
MCV: 96.1 fL (ref 80.0–100.0)
Platelets: 165 10*3/uL (ref 150–400)
RBC: 3.59 MIL/uL — ABNORMAL LOW (ref 3.87–5.11)
RDW: 12.6 % (ref 11.5–15.5)
WBC: 9 10*3/uL (ref 4.0–10.5)
nRBC: 0 % (ref 0.0–0.2)

## 2019-10-25 LAB — I-STAT BETA HCG BLOOD, ED (MC, WL, AP ONLY): I-stat hCG, quantitative: 7.6 m[IU]/mL — ABNORMAL HIGH (ref <5)

## 2019-10-25 LAB — LIPASE, BLOOD: Lipase: 38 U/L (ref 11–51)

## 2019-10-25 NOTE — ED Notes (Signed)
Patient request for me to take the IV out so she could leave

## 2019-10-25 NOTE — ED Triage Notes (Addendum)
BIB EMS due to left flank pain with N/V since this morning. 116/68-65-16-100% RA  CBG 131

## 2019-10-30 ENCOUNTER — Other Ambulatory Visit: Payer: Self-pay | Admitting: Urology

## 2019-10-30 ENCOUNTER — Other Ambulatory Visit (HOSPITAL_COMMUNITY)
Admission: RE | Admit: 2019-10-30 | Discharge: 2019-10-30 | Disposition: A | Discharge status: Home / Self Care | Payer: 59 | Source: Ambulatory Visit | Attending: Urology | Admitting: Urology

## 2019-10-30 DIAGNOSIS — Z01812 Encounter for preprocedural laboratory examination: Secondary | ICD-10-CM | POA: Insufficient documentation

## 2019-10-30 DIAGNOSIS — Z20822 Contact with and (suspected) exposure to covid-19: Secondary | ICD-10-CM | POA: Insufficient documentation

## 2019-10-30 LAB — SARS CORONAVIRUS 2 (TAT 6-24 HRS): SARS Coronavirus 2: NEGATIVE

## 2019-10-30 NOTE — Progress Notes (Signed)
Patient to arrive at 0800 on 11/02/2019. History and medications reviewed. Pre-procedure instructions given. NPO after MN. Synthroid with sip of water in am. Driver secured.

## 2019-11-01 NOTE — H&P (Signed)
Office Visit Report     10/30/2019   --------------------------------------------------------------------------------   [name]  MRN: [id number]  DOB: [birth year], 59 year old Female  SSN:    PRIMARY CARE:    REFERRING:  Rubbie Battiest, NP  PROVIDER:  Wendie Simmer, M.D.  LOCATION:  [facility] [phone]     --------------------------------------------------------------------------------   CC/HPI: CC: Left flank pain  HPI:  10/30/2019  59 year old female presents with severe left-sided flank pain. She was told she had a kidney stone. Therefore, she presents for evaluation. CT scan shows an approximately 6 mm left distal ureteral calculus.     ALLERGIES: No Known Drug Allergies    MEDICATIONS: Ketorolac Tromethamine  Advil  Prolia  Relpax  Synthroid  Tramadol Hcl  Tylenol  Vitamin D     GU PSH: Hysterectomy     NON-GU PSH: Wrist Arthroscopy/surgery, Left     GU PMH: None   NON-GU PMH: Glaucoma    FAMILY HISTORY: 1 Daughter - Other Myocardial Infarction - Father   SOCIAL HISTORY: Marital Status: Married Preferred Language: English; Race: White Current Smoking Status: Patient has never smoked.   Tobacco Use Assessment Completed: Used Tobacco in last 30 days? Drinks 2 caffeinated drinks per day.    REVIEW OF SYSTEMS:    GU Review Female:   Patient reports frequent urination, burning /pain with urination, get up at night to urinate, stream starts and stops, and have to strain to urinate. Patient denies hard to postpone urination, leakage of urine, trouble starting your stream, and being pregnant.  Gastrointestinal (Upper):   Patient reports nausea. Patient denies vomiting and indigestion/ heartburn.  Gastrointestinal (Lower):   Patient denies diarrhea and constipation.  Constitutional:   Patient reports fatigue. Patient denies fever, night sweats, and weight loss.  Skin:   Patient denies skin rash/ lesion and itching.   Eyes:   Patient denies blurred vision and double vision.  Ears/ Nose/ Throat:   Patient denies sore throat and sinus problems.  Hematologic/Lymphatic:   Patient denies swollen glands and easy bruising.  Cardiovascular:   Patient denies chest pains and leg swelling.  Respiratory:   Patient denies cough and shortness of breath.  Endocrine:   Patient denies excessive thirst.  Musculoskeletal:   Patient reports back pain. Patient denies joint pain.  Neurological:   Patient denies headaches and dizziness.  Psychologic:   Patient denies depression and anxiety.   Notes: hematuria    VITAL SIGNS:      10/30/2019 10:56 AM  Weight 134 lb / 60.78 kg  Height 67 in / 170.18 cm  BP 155/83 mmHg  Heart Rate 69 /min  Temperature 98.0 F / 36.6 C  BMI 21.0 kg/m   MULTI-SYSTEM PHYSICAL EXAMINATION:    Constitutional: Well-nourished. No physical deformities. Normally developed. Good grooming.  Respiratory: No labored breathing, no use of accessory muscles.   Cardiovascular: Normal temperature, normal extremity pulses, no swelling, no varicosities.  Skin: No paleness, no jaundice, no cyanosis. No lesion, no ulcer, no rash.  Neurologic / Psychiatric: Oriented to time, oriented to place, oriented to person. No depression, no anxiety, no agitation.  Gastrointestinal: No mass, no tenderness, no rigidity, non obese abdomen.  Eyes: Normal conjunctivae. Normal eyelids.  Musculoskeletal: Normal gait and station of head and neck.     Complexity of Data:  Source Of History:  Patient  Urine Test Review:   Urinalysis   PROCEDURES:         C.T. Urogram -  74176  6 mm distal left ureteral calculus with upstream hydronephrosis      Patient confirmed No Neulasta OnPro Device.          KUB - [id number]  A single view of the abdomen is obtained.  left distal ureteral calculus redemonstrated.      Patient confirmed No Neulasta OnPro Device.           Ketoralac 60mg  - [id number], [id number] Qty: 60 Adm. By:  Anell Barr  Unit: mg Lot No [id number]  Route: IM Exp. Date 12/05/2020  Freq: None Mfgr.:   Site: Left Buttock   ASSESSMENT:      ICD-10 Details  1 GU:   Ureteral calculus - N20.1 Undiagnosed New Problem  2   Renal colic - C16 Undiagnosed New Problem   PLAN:           Orders Labs Urine Culture  X-Rays: C.T. Stone Protocol Without Contrast    KUB  X-Ray Notes: History:  Hematuria: Yes/No  Patient to see MD after exam: Yes/No  Previous exam: CT / IVP/ US/ KUB/ None  When:  Where:  Diabetic: Yes/ No  BUN/ Creatine:  Date of last BUN Creatinine:  Weight in pounds:  Allergy- Contrasts/ Shellfish: Yes/ No  Conflicting diabetic meds: Yes/ No  Oral contrast and instructions given to patient:   Prior Authorization #: [id number] valid 10/30/19 thru 12/14/19            Schedule         Document Letter(s):  Created for Patient: Clinical Summary         Notes:   We discussed the management of urinary stones. These options include observation, ureteroscopy, and shockwave lithotripsy. We discussed which options are relevant to these particular stones. We discussed the natural history of stones as well as the complications of untreated stones and the impact on quality of life without treatment as well as with each of the above listed treatments. We also discussed the efficacy of each treatment in its ability to clear the stone burden. With any of these management options I discussed the signs and symptoms of infection and the need for emergent treatment should these be experienced. For each option we discussed the ability of each procedure to clear the patient of their stone burden.   For observation I described the risks which include but are not limited to silent renal damage, life-threatening infection, need for emergent surgery, failure to pass stone, and pain.   For ureteroscopy I described the risks which include heart attack, stroke, pulmonary embolus, death, bleeding,  infection, damage to contiguous structures, positioning injury, ureteral stricture, ureteral avulsion, ureteral injury, need for ureteral stent, inability to perform ureteroscopy, need for an interval procedure, inability to clear stone burden, stent discomfort and pain.   For shockwave lithotripsy I described the risks which include arrhythmia, kidney contusion, kidney hemorrhage, need for transfusion, pain, inability to break up stone, inability to pass stone fragments, Steinstrasse, infection associated with obstructing stones, need for different surgical procedure, need for repeat shockwave lithotripsy   She would like to proceed with ESWL.    * Signed by Link Snuffer, III, M.D. on 10/30/19 at 12:23 PM (EDT)*

## 2019-11-02 ENCOUNTER — Ambulatory Visit (HOSPITAL_BASED_OUTPATIENT_CLINIC_OR_DEPARTMENT_OTHER)
Admission: RE | Admit: 2019-11-02 | Discharge: 2019-11-02 | Disposition: A | Discharge status: Home / Self Care | Payer: 59 | Attending: Urology | Admitting: Urology

## 2019-11-02 ENCOUNTER — Encounter (HOSPITAL_BASED_OUTPATIENT_CLINIC_OR_DEPARTMENT_OTHER): Payer: Self-pay | Admitting: Urology

## 2019-11-02 ENCOUNTER — Other Ambulatory Visit: Payer: Self-pay

## 2019-11-02 ENCOUNTER — Ambulatory Visit (HOSPITAL_COMMUNITY): Payer: 59

## 2019-11-02 ENCOUNTER — Encounter (HOSPITAL_BASED_OUTPATIENT_CLINIC_OR_DEPARTMENT_OTHER)
Admission: RE | Disposition: A | Discharge status: Home / Self Care | Payer: Self-pay | Source: Home / Self Care | Attending: Urology

## 2019-11-02 DIAGNOSIS — Z791 Long term (current) use of non-steroidal anti-inflammatories (NSAID): Secondary | ICD-10-CM | POA: Diagnosis not present

## 2019-11-02 DIAGNOSIS — N132 Hydronephrosis with renal and ureteral calculous obstruction: Secondary | ICD-10-CM | POA: Insufficient documentation

## 2019-11-02 DIAGNOSIS — N201 Calculus of ureter: Secondary | ICD-10-CM

## 2019-11-02 DIAGNOSIS — Z8249 Family history of ischemic heart disease and other diseases of the circulatory system: Secondary | ICD-10-CM | POA: Insufficient documentation

## 2019-11-02 DIAGNOSIS — Z79899 Other long term (current) drug therapy: Secondary | ICD-10-CM | POA: Insufficient documentation

## 2019-11-02 HISTORY — PX: EXTRACORPOREAL SHOCK WAVE LITHOTRIPSY: SHX1557

## 2019-11-02 SURGERY — LITHOTRIPSY, ESWL
Anesthesia: LOCAL | Laterality: Left

## 2019-11-02 MED ORDER — CIPROFLOXACIN HCL 500 MG PO TABS
ORAL_TABLET | ORAL | Status: AC
  Filled 2019-11-02: qty 1

## 2019-11-02 MED ORDER — PROMETHAZINE HCL 25 MG/ML IJ SOLN
INTRAMUSCULAR | Status: AC
  Filled 2019-11-02: qty 1

## 2019-11-02 MED ORDER — HYDROCODONE-ACETAMINOPHEN 5-325 MG PO TABS
1.0000 | ORAL_TABLET | Freq: Four times a day (QID) | ORAL | 0 refills | Status: DC | PRN
Start: 2019-11-02 — End: 2019-12-14

## 2019-11-02 MED ORDER — PROMETHAZINE HCL 25 MG/ML IJ SOLN
12.5000 mg | Freq: Once | INTRAMUSCULAR | Status: AC
  Administered 2019-11-02: 12.5 mg via INTRAVENOUS

## 2019-11-02 MED ORDER — DIAZEPAM 5 MG PO TABS
10.0000 mg | ORAL_TABLET | ORAL | Status: AC
  Administered 2019-11-02: 10 mg via ORAL

## 2019-11-02 MED ORDER — DIPHENHYDRAMINE HCL 25 MG PO CAPS
25.0000 mg | ORAL_CAPSULE | ORAL | Status: AC
  Administered 2019-11-02: 25 mg via ORAL

## 2019-11-02 MED ORDER — CIPROFLOXACIN HCL 500 MG PO TABS
500.0000 mg | ORAL_TABLET | ORAL | Status: AC
  Administered 2019-11-02: 500 mg via ORAL

## 2019-11-02 MED ORDER — SODIUM CHLORIDE 0.9 % IV SOLN: INTRAVENOUS | Status: DC

## 2019-11-02 MED ORDER — DIAZEPAM 5 MG PO TABS
ORAL_TABLET | ORAL | Status: AC
  Filled 2019-11-02: qty 2

## 2019-11-02 MED ORDER — DIPHENHYDRAMINE HCL 25 MG PO CAPS
ORAL_CAPSULE | ORAL | Status: AC
  Filled 2019-11-02: qty 1

## 2019-11-02 NOTE — Op Note (Signed)
See [facility] operative note scanned into chart. Also because of the size, density, location and other factors that cannot be anticipated I feel this will likely be a staged procedure. This fact supersedes any indication in the scanned [facility] operative note to the contrary.  

## 2019-11-02 NOTE — Discharge Instructions (Signed)
1. You should strain your urine and collect all fragments and bring them to your follow up appointment.  2. You should take your pain medication as needed.  Please call if your pain is severe to the point that it is not controlled with your pain medication. 3. You should call if you develop fever > 101 or persistent nausea or vomiting.

## 2019-11-02 NOTE — Interval H&P Note (Signed)
History and Physical Interval Note:  11/02/2019 7:57 AM  [name]  has presented today for surgery, with the diagnosis of LEFT URETERAL STONE.  The various methods of treatment have been discussed with the patient and family. After consideration of risks, benefits and other options for treatment, the patient has consented to  Procedure(s): EXTRACORPOREAL SHOCK WAVE LITHOTRIPSY (ESWL) (Left) as a surgical intervention.  The patient's history has been reviewed, patient examined, no change in status, stable for surgery.  I have reviewed the patient's chart and labs.  Questions were answered to the patient's satisfaction.     [employer]

## 2019-11-03 ENCOUNTER — Encounter (HOSPITAL_BASED_OUTPATIENT_CLINIC_OR_DEPARTMENT_OTHER): Payer: Self-pay | Admitting: Urology

## 2019-12-10 ENCOUNTER — Other Ambulatory Visit: Payer: Self-pay | Admitting: Internal Medicine

## 2019-12-11 ENCOUNTER — Encounter: Payer: 59 | Admitting: Internal Medicine

## 2019-12-11 NOTE — Progress Notes (Signed)
I, [name], LAT, ATC, am serving as scribe for Dr. Lynne Leader.  [name] is a 59 y.o. female who presents to [place] at [facility] today for f/u of R hip pain and wants a R hip injection.  She was last seen by Dr. Tamala Julian on 07/01/19 and had a R GT injection and L knee injection.  She was advised in hip aBd strengthening and core stability exercises and prescribed Meloxicam for breakthrough pain.  Since her last visit, pt reports that her R lateral hip has been bothering her for a few months.  She states that the last injection only lasted for approximately 3 weeks.  She has been icing and heating her hip and using a combination of Tylenol, IBU and Aleve.  She would also like to discuss L-sided neck pain.  She notes crepitus in her neck w/ cervical AROM.  Diagnostic testing: R hip XR- 01/22/18; L-spine XR- 01/07/18  Pertinent review of systems: No fevers or chills  Relevant historical information: Symptomatic PVC, migraine history, hypothyroidism.  Recent history of kidney stones with infection.   Exam:  BP 108/70 (BP Location: Left Arm, Patient Position: Sitting, Cuff Size: Normal)   Pulse 62   Ht 5\' 7"  (1.702 m)   Wt 128 lb 9.6 oz (58.3 kg)   SpO2 99%   BMI 20.14 kg/m  General: Well Developed, well nourished, and in no acute distress.   MSK: C-spine normal-appearing Tender palpation left trapezius. Normal cervical motion.  Right hip normal-appearing Normal motion. Tender palpation greater trochanter. Hip abduction and external rotation strength diminished 4/5 with pain. Some pain with crossover stretch    Lab and Radiology Results  Procedure: Real-time Ultrasound Guided Injection of right lateral hip greater trochanter bursa Device: Philips Affiniti 50G Images permanently stored and available for review in PACS Verbal informed consent obtained.  Discussed risks and benefits of procedure. Warned about infection bleeding damage to  structures skin hypopigmentation and fat atrophy among others. Patient expresses understanding and agreement Time-out conducted.   Noted no overlying erythema, induration, or other signs of local infection.   Skin prepped in a sterile fashion.   Local anesthesia: Topical Ethyl chloride.   With sterile technique and under real time ultrasound guidance:  40 mg of Kenalog and 2 mL of Marcaine injected into lateral hip greater trochanter bursa. Fluid seen entering the bursa.   Completed without difficulty   Pain immediately resolved suggesting accurate placement of the medication.   Advised to call if fevers/chills, erythema, induration, drainage, or persistent bleeding.   Images permanently stored and available for review in the ultrasound unit.  Impression: Technically successful ultrasound guided injection.    EXAM: DG HIP (WITH OR WITHOUT PELVIS) 2-3V RIGHT  COMPARISON:  None.  FINDINGS: No evidence of acute, subacute or healed fractures. Mild MEDIAL joint space narrowing. Well-preserved bone mineral density.  Included AP pelvis demonstrates symmetric mild MEDIAL joint space narrowing in the contralateral LEFT hip. Sacroiliac joints and symphysis pubis intact without significant degenerative change.  IMPRESSION: 1. No acute or subacute osseous abnormality. 2. 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   Electronically Signed   By: Evangeline Dakin M.D.   On: 01/22/2018 12:59  EXAM: CERVICAL SPINE - COMPLETE 4+ VIEW  COMPARISON:  AP and lateral views of the cervical spine of August 25, 2010  FINDINGS: The cervical vertebral bodies are preserved in height. There is disc space narrowing at C5-6 which has progressed since the previous  study. There is no perched facet or spinous process fracture. The oblique views reveal bony encroachment upon the neural foramina bilaterally at C5 and at C3. The prevertebral soft tissues appear normal. The  odontoid is intact.  IMPRESSION: There is no compression fracture. There is moderate disc space narrowing at C5-6. There is bony encroachment upon the neural foramina bilaterally at C3 and at C5.   Electronically Signed   By: David  Martinique M.D.   On: 11/05/2016 10:57  I, Lynne Leader, personally (independently) visualized and performed the interpretation of the images attached in this note.    Assessment and Plan: 59 y.o. 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  Exacerbation of chronic issue.  At this point somewhat failing conservative management.  We will repeat injection but stressed the importance of dedicated home exercise program.  She notes he started doing this some.  My next step would be referral to physical therapy.  She would like to try it on her own first.  Home exercise programs were reviewed again with ATC in clinic today prior to discharge.  If not improving she will let me know and I will proceed with physical therapy referral.  Left lateral neck pain.  Exacerbation of chronic issue.  Patient does have a history of degenerative change seen on prior x-ray of her cervical spine.  Main issue I think is muscle spasm and dysfunction.  Again this would be very treatable with physical therapy.  However she would like to try it on her own first as well.  Again home exercises reviewed with ATC in clinic and again if not improving would recommend referral to physical therapy.  Recheck with myself or Dr. Tamala Julian as needed in the future.  [id number]; 15 additional minutes spent for Therapeutic exercises as stated in above notes.  This included exercises focusing on stretching, strengthening, with significant focus on eccentric aspects.   Long term goals include an improvement in range of motion, strength, endurance as well as avoiding reinjury. Patient's frequency would include in 1-2 times a day, 3-5 times a week for a duration  of 6-12 weeks.  Proper technique shown and discussed handout in great detail with ATC.  All questions were discussed and answered.   PDMP not reviewed this encounter. Orders Placed This Encounter  Procedures  . [place] LIMITED JOINT SPACE STRUCTURES LOW LEFT(NO LINKED CHARGES)    Order Specific Question:   Reason for Exam (SYMPTOM  OR DIAGNOSIS REQUIRED)    Answer:   L lateral hip pain    Order Specific Question:   Preferred imaging location?    Answer:   [facility]   No orders of the defined types were placed in this encounter.    Discussed warning signs or symptoms. Please see discharge instructions. Patient expresses understanding.   The above documentation has been reviewed and is accurate and complete Lynne Leader, M.D.

## 2019-12-14 ENCOUNTER — Ambulatory Visit (INDEPENDENT_AMBULATORY_CARE_PROVIDER_SITE_OTHER): Payer: 59 | Admitting: Family Medicine

## 2019-12-14 ENCOUNTER — Ambulatory Visit: Payer: Self-pay

## 2019-12-14 ENCOUNTER — Other Ambulatory Visit: Payer: Self-pay

## 2019-12-14 ENCOUNTER — Encounter: Payer: Self-pay | Admitting: Family Medicine

## 2019-12-14 VITALS — BP 108/70 | HR 62 | Ht 67.0 in | Wt 128.6 lb

## 2019-12-14 DIAGNOSIS — M25552 Pain in left hip: Secondary | ICD-10-CM | POA: Diagnosis not present

## 2019-12-14 DIAGNOSIS — M542 Cervicalgia: Secondary | ICD-10-CM | POA: Diagnosis not present

## 2019-12-14 NOTE — Patient Instructions (Signed)
Thanks for coming in today.   Please perform the exercise program that we have prepared for you and gone over in detail on a daily basis.  In addition to the handout you were provided you can access your program through: [email or web address]   Your unique program code is: [email or web address]  If you're not feeling better with your home exercise program after approximately one month, let us know and we'll order PT.  Follow up as needed.

## 2019-12-16 ENCOUNTER — Other Ambulatory Visit: Payer: Self-pay | Admitting: Family Medicine

## 2020-01-05 ENCOUNTER — Telehealth: Payer: Self-pay | Admitting: Internal Medicine

## 2020-01-05 NOTE — Telephone Encounter (Signed)
Patient wondering if she can get her lab work done before her appointment on 12.06.21

## 2020-01-06 ENCOUNTER — Telehealth: Payer: Self-pay | Admitting: Internal Medicine

## 2020-01-06 DIAGNOSIS — E559 Vitamin D deficiency, unspecified: Secondary | ICD-10-CM

## 2020-01-06 DIAGNOSIS — Z0000 Encounter for general adult medical examination without abnormal findings: Secondary | ICD-10-CM

## 2020-01-06 DIAGNOSIS — E611 Iron deficiency: Secondary | ICD-10-CM

## 2020-01-06 NOTE — Telephone Encounter (Signed)
Sent to Dr. John. 

## 2020-01-06 NOTE — Telephone Encounter (Signed)
Labs ordered.

## 2020-01-06 NOTE — Telephone Encounter (Signed)
Labs ordered for the [facility] lab

## 2020-01-08 ENCOUNTER — Other Ambulatory Visit (INDEPENDENT_AMBULATORY_CARE_PROVIDER_SITE_OTHER): Payer: 59

## 2020-01-08 DIAGNOSIS — Z0000 Encounter for general adult medical examination without abnormal findings: Secondary | ICD-10-CM | POA: Diagnosis not present

## 2020-01-08 DIAGNOSIS — E559 Vitamin D deficiency, unspecified: Secondary | ICD-10-CM

## 2020-01-08 DIAGNOSIS — E611 Iron deficiency: Secondary | ICD-10-CM | POA: Diagnosis not present

## 2020-01-08 LAB — BASIC METABOLIC PANEL
BUN: 24 mg/dL — ABNORMAL HIGH (ref 6–23)
CO2: 27 mEq/L (ref 19–32)
Calcium: 8.9 mg/dL (ref 8.4–10.5)
Chloride: 109 mEq/L (ref 96–112)
Creatinine, Ser: 0.71 mg/dL (ref 0.40–1.20)
GFR: 92.55 mL/min (ref >60.00)
Glucose, Bld: 92 mg/dL (ref 70–99)
Potassium: 4.3 mEq/L (ref 3.5–5.1)
Sodium: 143 mEq/L (ref 135–145)

## 2020-01-08 LAB — URINALYSIS, ROUTINE W REFLEX MICROSCOPIC
Bilirubin Urine: NEGATIVE
Hgb urine dipstick: NEGATIVE
Ketones, ur: NEGATIVE
Leukocytes,Ua: NEGATIVE
Nitrite: NEGATIVE
RBC / HPF: NONE SEEN (ref 0–?)
Specific Gravity, Urine: >=1.03 — AB (ref 1.000–1.030)
Total Protein, Urine: NEGATIVE
Urine Glucose: NEGATIVE
Urobilinogen, UA: 0.2 (ref 0.0–1.0)
pH: 6 (ref 5.0–8.0)

## 2020-01-08 LAB — HEPATIC FUNCTION PANEL
ALT: 11 U/L (ref 0–35)
AST: 18 U/L (ref 0–37)
Albumin: 4 g/dL (ref 3.5–5.2)
Alkaline Phosphatase: 31 U/L — ABNORMAL LOW (ref 39–117)
Bilirubin, Direct: 0.1 mg/dL (ref 0.0–0.3)
Total Bilirubin: 0.5 mg/dL (ref 0.2–1.2)
Total Protein: 6.5 g/dL (ref 6.0–8.3)

## 2020-01-08 LAB — CBC WITH DIFFERENTIAL/PLATELET
Basophils Absolute: 0.1 10*3/uL (ref 0.0–0.1)
Basophils Relative: 1.2 % (ref 0.0–3.0)
Eosinophils Absolute: 0.3 10*3/uL (ref 0.0–0.7)
Eosinophils Relative: 6.7 % — ABNORMAL HIGH (ref 0.0–5.0)
HCT: 35 % — ABNORMAL LOW (ref 36.0–46.0)
Hemoglobin: 11.8 g/dL — ABNORMAL LOW (ref 12.0–15.0)
Lymphocytes Relative: 42.9 % (ref 12.0–46.0)
Lymphs Abs: 2 10*3/uL (ref 0.7–4.0)
MCHC: 33.8 g/dL (ref 30.0–36.0)
MCV: 91.7 fl (ref 78.0–100.0)
Monocytes Absolute: 0.4 10*3/uL (ref 0.1–1.0)
Monocytes Relative: 9.5 % (ref 3.0–12.0)
Neutro Abs: 1.8 10*3/uL (ref 1.4–7.7)
Neutrophils Relative %: 39.7 % — ABNORMAL LOW (ref 43.0–77.0)
Platelets: 169 10*3/uL (ref 150.0–400.0)
RBC: 3.82 Mil/uL — ABNORMAL LOW (ref 3.87–5.11)
RDW: 14.5 % (ref 11.5–15.5)
WBC: 4.6 10*3/uL (ref 4.0–10.5)

## 2020-01-08 LAB — IBC PANEL
Iron: 51 ug/dL (ref 42–145)
Saturation Ratios: 15.4 % — ABNORMAL LOW (ref 20.0–50.0)
Transferrin: 236 mg/dL (ref 212.0–360.0)

## 2020-01-08 LAB — LIPID PANEL
Cholesterol: 208 mg/dL — ABNORMAL HIGH (ref 0–200)
HDL: 89.6 mg/dL (ref >39.00)
LDL Cholesterol: 110 mg/dL — ABNORMAL HIGH (ref 0–99)
NonHDL: 118.46
Total CHOL/HDL Ratio: 2
Triglycerides: 42 mg/dL (ref 0.0–149.0)
VLDL: 8.4 mg/dL (ref 0.0–40.0)

## 2020-01-08 LAB — VITAMIN D 25 HYDROXY (VIT D DEFICIENCY, FRACTURES): VITD: 42.98 ng/mL (ref 30.00–100.00)

## 2020-01-08 LAB — TSH: TSH: 5.33 u[IU]/mL — ABNORMAL HIGH (ref 0.35–4.50)

## 2020-01-08 LAB — FERRITIN: Ferritin: 23.4 ng/mL (ref 10.0–291.0)

## 2020-01-11 ENCOUNTER — Encounter: Payer: 59 | Admitting: Internal Medicine

## 2020-01-11 ENCOUNTER — Encounter: Payer: Self-pay | Admitting: Internal Medicine

## 2020-01-11 ENCOUNTER — Other Ambulatory Visit: Payer: Self-pay

## 2020-01-11 ENCOUNTER — Ambulatory Visit (INDEPENDENT_AMBULATORY_CARE_PROVIDER_SITE_OTHER): Payer: 59 | Admitting: Internal Medicine

## 2020-01-11 VITALS — BP 120/86 | HR 58 | Temp 98.4°F | Ht 67.0 in | Wt 128.0 lb

## 2020-01-11 DIAGNOSIS — Z0001 Encounter for general adult medical examination with abnormal findings: Secondary | ICD-10-CM | POA: Diagnosis not present

## 2020-01-11 DIAGNOSIS — E538 Deficiency of other specified B group vitamins: Secondary | ICD-10-CM | POA: Diagnosis not present

## 2020-01-11 DIAGNOSIS — N200 Calculus of kidney: Secondary | ICD-10-CM

## 2020-01-11 DIAGNOSIS — D649 Anemia, unspecified: Secondary | ICD-10-CM

## 2020-01-11 DIAGNOSIS — Z23 Encounter for immunization: Secondary | ICD-10-CM

## 2020-01-11 DIAGNOSIS — D509 Iron deficiency anemia, unspecified: Secondary | ICD-10-CM

## 2020-01-11 DIAGNOSIS — E7849 Other hyperlipidemia: Secondary | ICD-10-CM

## 2020-01-11 DIAGNOSIS — M810 Age-related osteoporosis without current pathological fracture: Secondary | ICD-10-CM

## 2020-01-11 DIAGNOSIS — E039 Hypothyroidism, unspecified: Secondary | ICD-10-CM

## 2020-01-11 DIAGNOSIS — E559 Vitamin D deficiency, unspecified: Secondary | ICD-10-CM

## 2020-01-11 HISTORY — DX: Calculus of kidney: N20.0

## 2020-01-11 LAB — VITAMIN B12: Vitamin B-12: 407 pg/mL (ref 211–911)

## 2020-01-11 MED ORDER — LEVOTHYROXINE SODIUM 75 MCG PO TABS
75.0000 ug | ORAL_TABLET | Freq: Every day | ORAL | 3 refills | Status: DC
Start: 2020-01-11 — End: 2020-12-09

## 2020-01-11 NOTE — Assessment & Plan Note (Signed)
With mild elev TSH likely due to not taking meds when ill with renal stone active, now back to taking daily, for TFTs with next labs 1 mo

## 2020-01-11 NOTE — Progress Notes (Signed)
Subjective:    Patient ID: [name], female    DOB: [birth year], 59 y.o.   MRN: [id number]  HPI  Here for wellness and f/u;  Overall doing ok;  Pt denies Chest pain, worsening SOB, DOE, wheezing, orthopnea, PND, worsening LE edema, palpitations, dizziness or syncope.  Pt denies neurological change such as new headache, facial or extremity weakness.  Pt denies polydipsia, polyuria, or low sugar symptoms. Pt states overall good compliance with treatment and medications, good tolerability, and has been trying to follow appropriate diet.  Pt denies worsening depressive symptoms, suicidal ideation or panic. No fever, night sweats, wt loss, loss of appetite, or other constitutional symptoms.  Pt states good ability with ADL's, has low fall risk, home safety reviewed and adequate, no other significant changes in hearing or vision, and only occasionally active with exercise. Also Has 2 torn rotater cuff and putting off injections and surgury for no.   - sees Dr Tamala Julian and Dr Melanee Spry.  Declines covid booster.  Had a renal stone in sept 2021, unfortunately given sulfa med she is allergic, had severe muscle pain, with last rxn as a teen .Marland Kitchen  Had some concern for ovary cancer at the time, with eval and f/u with GYN at 6 mo planned, Dr Radene Knee.   Past Medical History:  Diagnosis Date  . Allergic rhinitis   . Anemia    history of anemia secondary to fibroid sx  . Anxiety and depression   . Cervical disc disease 07/20/2010  . Cyst of ovary    bilateral  . Foot fracture, left    hx of  . Hyperlipidemia   . Hypothyroidism   . IBS (irritable bowel syndrome)   . Iron deficiency 07/20/2010  . Low back pain   . Lumbar disc disease 07/20/2010  . Osteoporosis 07/20/2010  . Post-operative nausea and vomiting   . Renal stone 01/11/2020  . 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 Vitamin D deficiency 07/20/2010   Past Surgical History:  Procedure Laterality Date  . COLONOSCOPY    . EXTRACORPOREAL SHOCK WAVE LITHOTRIPSY  Left 11/02/2019   Procedure: EXTRACORPOREAL SHOCK WAVE LITHOTRIPSY (ESWL);  Surgeon: Raynelle Bring, MD;  Location: [facility];  Service: Urology;  Laterality: Left;  . fibroid sx    . right lateral epicondylar    . TRIGGER FINGER RELEASE  2016  . VAGINAL HYSTERECTOMY    . WRIST FRACTURE SURGERY Left 05/25/2014    reports that she has never smoked. She has never used smokeless tobacco. She reports that she does not drink alcohol and does not use drugs. family history includes Alcohol abuse in her father and mother; Heart attack in her father. Allergies  Allergen Reactions  . Sulfa Antibiotics Other (See Comments)  . Sulfonamide Derivatives Other (See Comments)    Pt unknown reaction    Current Outpatient Medications on File Prior to Visit  Medication Sig Dispense Refill  . acetaminophen (TYLENOL) 500 MG tablet Take 1,000 mg by mouth every 6 (six) hours as needed for mild pain, moderate pain or headache.    . denosumab (PROLIA) 60 MG/ML SOLN injection Inject 1 Dose into the skin every 6 (six) months.    . docusate sodium (COLACE) 100 MG capsule Take 200 mg by mouth daily.    . RELPAX 40 MG tablet TAKE 1 TABLET DAILY AS NEEDED FOR MIGRAINE. 6 tablet 0   No current facility-administered medications on file prior to visit.   Review of Systems All otherwise neg  per pt     Objective:   Physical Exam BP 120/86 (BP Location: Left Arm, Patient Position: Sitting, Cuff Size: Large)   Pulse (!) 58   Temp 98.4 F (36.9 C) (Oral)   Ht 5\' 7"  (1.702 m)   Wt 128 lb (58.1 kg)   SpO2 98%   BMI 20.05 kg/m  VS noted,  Constitutional: Pt appears in NAD HENT: Head: NCAT.  Right Ear: External ear normal.  Left Ear: External ear normal.  Eyes: . Pupils are equal, round, and reactive to light. Conjunctivae and EOM are normal Nose: without d/c or deformity Neck: Neck supple. Gross normal ROM Cardiovascular: Normal rate and regular rhythm.   Pulmonary/Chest: Effort normal and  breath sounds without rales or wheezing.  Abd:  Soft, NT, ND, + BS, no organomegaly Neurological: Pt is alert. At baseline orientation, motor grossly intact Skin: Skin is warm. No rashes, other new lesions, no LE edema Psychiatric: Pt behavior is normal without agitation  All otherwise neg per pt Lab Results  Component Value Date   WBC 4.6 01/08/2020   HGB 11.8 (L) 01/08/2020   HCT 35.0 (L) 01/08/2020   PLT 169.0 01/08/2020   GLUCOSE 92 01/08/2020   CHOL 208 (H) 01/08/2020   TRIG 42.0 01/08/2020   HDL 89.60 01/08/2020   LDLDIRECT 95.5 07/20/2010   LDLCALC 110 (H) 01/08/2020   ALT 11 01/08/2020   AST 18 01/08/2020   NA 143 01/08/2020   K 4.3 01/08/2020   CL 109 01/08/2020   CREATININE 0.71 01/08/2020   BUN 24 (H) 01/08/2020   CO2 27 01/08/2020   TSH 5.33 (H) 01/08/2020          Assessment & Plan:

## 2020-01-11 NOTE — Addendum Note (Signed)
Addended by: Marijean Heath R on: 01/11/2020 03:10 PM   Modules accepted: Orders

## 2020-01-11 NOTE — Assessment & Plan Note (Signed)
Mild, for lower chol diet, declines statin

## 2020-01-11 NOTE — Assessment & Plan Note (Signed)
Cont oral replacement 

## 2020-01-11 NOTE — Assessment & Plan Note (Signed)
To continue prolia 

## 2020-01-11 NOTE — Assessment & Plan Note (Signed)
Mild, etiology unclear, for b12 check today, and repeat cbc in 1 mo

## 2020-01-11 NOTE — Assessment & Plan Note (Signed)
No evidence for iron deficiency, ok to follow

## 2020-01-11 NOTE — Patient Instructions (Addendum)
You had the flu shot today  Please remember to have the shingles shot #2 soon  Please continue all other medications as before, and refills have been done if requested.  Please have the pharmacy call with any other refills you may need.  Please continue your efforts at being more active, low cholesterol diet, and weight control.  You are otherwise up to date with prevention measures today.  Please keep your appointments with your specialists as you may have planned  Please go to the LAB at the blood drawing area for the tests to be done - just the B12 level today  Please go to the LAB at the blood drawing area for the other tests to be done in 1 month - the thyroid testing and blood counts  You will be contacted by phone if any changes need to be made immediately.  Otherwise, you will receive a letter about your results with an explanation, but please check with MyChart first.  Please remember to sign up for MyChart if you have not done so, as this will be important to you in the future with finding out test results, communicating by private email, and scheduling acute appointments online when needed.  Please make an Appointment to return for your 1 year visit, or sooner if needed, with Lab testing by Appointment as well, to be done about 3-5 days before at the [facility] (so this is for TWO appointments - please see the scheduling desk as you leave)  Due to the ongoing Covid 19 pandemic, our lab now requires an appointment for any labs done at our office.  If you need labs done and do not have an appointment, please call our office ahead of time to schedule before presenting to the lab for your testing.

## 2020-01-11 NOTE — Assessment & Plan Note (Addendum)
Left stone passed sept 8301 with UTI complicated with tx with sulfa med unfortunately, now resolved, to avoid red meat  I spent 31 minutes in addition to time for CPX wellness examination in preparing to see the patient by review of recent labs, imaging and procedures, obtaining and reviewing separately obtained history, communicating with the patient and family or caregiver, ordering medications, tests or procedures, and documenting clinical information in the EHR including the differential Dx, treatment, and any further evaluation and other management of renal stone, vit d def, normocytic anemia, osteoporosis, hypothyroidism, hld , iron deficiency

## 2020-01-11 NOTE — Assessment & Plan Note (Signed)

## 2020-01-11 NOTE — Addendum Note (Signed)
Addended by: Boris Lown B on: 01/11/2020 10:09 AM   Modules accepted: Orders

## 2020-01-15 ENCOUNTER — Telehealth: Payer: Self-pay | Admitting: Internal Medicine

## 2020-01-15 NOTE — Telephone Encounter (Addendum)
Follow up message  Patient calling back also to report she is coughing yellow mucus. 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 Advised patient to be responsible; wear a mask if she plans to continue with plans. Patient declined Saturday clinic appointment Patient requesting call back  Please call

## 2020-01-15 NOTE — Telephone Encounter (Signed)
    Patient calling to report she developed runny nose/ stuffy nose and cough after getting flu shot 01/11/20. Patient taking Sudafed and Mucinex  Seeking advice

## 2020-01-16 ENCOUNTER — Ambulatory Visit
Admission: EM | Admit: 2020-01-16 | Discharge: 2020-01-16 | Disposition: A | Discharge status: Home / Self Care | Payer: 59 | Attending: Emergency Medicine | Admitting: Emergency Medicine

## 2020-01-16 ENCOUNTER — Other Ambulatory Visit: Payer: Self-pay

## 2020-01-16 ENCOUNTER — Encounter: Payer: Self-pay | Admitting: Emergency Medicine

## 2020-01-16 DIAGNOSIS — J22 Unspecified acute lower respiratory infection: Secondary | ICD-10-CM | POA: Diagnosis not present

## 2020-01-16 MED ORDER — AZITHROMYCIN 250 MG PO TABS: 250.0000 mg | ORAL_TABLET | Freq: Every day | ORAL | 0 refills | Status: DC

## 2020-01-16 MED ORDER — BENZONATATE 200 MG PO CAPS: 200.0000 mg | ORAL_CAPSULE | Freq: Three times a day (TID) | ORAL | 0 refills | Status: AC | PRN

## 2020-01-16 NOTE — ED Provider Notes (Signed)
[facility]    CSN: [id number] Arrival date & time: 01/16/20  0258      History   Chief Complaint Chief Complaint  Patient presents with  . URI    HPI [name] is a 59 y.o. female presenting today for evaluation of URI symptoms.  Reports that over the past 2 to 3 weeks she has had cough and congestion.  Reports she has had a lot of postnasal drainage.  Reports of recently her symptoms have worsened and has noticed that the mucus becoming thicker and discolored.  Symptoms more in her upper chest.  Denies fevers.  Using Mucinex and Sudafed.  Frequently happens after exposure to outdoors.  Started after blowing leaves.  HPI  Past Medical History:  Diagnosis Date  . Allergic rhinitis   . Anemia    history of anemia secondary to fibroid sx  . Anxiety and depression   . Cervical disc disease 07/20/2010  . Cyst of ovary    bilateral  . Foot fracture, left    hx of  . Hyperlipidemia   . Hypothyroidism   . IBS (irritable bowel syndrome)   . Iron deficiency 07/20/2010  . Low back pain   . Lumbar disc disease 07/20/2010  . Osteoporosis 07/20/2010  . Post-operative nausea and vomiting   . Renal stone 01/11/2020  . 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 Vitamin D deficiency 07/20/2010    Patient Active Problem List   Diagnosis Date Noted  . Renal stone 01/11/2020  . Normocytic anemia 01/11/2020  . Right wrist injury 07/01/2019  . Patellofemoral arthritis of left knee 07/01/2019  . Right rotator cuff tear 11/13/2018  . GERD (gastroesophageal reflux disease) 11/07/2018  . Lisfranc's sprain, left, initial encounter 11/12/2017  . RUQ pain 04/25/2017  . Rash 10/20/2016  . Nonallopathic lesion of cervical region 08/30/2016  . Nonallopathic lesion of thoracic region 08/30/2016  . Nonallopathic lesion of rib cage 08/30/2016  . Venous (peripheral) insufficiency 10/25/2015  . 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 Bursitis of right shoulder 06/07/2015  . Left  rotator cuff tear 06/07/2015  . Trigger middle finger of right hand 06/07/2015  . Hearing loss 10/07/2014  . Anemia, iron deficiency 02/11/2014  . Acute upper respiratory infection 02/09/2014  . Fatigue 02/09/2014  . Greater trochanteric bursitis 09/09/2013  . Other malaise and fatigue 08/18/2013  . Right hip pain 08/18/2013  . Insomnia 07/20/2010  . Lumbar disc disease 07/20/2010  . Cervical disc disease 07/20/2010  . Vitamin D deficiency 07/20/2010  . Iron deficiency 07/20/2010  . Osteoporosis 07/20/2010  . 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 Encounter for well adult exam with abnormal findings 07/18/2010  . Palpitations 06/06/2010  . Anal fissure 10/04/2009  . RECTAL BLEEDING 10/04/2009  . NAUSEA 10/04/2009  . BACK PAIN 04/11/2009  . FRACTURE, FOOT 11/12/2008  . HLD (hyperlipidemia) 03/12/2007  . Anxiety state 03/12/2007  . COMMON MIGRAINE 03/12/2007  . ALLERGIC RHINITIS 03/12/2007  . IBS 03/12/2007  . Pain in thoracic spine 03/12/2007  . LOW BACK PAIN 03/12/2007  . PERIPHERAL EDEMA 03/12/2007  . Hypothyroidism 11/29/2006  . Headache(784.0) 11/29/2006    Past Surgical History:  Procedure Laterality Date  . COLONOSCOPY    . EXTRACORPOREAL SHOCK WAVE LITHOTRIPSY Left 11/02/2019   Procedure: EXTRACORPOREAL SHOCK WAVE LITHOTRIPSY (ESWL);  Surgeon: Raynelle Bring, MD;  Location: [facility];  Service: Urology;  Laterality: Left;  . fibroid sx    . right lateral epicondylar    . TRIGGER  FINGER RELEASE  2016  . VAGINAL HYSTERECTOMY    . WRIST FRACTURE SURGERY Left 05/25/2014    OB History   No obstetric history on file.      Home Medications    Prior to Admission medications   Medication Sig Start Date End Date Taking? Authorizing Provider  acetaminophen (TYLENOL) 500 MG tablet Take 1,000 mg by mouth every 6 (six) hours as needed for mild pain, moderate pain or headache.    [provider]  azithromycin (ZITHROMAX) 250 MG tablet Take 1 tablet  (250 mg total) by mouth daily. Take first 2 tablets together, then 1 every day until finished. 01/16/20   Zyon Grout C, PA-C  benzonatate (TESSALON) 200 MG capsule Take 1 capsule (200 mg total) by mouth 3 (three) times daily as needed for up to 7 days for cough. 01/16/20 01/23/20  Elvert Cumpton C, PA-C  denosumab (PROLIA) 60 MG/ML SOLN injection Inject 1 Dose into the skin every 6 (six) months.    [provider]  docusate sodium (COLACE) 100 MG capsule Take 200 mg by mouth daily.    [provider]  levothyroxine (SYNTHROID) 75 MCG tablet Take 1 tablet (75 mcg total) by mouth daily. 01/11/20   Biagio Borg, MD  RELPAX 40 MG tablet TAKE 1 TABLET DAILY AS NEEDED FOR MIGRAINE. 08/18/19   Biagio Borg, MD    Family History Family History  Problem Relation Age of Onset  . Alcohol abuse Mother   . Alcohol abuse Father   . Heart attack Father        died suddenly at home with presumed MI  . Colon cancer Neg Hx     Social History Social History   Tobacco Use  . Smoking status: Never Smoker  . Smokeless tobacco: Never Used  . Tobacco comment: quit 30 years ago  Substance Use Topics  . Alcohol use: No  . Drug use: No     Allergies   Sulfa antibiotics and Sulfonamide derivatives   Review of Systems Review of Systems  Constitutional: Negative for activity change, appetite change, chills, fatigue and fever.  HENT: Positive for congestion, rhinorrhea, sinus pressure and sore throat. Negative for ear pain and trouble swallowing.   Eyes: Negative for discharge and redness.  Respiratory: Positive for cough. Negative for chest tightness and shortness of breath.   Cardiovascular: Negative for chest pain.  Gastrointestinal: Negative for abdominal pain, diarrhea, nausea and vomiting.  Musculoskeletal: Negative for myalgias.  Skin: Negative for rash.  Neurological: Negative for dizziness, light-headedness and headaches.     Physical Exam Triage Vital Signs ED  Triage Vitals  Enc Vitals Group     BP      Pulse      Resp      Temp      Temp src      SpO2      Weight      Height      Head Circumference      Peak Flow      Pain Score      Pain Loc      Pain Edu?      Excl. in [place]?    No data found.  Updated Vital Signs BP 123/79 (BP Location: Left Arm)   Pulse 68   Temp 99.9 F (37.7 C) (Oral)   Resp 18   SpO2 98%   Visual Acuity Right Eye Distance:   Left Eye Distance:   Bilateral Distance:  Right Eye Near:   Left Eye Near:    Bilateral Near:     Physical Exam Vitals and nursing note reviewed.  Constitutional:      Appearance: She is well-developed and well-nourished.     Comments: No acute distress  HENT:     Head: Normocephalic and atraumatic.     Ears:     Comments: Bilateral ears without tenderness to palpation of external auricle, tragus and mastoid, EAC's without erythema or swelling, TM's with good bony landmarks and cone of light. Non erythematous.     Nose: Nose normal.     Mouth/Throat:     Comments: Oral mucosa pink and moist, no tonsillar enlargement or exudate. Posterior pharynx patent and nonerythematous, no uvula deviation or swelling. Normal phonation. Eyes:     Conjunctiva/sclera: Conjunctivae normal.  Cardiovascular:     Rate and Rhythm: Normal rate.  Pulmonary:     Effort: Pulmonary effort is normal. No respiratory distress.     Comments: Breathing comfortably at rest, CTABL, no wheezing, rales or other adventitious sounds auscultated Abdominal:     General: There is no distension.  Musculoskeletal:        General: Normal range of motion.     Cervical back: Neck supple.  Skin:    General: Skin is warm and dry.  Neurological:     Mental Status: She is alert and oriented to person, place, and time.  Psychiatric:        Mood and Affect: Mood and affect normal.      UC Treatments / Results  Labs (all labs ordered are listed, but only abnormal results are displayed) Labs Reviewed - No  data to display  EKG   Radiology No results found.  Procedures Procedures (including critical care time)  Medications Ordered in UC Medications - No data to display  Initial Impression / Assessment and Plan / UC Course  I have reviewed the triage vital signs and the nursing notes.  Pertinent labs & imaging results that were available during my care of the patient were reviewed by me and considered in my medical decision making (see chart for details).     URI symptoms and cough 2 to 3 weeks, exam reassuring, has previously done well with azithromycin, will provide Z-Pak, Tessalon for coughing continue symptomatic and supportive care for congestion and cough.  Rest and fluids.  Discussed strict return precautions. Patient verbalized understanding and is agreeable with plan.  Final Clinical Impressions(s) / UC Diagnoses   Final diagnoses:  Lower respiratory infection (e.g., bronchitis, pneumonia, pneumonitis, pulmonitis)     Discharge Instructions     Begin Z-Pak Tessalon for cough Continue Mucinex, please start daily Claritin Rest and fluids Honey and tea Follow-up with nonimprovement or worsening    ED Prescriptions    Medication Sig Dispense Auth. Provider   azithromycin (ZITHROMAX) 250 MG tablet Take 1 tablet (250 mg total) by mouth daily. Take first 2 tablets together, then 1 every day until finished. 6 tablet Rielyn Krupinski C, PA-C   benzonatate (TESSALON) 200 MG capsule Take 1 capsule (200 mg total) by mouth 3 (three) times daily as needed for up to 7 days for cough. 28 capsule Duante Arocho, [place] C, PA-C     PDMP not reviewed this encounter.   Janith Lima, PA-C 01/16/20 1041

## 2020-01-16 NOTE — Discharge Instructions (Signed)
Begin Z-Pak Tessalon for cough Continue Mucinex, please start daily Claritin Rest and fluids Honey and tea Follow-up with nonimprovement or worsening

## 2020-01-16 NOTE — ED Triage Notes (Signed)
Pt here with URI sx and sinus congestion; pt declines covid testing at this time; pt sts started after blowing leaves

## 2020-01-18 ENCOUNTER — Other Ambulatory Visit: Payer: 59

## 2020-01-18 NOTE — Telephone Encounter (Signed)
Ok to add nasacort otc for possible allergies, and consider covid testing if feeling poorly or fever

## 2020-01-18 NOTE — Telephone Encounter (Signed)
Sent to Dr. John. 

## 2020-01-19 NOTE — Telephone Encounter (Signed)
LDVM for pt of Dr. Gwynn Burly advice.

## 2020-01-25 ENCOUNTER — Telehealth: Payer: Self-pay | Admitting: Internal Medicine

## 2020-01-25 MED ORDER — PREDNISONE 10 MG PO TABS: ORAL_TABLET | ORAL | 0 refills | Status: DC

## 2020-01-25 NOTE — Telephone Encounter (Signed)
Patient states Dr. Jenny Reichmann is aware of her back issues and she has done something to hurt her back again and is wondering if he would send her in some prednisone to help with it. Patient denies appointment at this time because she was seen 12.06.21 [place], [place] Phone:  [phone]  Fax:  [phone]

## 2020-01-25 NOTE — Telephone Encounter (Signed)
Sent to Dr. John. 

## 2020-01-25 NOTE — Telephone Encounter (Signed)
Ok done to [place]

## 2020-02-04 ENCOUNTER — Other Ambulatory Visit: Payer: Self-pay | Admitting: Family Medicine

## 2020-02-04 ENCOUNTER — Telehealth: Payer: Self-pay | Admitting: Internal Medicine

## 2020-02-04 DIAGNOSIS — D649 Anemia, unspecified: Secondary | ICD-10-CM

## 2020-02-04 DIAGNOSIS — R7989 Other specified abnormal findings of blood chemistry: Secondary | ICD-10-CM

## 2020-02-04 NOTE — Telephone Encounter (Signed)
Pt has a lab appt scheduled for "thyroid and blood count" on 02/11/2020, but no orders. Looks like she made this appt after her visit with you on 01/11/2020.

## 2020-02-04 NOTE — Telephone Encounter (Signed)
Ok labs ordered 

## 2020-02-08 ENCOUNTER — Telehealth (INDEPENDENT_AMBULATORY_CARE_PROVIDER_SITE_OTHER): Payer: 59 | Admitting: Family

## 2020-02-08 ENCOUNTER — Telehealth: Payer: 59 | Admitting: Internal Medicine

## 2020-02-08 ENCOUNTER — Other Ambulatory Visit: Payer: Self-pay

## 2020-02-08 DIAGNOSIS — J019 Acute sinusitis, unspecified: Secondary | ICD-10-CM | POA: Diagnosis not present

## 2020-02-08 MED ORDER — PREDNISONE 20 MG PO TABS: 20.0000 mg | ORAL_TABLET | Freq: Every day | ORAL | 0 refills | Status: DC

## 2020-02-08 MED ORDER — AMOXICILLIN-POT CLAVULANATE 875-125 MG PO TABS: 1.0000 | ORAL_TABLET | Freq: Two times a day (BID) | ORAL | 0 refills | Status: AC

## 2020-02-08 NOTE — Progress Notes (Signed)
[name] is a 60 y.o. female with the following history as recorded in EpicCare:  Patient Active Problem List   Diagnosis Date Noted  . Renal stone 01/11/2020  . Normocytic anemia 01/11/2020  . Right wrist injury 07/01/2019  . Patellofemoral arthritis of left knee 07/01/2019  . Right rotator cuff tear 11/13/2018  . GERD (gastroesophageal reflux disease) 11/07/2018  . Lisfranc's sprain, left, initial encounter 11/12/2017  . RUQ pain 04/25/2017  . Rash 10/20/2016  . Nonallopathic lesion of cervical region 08/30/2016  . Nonallopathic lesion of thoracic region 08/30/2016  . Nonallopathic lesion of rib cage 08/30/2016  . Venous (peripheral) insufficiency 10/25/2015  . 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 Bursitis of right shoulder 06/07/2015  . Left rotator cuff tear 06/07/2015  . Trigger middle finger of right hand 06/07/2015  . Hearing loss 10/07/2014  . Anemia, iron deficiency 02/11/2014  . Acute upper respiratory infection 02/09/2014  . Fatigue 02/09/2014  . Greater trochanteric bursitis 09/09/2013  . Other malaise and fatigue 08/18/2013  . Right hip pain 08/18/2013  . Insomnia 07/20/2010  . Lumbar disc disease 07/20/2010  . Cervical disc disease 07/20/2010  . Vitamin D deficiency 07/20/2010  . Iron deficiency 07/20/2010  . Osteoporosis 07/20/2010  . 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 Encounter for well adult exam with abnormal findings 07/18/2010  . Palpitations 06/06/2010  . Anal fissure 10/04/2009  . RECTAL BLEEDING 10/04/2009  . NAUSEA 10/04/2009  . BACK PAIN 04/11/2009  . FRACTURE, FOOT 11/12/2008  . HLD (hyperlipidemia) 03/12/2007  . Anxiety state 03/12/2007  . COMMON MIGRAINE 03/12/2007  . ALLERGIC RHINITIS 03/12/2007  . IBS 03/12/2007  . Pain in thoracic spine 03/12/2007  . LOW BACK PAIN 03/12/2007  . PERIPHERAL EDEMA 03/12/2007  . Hypothyroidism 11/29/2006  . Headache(784.0) 11/29/2006    Current Outpatient Medications  Medication Sig  Dispense Refill  . amoxicillin-clavulanate (AUGMENTIN) 875-125 MG tablet Take 1 tablet by mouth 2 (two) times daily for 10 days. 20 tablet 0  . predniSONE (DELTASONE) 20 MG tablet Take 1 tablet (20 mg total) by mouth daily with breakfast. 5 tablet 0  . acetaminophen (TYLENOL) 500 MG tablet Take 1,000 mg by mouth every 6 (six) hours as needed for mild pain, moderate pain or headache.    . denosumab (PROLIA) 60 MG/ML SOLN injection Inject 1 Dose into the skin every 6 (six) months.    . docusate sodium (COLACE) 100 MG capsule Take 200 mg by mouth daily.    Marland Kitchen levothyroxine (SYNTHROID) 75 MCG tablet Take 1 tablet (75 mcg total) by mouth daily. 90 tablet 3  . RELPAX 40 MG tablet TAKE 1 TABLET DAILY AS NEEDED FOR MIGRAINE. 6 tablet 0  . Vitamin D, Ergocalciferol, (DRISDOL) 1.25 MG (50000 UNIT) CAPS capsule TAKE ONE CAPSULE ONCE A WEEK. 4 capsule 0   No current facility-administered medications for this visit.    Allergies: Sulfa antibiotics and Sulfonamide derivatives  Past Medical History:  Diagnosis Date  . Allergic rhinitis   . Anemia    history of anemia secondary to fibroid sx  . Anxiety and depression   . Cervical disc disease 07/20/2010  . Cyst of ovary    bilateral  . Foot fracture, left    hx of  . Hyperlipidemia   . Hypothyroidism   . IBS (irritable bowel syndrome)   . Iron deficiency 07/20/2010  . Low back pain   . Lumbar disc disease 07/20/2010  . Osteoporosis 07/20/2010  . Post-operative nausea and vomiting   .  Renal stone 01/11/2020  . 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 Vitamin D deficiency 07/20/2010    Past Surgical History:  Procedure Laterality Date  . COLONOSCOPY    . EXTRACORPOREAL SHOCK WAVE LITHOTRIPSY Left 11/02/2019   Procedure: EXTRACORPOREAL SHOCK WAVE LITHOTRIPSY (ESWL);  Surgeon: Heloise Purpura, MD;  Location[facility];  Service: Urology;  Laterality: Left;  . fibroid sx    . right lateral epicondylar    . TRIGGER FINGER RELEASE  2016  . VAGINAL  HYSTERECTOMY    . WRIST FRACTURE SURGERY Left 05/25/2014    Family History  Problem Relation Age of Onset  . Alcohol abuse Mother   . Alcohol abuse Father   . Heart attack Father        died suddenly at home with presumed MI  . Colon cancer Neg Hx     Social History   Tobacco Use  . Smoking status: Never Smoker  . Smokeless tobacco: Never Used  . Tobacco comment: quit 30 years ago  Substance Use Topics  . Alcohol use: No    Subjective:   I connected with Ala Dach on 02/08/20 at  3:40 PM EST by a video enabled telemedicine application and verified that I am speaking with the correct person using two identifiers.   I discussed the limitations of evaluation and management by telemedicine and the availability of in person appointments. The patient expressed understanding and agreed to proceed. Provider in office/ patient is at home; provider and patient are only 2 people on video call.   Patient is concerned for possible sinus infection; complaining of bilateral ear pain/ teeth pain/ increased migraine symptoms; was seen at [facility] on [facility]/11 and treated with Z-pak for possible bronchitis; feels that sometime after that, she developed COVID- did not get confirmatory test- just assumed based on symptoms ( fever, cough/ loss of sense of taste/ smell);      Objective:  There were no vitals filed for this visit.  General: Well developed, well nourished, in no acute distress  Head: Normocephalic and atraumatic  Lungs: Respirations unlabored;  Neurologic: Alert and oriented; speech intact; face symmetrical;   Assessment:  1. Acute sinusitis, recurrence not specified, unspecified location     Plan:  Rx for Augmentin 875 mg bid x 10 days, Rx for Prednisone 20 mg qd x 5 days; increase fluids, rest and follow- up worse, no better.   No follow-ups on file.  No orders of the defined types were placed in this encounter.   Requested Prescriptions   Signed Prescriptions Disp Refills   . amoxicillin-clavulanate (AUGMENTIN) 875-125 MG tablet 20 tablet 0    Sig: Take 1 tablet by mouth 2 (two) times daily for 10 days.  . predniSONE (DELTASONE) 20 MG tablet 5 tablet 0    Sig: Take 1 tablet (20 mg total) by mouth daily with breakfast.

## 2020-02-11 ENCOUNTER — Other Ambulatory Visit: Payer: Self-pay

## 2020-02-11 ENCOUNTER — Encounter: Payer: Self-pay | Admitting: Internal Medicine

## 2020-02-11 ENCOUNTER — Other Ambulatory Visit (INDEPENDENT_AMBULATORY_CARE_PROVIDER_SITE_OTHER): Payer: 59

## 2020-02-11 DIAGNOSIS — R7989 Other specified abnormal findings of blood chemistry: Secondary | ICD-10-CM | POA: Diagnosis not present

## 2020-02-11 DIAGNOSIS — D649 Anemia, unspecified: Secondary | ICD-10-CM | POA: Diagnosis not present

## 2020-02-11 LAB — CBC WITH DIFFERENTIAL/PLATELET
Basophils Absolute: 0.1 10*3/uL (ref 0.0–0.1)
Basophils Relative: 1 % (ref 0.0–3.0)
Eosinophils Absolute: 0.2 10*3/uL (ref 0.0–0.7)
Eosinophils Relative: 2.4 % (ref 0.0–5.0)
HCT: 37.4 % (ref 36.0–46.0)
Hemoglobin: 12.5 g/dL (ref 12.0–15.0)
Lymphocytes Relative: 43.4 % (ref 12.0–46.0)
Lymphs Abs: 3.1 10*3/uL (ref 0.7–4.0)
MCHC: 33.5 g/dL (ref 30.0–36.0)
MCV: 92.4 fl (ref 78.0–100.0)
Monocytes Absolute: 0.8 10*3/uL (ref 0.1–1.0)
Monocytes Relative: 11.2 % (ref 3.0–12.0)
Neutro Abs: 3 10*3/uL (ref 1.4–7.7)
Neutrophils Relative %: 42 % — ABNORMAL LOW (ref 43.0–77.0)
Platelets: 220 10*3/uL (ref 150.0–400.0)
RBC: 4.05 Mil/uL (ref 3.87–5.11)
RDW: 14.6 % (ref 11.5–15.5)
WBC: 7.1 10*3/uL (ref 4.0–10.5)

## 2020-02-11 LAB — TSH: TSH: 2.89 u[IU]/mL (ref 0.35–4.50)

## 2020-03-01 DIAGNOSIS — M25532 Pain in left wrist: Secondary | ICD-10-CM | POA: Insufficient documentation

## 2020-03-07 ENCOUNTER — Other Ambulatory Visit: Payer: Self-pay | Admitting: Internal Medicine

## 2020-03-07 DIAGNOSIS — Z0000 Encounter for general adult medical examination without abnormal findings: Secondary | ICD-10-CM

## 2020-03-07 DIAGNOSIS — E538 Deficiency of other specified B group vitamins: Secondary | ICD-10-CM

## 2020-03-07 DIAGNOSIS — E559 Vitamin D deficiency, unspecified: Secondary | ICD-10-CM

## 2020-03-14 ENCOUNTER — Telehealth: Payer: Self-pay | Admitting: Internal Medicine

## 2020-03-14 NOTE — Telephone Encounter (Signed)
Patient calling upset because she was told to never call about scheduling a prolia shot we would reach out to her and she is now overdue for the prolia shot. I assume we need to verify her benefits?  [phone]

## 2020-03-30 NOTE — Telephone Encounter (Signed)
Patient called and was wondering what the status was for her to get her Prolia injection. Please call [phone]

## 2020-04-04 NOTE — Telephone Encounter (Signed)
PA obtained for [place]   [phone] Benjaman Pott MD office outpatient Approved Auth# [id number] Effective 2.28.22-2.28.23  [facility] will ship the medication to [place] from the specialty pharmacy [name] is now awaiting [place] to call back to confirm her information  Patient has $5000 deductible, needs to enroll in Prolia co-pay card and schedule appt

## 2020-04-07 ENCOUNTER — Other Ambulatory Visit: Payer: Self-pay | Admitting: Family Medicine

## 2020-04-11 ENCOUNTER — Telehealth: Payer: Self-pay | Admitting: Family Medicine

## 2020-04-11 ENCOUNTER — Other Ambulatory Visit: Payer: Self-pay

## 2020-04-11 MED ORDER — GABAPENTIN 100 MG PO CAPS: 200.0000 mg | ORAL_CAPSULE | Freq: Every day | ORAL | 3 refills | Status: DC

## 2020-04-11 NOTE — Telephone Encounter (Signed)
   Please call patient to discuss Prolia benefit

## 2020-04-11 NOTE — Telephone Encounter (Signed)
Patient called asking if Dr Tamala Julian would be willing to refill her Gabapentin. She said that she does not usually take very many (and let her past refills expire) but said that she thinks this would be helpful at this time.  Please advise.

## 2020-04-11 NOTE — Telephone Encounter (Signed)
[facility]  Prolia being delivered on Wednesday 3.9.21 to [facility] needed upon delivery   [phone]  Patient can now be scheduled.

## 2020-04-11 NOTE — Telephone Encounter (Signed)
Called patient to notify of rx refill.

## 2020-04-13 NOTE — Telephone Encounter (Signed)
Called patient to let her know her Prolia arrived from [facility].  Patient will contact us when she is ready to schedule her Prolia

## 2020-04-19 ENCOUNTER — Other Ambulatory Visit: Payer: Self-pay | Admitting: Internal Medicine

## 2020-05-04 ENCOUNTER — Other Ambulatory Visit: Payer: Self-pay

## 2020-05-04 ENCOUNTER — Ambulatory Visit (INDEPENDENT_AMBULATORY_CARE_PROVIDER_SITE_OTHER): Payer: 59

## 2020-05-04 DIAGNOSIS — M810 Age-related osteoporosis without current pathological fracture: Secondary | ICD-10-CM | POA: Diagnosis not present

## 2020-05-04 MED ORDER — DENOSUMAB 60 MG/ML ~~LOC~~ SOSY
60.0000 mg | PREFILLED_SYRINGE | Freq: Once | SUBCUTANEOUS | Status: AC
Start: 2020-05-04 — End: 2020-05-04
  Administered 2020-05-04: 60 mg via SUBCUTANEOUS

## 2020-05-04 NOTE — Progress Notes (Signed)
Pt here for Prolia injection per Dr Jenny Reichmann.  Prolia 60mg  given subcutaneous, and pt tolerated injection well.  Pt states she will call to schedule next Prolia.  Aware to schedule after 11/04/20.

## 2020-05-18 IMAGING — DX DG FOOT COMPLETE 3+V*L*
3 series · 3 of 3 positions shown · non-contrast
Comparison: 11/06/2017.

CLINICAL DATA: 57-year-old who fell approximately 3 weeks ago and
injured the LEFT foot. Subsequent encounter.

EXAM:
LEFT FOOT - COMPLETE 3+ VIEW

[foot ap]
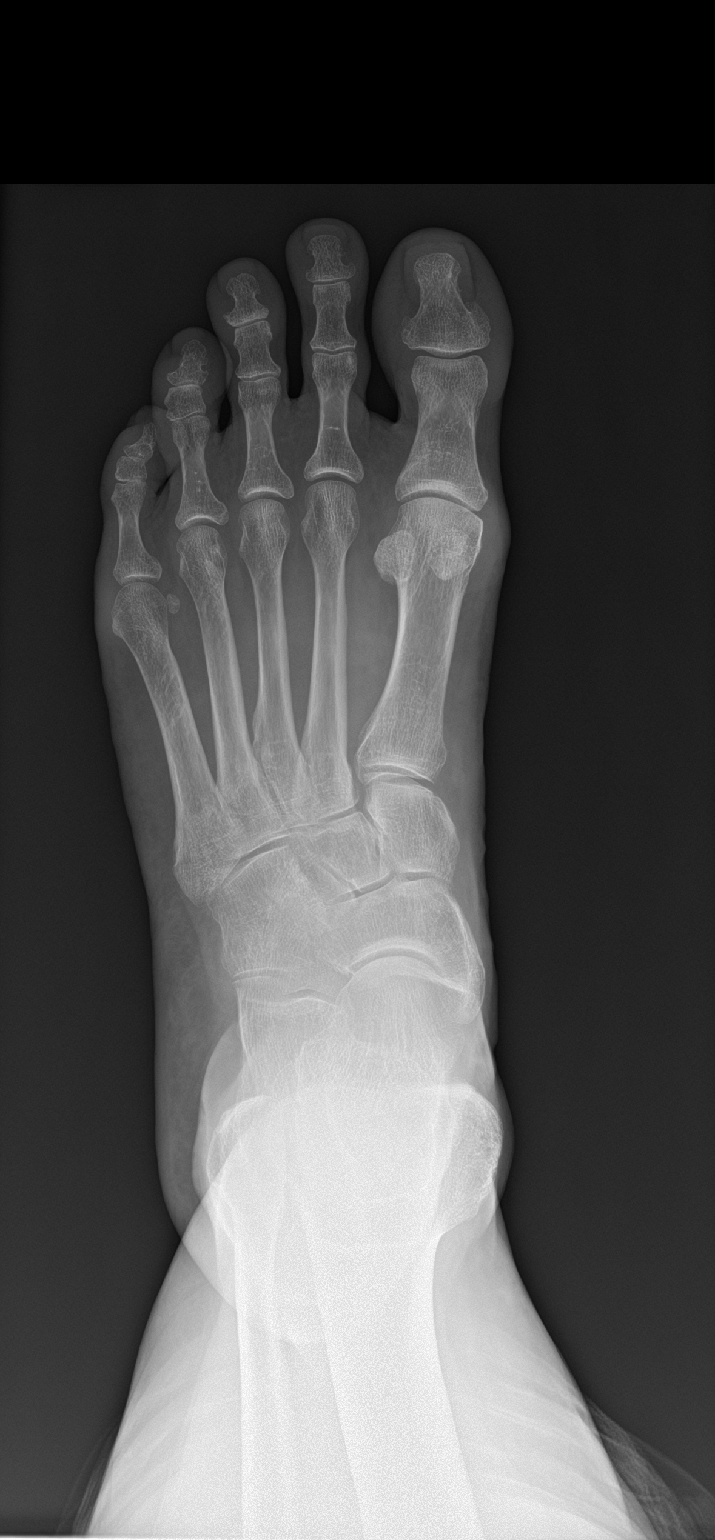

[foot obl]
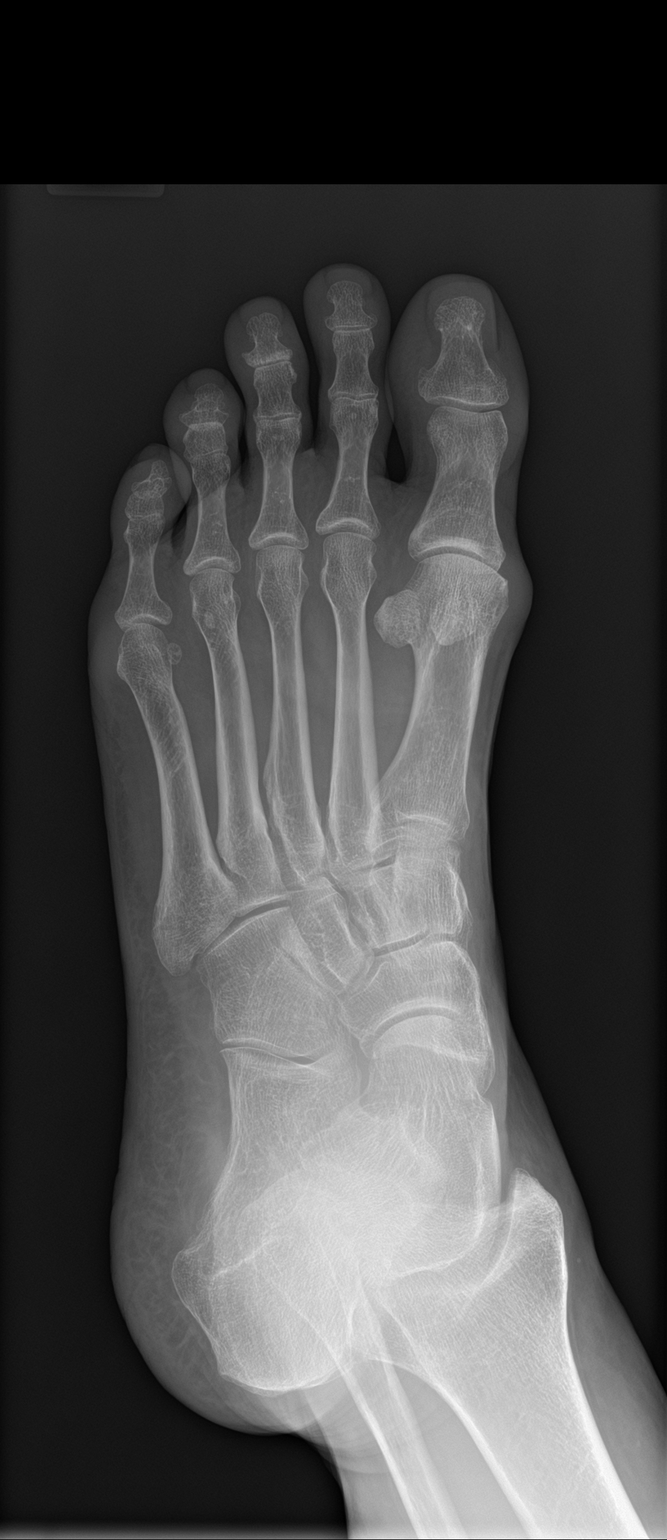

[foot lat]
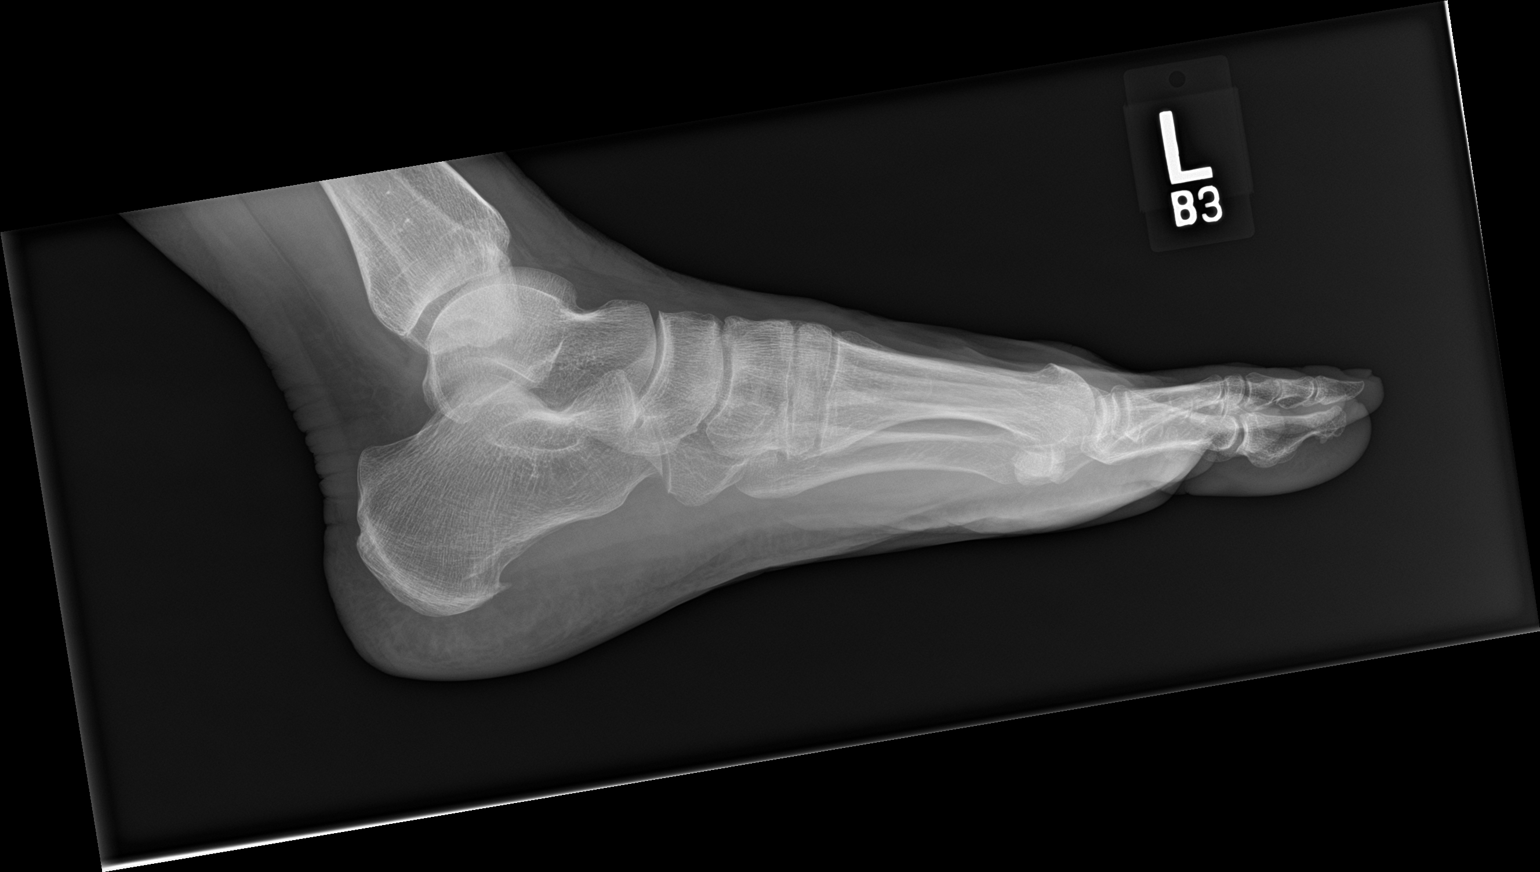

[3 of 3 positions shown; findings below may reference images not displayed]

FINDINGS: No evidence of acute, subacute or healed fractures. Well-preserved
joint spaces. Relatively well-preserved bone mineral density. Very
small plantar calcaneal spur. No other intrinsic osseous
abnormality. No change since the examination 6 days ago.
IMPRESSION: No acute or subacute osseous abnormality.

## 2020-05-31 DIAGNOSIS — M5416 Radiculopathy, lumbar region: Secondary | ICD-10-CM | POA: Insufficient documentation

## 2020-06-02 ENCOUNTER — Telehealth: Payer: Self-pay | Admitting: Family Medicine

## 2020-06-02 ENCOUNTER — Other Ambulatory Visit: Payer: Self-pay

## 2020-06-02 DIAGNOSIS — M2550 Pain in unspecified joint: Secondary | ICD-10-CM

## 2020-06-02 MED ORDER — VITAMIN D (ERGOCALCIFEROL) 1.25 MG (50000 UNIT) PO CAPS: ORAL_CAPSULE | ORAL | 0 refills | Status: DC

## 2020-06-02 NOTE — Telephone Encounter (Signed)
[name] called requesting a refill on Vitamin D, Ergocalciferol, (DRISDOL) 1.25 MG (50000 UNIT) CAPS capsule.

## 2020-06-02 NOTE — Telephone Encounter (Signed)
Rx refilled and patient notified. °

## 2020-06-07 ENCOUNTER — Encounter: Payer: Self-pay | Admitting: Internal Medicine

## 2020-06-07 ENCOUNTER — Telehealth: Payer: 59 | Admitting: Internal Medicine

## 2020-06-07 ENCOUNTER — Telehealth (INDEPENDENT_AMBULATORY_CARE_PROVIDER_SITE_OTHER): Payer: 59 | Admitting: Internal Medicine

## 2020-06-07 DIAGNOSIS — J039 Acute tonsillitis, unspecified: Secondary | ICD-10-CM

## 2020-06-07 MED ORDER — HYDROCOD POLST-CPM POLST ER 10-8 MG/5ML PO SUER
5.0000 mL | Freq: Two times a day (BID) | ORAL | 0 refills | Status: DC | PRN
Start: 2020-06-07 — End: 2020-07-24

## 2020-06-07 MED ORDER — AMOXICILLIN-POT CLAVULANATE 875-125 MG PO TABS: 1.0000 | ORAL_TABLET | Freq: Two times a day (BID) | ORAL | 0 refills | Status: DC

## 2020-06-07 NOTE — Progress Notes (Signed)
Virtual Visit via Video Note  I connected with [name] on 06/07/20 at  3:00 PM EDT by a video enabled telemedicine application and verified that I am speaking with the correct person using two identifiers.   I discussed the limitations of evaluation and management by telemedicine and the availability of in person appointments. The patient expressed understanding and agreed to proceed.  Present for the visit:  Myself, Dr Billey Gosling, Corinna Capra.  The patient is currently at home and I am in the office.    No referring provider.    History of Present Illness: This is an acute visit for cold symptoms.    Her symptoms started > 1 week ago.  She thinks it is related to the pollen.  She states subjective fever, fatigue, thick yellow nasal mucus, ear pain, sinus pain, sore throat, hoarseness, cough, myalgias, and headaches.   She tested herself for covid 1-2 days ago and it was negative.  Both her grandkids are sick.    She has a long history of pharyngitis/tonsilitis and sinus infections - used to see Dr Claria Dice.     Review of Systems  Constitutional: Positive for fever (subjective) and malaise/fatigue.  HENT: Positive for congestion (thick yellow mucus), ear pain, sinus pain and sore throat.        Hoarseness  Respiratory: Positive for cough. Negative for shortness of breath and wheezing.   Musculoskeletal: Positive for myalgias.  Neurological: Positive for headaches.      Social History   Socioeconomic History  . Marital status: Married    Spouse name: Not on file  . Number of children: Not on file  . Years of education: Not on file  . Highest education level: Not on file  Occupational History  . Occupation: cna  Tobacco Use  . Smoking status: Never Smoker  . Smokeless tobacco: Never Used  . Tobacco comment: quit 30 years ago  Substance and Sexual Activity  . Alcohol use: No  . Drug use: No  . Sexual activity: Not on file  Other Topics Concern  . 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         Social Determinants of Health   Financial Resource Strain: Not on file  Food Insecurity: Not on file  Transportation Needs: Not on file  Physical Activity: Not on file  Stress: Not on file  Social Connections: Not on file     Observations/Objective: Appears well in NAD Breathing normally  Assessment and Plan:  Tonsillitis: Acute covid test negative Symptoms c/w tonsilitis, which she has had several times in the past Start augmentin 875-125 mg bid x 10 days tussionex cough syrup bid prn otc cold medications Call if no improvement   Follow Up Instructions:    I discussed the assessment and treatment plan with the patient. The patient was provided an opportunity to ask questions and all were answered. The patient agreed with the plan and demonstrated an understanding of the instructions.   The patient was advised to call back or seek an in-person evaluation if the symptoms worsen or if the condition fails to improve as anticipated.    Binnie Rail, MD

## 2020-06-17 ENCOUNTER — Telehealth: Payer: Self-pay | Admitting: Internal Medicine

## 2020-06-17 NOTE — Telephone Encounter (Signed)
Patient wondering if she can do a transfer of care from Dr. Jenny Reichmann to Dr. Quay Burow because her husband is also transferring care. Let me know, Thank you!

## 2020-06-17 NOTE — Telephone Encounter (Signed)
ok 

## 2020-06-17 NOTE — Telephone Encounter (Signed)
Ok with me 

## 2020-06-21 NOTE — Telephone Encounter (Signed)
LVM to make appointment

## 2020-07-24 ENCOUNTER — Ambulatory Visit
Admission: EM | Admit: 2020-07-24 | Discharge: 2020-07-24 | Disposition: A | Discharge status: Home / Self Care | Payer: 59 | Attending: Physician Assistant | Admitting: Physician Assistant

## 2020-07-24 ENCOUNTER — Other Ambulatory Visit: Payer: Self-pay

## 2020-07-24 DIAGNOSIS — J019 Acute sinusitis, unspecified: Secondary | ICD-10-CM | POA: Diagnosis not present

## 2020-07-24 DIAGNOSIS — R059 Cough, unspecified: Secondary | ICD-10-CM | POA: Diagnosis not present

## 2020-07-24 MED ORDER — AMOXICILLIN-POT CLAVULANATE 875-125 MG PO TABS: 1.0000 | ORAL_TABLET | Freq: Two times a day (BID) | ORAL | 0 refills | Status: DC

## 2020-07-24 MED ORDER — PSEUDOEPH-BROMPHEN-DM 30-2-10 MG/5ML PO SYRP: 10.0000 mL | ORAL_SOLUTION | Freq: Four times a day (QID) | ORAL | 0 refills | Status: DC | PRN

## 2020-07-24 MED ORDER — ALBUTEROL SULFATE HFA 108 (90 BASE) MCG/ACT IN AERS: 1.0000 | INHALATION_SPRAY | Freq: Four times a day (QID) | RESPIRATORY_TRACT | 0 refills | Status: DC | PRN

## 2020-07-24 NOTE — ED Provider Notes (Signed)
[facility]    CSN: [id number] Arrival date & time: 07/24/20  0807      History   Chief Complaint Chief Complaint  Patient presents with   Cough   Fever   Otalgia   Fatigue    HPI [name] is a 60 y.o. female.   Pt complains of cough, congestion, bilateral ear pressure that started about 2 weeks ago.  She reports COVID test negative.  Reports green/yellowish phlegm. She is currently taking Mucinex, sudafed, and tylenol with minimal improvement.  Reports she was having fever/chills, that resolved about three days ago.     Past Medical History:  Diagnosis Date   Allergic rhinitis    Anemia    history of anemia secondary to fibroid sx   Anxiety and depression    Cervical disc disease 07/20/2010   Cyst of ovary    bilateral   Foot fracture, left    hx of   Hyperlipidemia    Hypothyroidism    IBS (irritable bowel syndrome)    Iron deficiency 07/20/2010   Low back pain    Lumbar disc disease 07/20/2010   Osteoporosis 07/20/2010   Post-operative nausea and vomiting    Renal stone 01/11/2020   Symptomatic PVCs 07/20/2010   Vitamin D deficiency 07/20/2010    Patient Active Problem List   Diagnosis Date Noted   Renal stone 01/11/2020   Normocytic anemia 01/11/2020   Right wrist injury 07/01/2019   Patellofemoral arthritis of left knee 07/01/2019   Right rotator cuff tear 11/13/2018   GERD (gastroesophageal reflux disease) 11/07/2018   Lisfranc's sprain, left, initial encounter 11/12/2017   RUQ pain 04/25/2017   Rash 10/20/2016   Nonallopathic lesion of cervical region 08/30/2016   Nonallopathic lesion of thoracic region 08/30/2016   Nonallopathic lesion of rib cage 08/30/2016   Venous (peripheral) insufficiency 10/25/2015   Chronic meniscal tear of knee 07/05/2015   Bursitis of right shoulder 06/07/2015   Left rotator cuff tear 06/07/2015   Trigger middle finger of right hand 06/07/2015   Hearing loss 10/07/2014   Anemia, iron deficiency  02/11/2014   Acute upper respiratory infection 02/09/2014   Fatigue 02/09/2014   Greater trochanteric bursitis 09/09/2013   Other malaise and fatigue 08/18/2013   Right hip pain 08/18/2013   Insomnia 07/20/2010   Lumbar disc disease 07/20/2010   Cervical disc disease 07/20/2010   Vitamin D deficiency 07/20/2010   Iron deficiency 07/20/2010   Osteoporosis 07/20/2010   Symptomatic PVCs 07/20/2010   Encounter for well adult exam with abnormal findings 07/18/2010   Palpitations 06/06/2010   Anal fissure 10/04/2009   RECTAL BLEEDING 10/04/2009   NAUSEA 10/04/2009   BACK PAIN 04/11/2009   FRACTURE, FOOT 11/12/2008   HLD (hyperlipidemia) 03/12/2007   Anxiety state 03/12/2007   COMMON MIGRAINE 03/12/2007   ALLERGIC RHINITIS 03/12/2007   IBS 03/12/2007   Pain in thoracic spine 03/12/2007   LOW BACK PAIN 03/12/2007   PERIPHERAL EDEMA 03/12/2007   Hypothyroidism 11/29/2006   Headache(784.0) 11/29/2006    Past Surgical History:  Procedure Laterality Date   COLONOSCOPY     EXTRACORPOREAL SHOCK WAVE LITHOTRIPSY Left 11/02/2019   Procedure: EXTRACORPOREAL SHOCK WAVE LITHOTRIPSY (ESWL);  Surgeon: Raynelle Bring, MD;  Location: [facility];  Service: Urology;  Laterality: Left;   fibroid sx     right lateral epicondylar     TRIGGER FINGER RELEASE  2016   VAGINAL HYSTERECTOMY     WRIST FRACTURE SURGERY Left 05/25/2014  OB History   No obstetric history on file.      Home Medications    Prior to Admission medications   Medication Sig Start Date End Date Taking? Authorizing Provider  acetaminophen (TYLENOL) 500 MG tablet Take 1,000 mg by mouth every 6 (six) hours as needed for mild pain, moderate pain or headache.    [provider]  amoxicillin-clavulanate (AUGMENTIN) 875-125 MG tablet Take 1 tablet by mouth 2 (two) times daily. 06/07/20   Binnie Rail, MD  chlorpheniramine-HYDROcodone (TUSSIONEX PENNKINETIC ER) 10-8 MG/5ML SUER Take 5 mLs by mouth every  12 (twelve) hours as needed for cough. 06/07/20   Binnie Rail, MD  denosumab (PROLIA) 60 MG/ML SOLN injection Inject 1 Dose into the skin every 6 (six) months.    [provider]  docusate sodium (COLACE) 100 MG capsule Take 200 mg by mouth daily.    [provider]  gabapentin (NEURONTIN) 100 MG capsule Take 2 capsules (200 mg total) by mouth at bedtime. 04/11/20   Lyndal Pulley, DO  levothyroxine (SYNTHROID) 75 MCG tablet Take 1 tablet (75 mcg total) by mouth daily. 01/11/20   Biagio Borg, MD  RELPAX 40 MG tablet TAKE 1 TABLET DAILY AS NEEDED FOR MIGRAINE. 04/19/20   Biagio Borg, MD  Vitamin D, Ergocalciferol, (DRISDOL) 1.25 MG (50000 UNIT) CAPS capsule TAKE ONE CAPSULE ONCE A WEEK. 06/02/20   Lyndal Pulley, DO    Family History Family History  Problem Relation Age of Onset   Alcohol abuse Mother    Alcohol abuse Father    Heart attack Father        died suddenly at home with presumed MI   Colon cancer Neg Hx     Social History Social History   Tobacco Use   Smoking status: Never   Smokeless tobacco: Never   Tobacco comments:    quit 30 years ago  Substance Use Topics   Alcohol use: No   Drug use: No     Allergies   Sulfa antibiotics and Sulfonamide derivatives   Review of Systems Review of Systems  Constitutional:  Negative for chills and fever.  HENT:  Positive for congestion, ear pain, postnasal drip, rhinorrhea and sinus pressure. Negative for sore throat.   Eyes:  Negative for pain and visual disturbance.  Respiratory:  Positive for cough. Negative for shortness of breath and wheezing.   Cardiovascular:  Negative for chest pain and palpitations.  Gastrointestinal:  Negative for abdominal pain and vomiting.  Genitourinary:  Negative for dysuria and hematuria.  Musculoskeletal:  Negative for arthralgias and back pain.  Skin:  Negative for color change and rash.  Neurological:  Negative for seizures and syncope.  All other systems reviewed  and are negative.   Physical Exam Triage Vital Signs ED Triage Vitals  Enc Vitals Group     BP 07/24/20 0822 118/75     Pulse Rate 07/24/20 0822 70     Resp 07/24/20 0822 18     Temp 07/24/20 0822 98.6 F (37 C)     Temp Source 07/24/20 0822 Oral     SpO2 07/24/20 0822 96 %     Weight --      Height --      Head Circumference --      Peak Flow --      Pain Score 07/24/20 0826 4     Pain Loc --      Pain Edu? --  Excl. in [place]? --    No data found.  Updated Vital Signs BP 118/75 (BP Location: Left Arm)   Pulse 70   Temp 98.6 F (37 C) (Oral)   Resp 18   SpO2 96%   Visual Acuity Right Eye Distance:   Left Eye Distance:   Bilateral Distance:    Right Eye Near:   Left Eye Near:    Bilateral Near:     Physical Exam Vitals and nursing note reviewed.  Constitutional:      General: She is not in acute distress.    Appearance: She is well-developed.  HENT:     Head: Normocephalic and atraumatic.  Eyes:     Conjunctiva/sclera: Conjunctivae normal.  Cardiovascular:     Rate and Rhythm: Normal rate and regular rhythm.     Heart sounds: No murmur heard. Pulmonary:     Effort: Pulmonary effort is normal. No respiratory distress.     Breath sounds: Normal breath sounds.  Abdominal:     Palpations: Abdomen is soft.     Tenderness: There is no abdominal tenderness.  Musculoskeletal:     Cervical back: Neck supple.  Skin:    General: Skin is warm and dry.  Neurological:     Mental Status: She is alert.     UC Treatments / Results  Labs (all labs ordered are listed, but only abnormal results are displayed) Labs Reviewed - No data to display  EKG   Radiology No results found.  Procedures Procedures (including critical care time)  Medications Ordered in UC Medications - No data to display  Initial Impression / Assessment and Plan / UC Course  I have reviewed the triage vital signs and the nursing notes.  Pertinent labs & imaging results that were  available during my care of the patient were reviewed by me and considered in my medical decision making (see chart for details).    Sinusitis and cough.  Recommend daily flonase and allergy medication.  Augmentin and cough syrup sent to pharmacy. Vitals WNL, pt well appearing. Advised follow up with primary care physician if no improvement.  Final Clinical Impressions(s) / UC Diagnoses   Final diagnoses:  None   Discharge Instructions   None    ED Prescriptions   None    PDMP not reviewed this encounter.   Konrad Felix, PA-C 07/24/20 [phone]

## 2020-07-24 NOTE — Discharge Instructions (Addendum)
Take medications as prescribed Follow up with primary care physician if no improvement Drink plenty of fluids, rest.  Recommend daily Flonase and allergy medication like Claritin and Zyrtec

## 2020-07-24 NOTE — ED Triage Notes (Signed)
Patient presents to Urgent Care with complaints of cough, fever (2 days ago), chest congestion, bilateral ear pain since 2 weeks. Pt states symptoms have worsened. Treating symptoms with sudafed, mucinex, and tylenol. Had a negative covid test yesterday.

## 2020-07-27 ENCOUNTER — Other Ambulatory Visit: Payer: Self-pay | Admitting: Internal Medicine

## 2020-07-27 ENCOUNTER — Telehealth: Payer: Self-pay | Admitting: *Deleted

## 2020-07-27 MED ORDER — AMOXICILLIN-POT CLAVULANATE 875-125 MG PO TABS: 1.0000 | ORAL_TABLET | Freq: Two times a day (BID) | ORAL | 0 refills | Status: DC

## 2020-07-27 NOTE — Telephone Encounter (Signed)
Pt inquiring if she can take Sudafed for ear pressure.  Notified pt that her Rx syrup has Sudafed in it; pt states she has not been taking it due to nausea if she takes it.  Discussed with Curly [place], PA - notified pt that she may take OTC Sudafed (if not taking Rx syrup) along with Flonase or Nasacort to help.  Pt verbalized understanding and stated she is feeling a little better.

## 2020-08-12 ENCOUNTER — Ambulatory Visit (INDEPENDENT_AMBULATORY_CARE_PROVIDER_SITE_OTHER): Payer: 59

## 2020-08-12 ENCOUNTER — Ambulatory Visit
Admission: RE | Admit: 2020-08-12 | Discharge: 2020-08-12 | Disposition: A | Discharge status: Home / Self Care | Payer: 59 | Source: Ambulatory Visit | Attending: Internal Medicine | Admitting: Internal Medicine

## 2020-08-12 ENCOUNTER — Other Ambulatory Visit: Payer: Self-pay

## 2020-08-12 VITALS — BP 117/75 | HR 60 | Temp 98.4°F | Resp 20

## 2020-08-12 DIAGNOSIS — R0602 Shortness of breath: Secondary | ICD-10-CM

## 2020-08-12 DIAGNOSIS — J069 Acute upper respiratory infection, unspecified: Secondary | ICD-10-CM

## 2020-08-12 DIAGNOSIS — R0981 Nasal congestion: Secondary | ICD-10-CM

## 2020-08-12 MED ORDER — PREDNISONE 20 MG PO TABS: 40.0000 mg | ORAL_TABLET | Freq: Every day | ORAL | 0 refills | Status: DC

## 2020-08-12 NOTE — Progress Notes (Signed)
Patient notified. See encounter note.

## 2020-08-12 NOTE — ED Triage Notes (Signed)
Pt presents today with continued c/o of nasal congestion, ear stuffiness and cough x 3 weeks. She was seen here and given antibiotic (completed course), inhaler and cough syrup.

## 2020-08-12 NOTE — ED Provider Notes (Signed)
[facility]    CSN: [id number] Arrival date & time: 08/12/20  0840      History   Chief Complaint Chief Complaint  Patient presents with   Otalgia   Nasal Congestion    HPI [name] is a 60 y.o. female.   Patient presents for follow-up for sinus infection that was treated on 07/24/2020.  Patient reports that she is still having nasal congestion but that all other symptoms have resolved including cough.  Symptoms started 2 weeks prior to visit on 07/24/2020, so symptoms have been present for approximately 1 month.  Patient reports "pressure in her face" on sides of nose.  Denies fevers but states that she had fever when symptoms originally started approximately 1 month ago.  Patient endorses intermittent shortness of breath specifically when she is walking her dog.  Denies any current shortness of breath.  Denies chest pain.  Patient has been taking Sudafed, steroid nasal spray, and Claritin over-the-counter in addition to completing Augmentin course and cough medication.  Patient was also prescribed an inhaler at previous visit that she has been using as needed for shortness of breath.  Patient states that she has discomfort in her ears as well and feels like there is "fluid deep in her ears."  Patient has been evaluated and treated for multiple respiratory infections with Augmentin and other antibiotics over the past few months.  Patient reports that she has seen by ear, nose, throat specialist (Dr. Lucia Gaskins) but the last visit was approximately 1 year ago.  Patient states that ENT specialist suggested that she have her tonsils and adenoids removed but patient refused because she "thinks she is too old for surgery."  Patient took COVID test when symptoms first started and had negative COVID test.   Otalgia  Past Medical History:  Diagnosis Date   Allergic rhinitis    Anemia    history of anemia secondary to fibroid sx   Anxiety and depression    Cervical disc  disease 07/20/2010   Cyst of ovary    bilateral   Foot fracture, left    hx of   Hyperlipidemia    Hypothyroidism    IBS (irritable bowel syndrome)    Iron deficiency 07/20/2010   Low back pain    Lumbar disc disease 07/20/2010   Osteoporosis 07/20/2010   Post-operative nausea and vomiting    Renal stone 01/11/2020   Symptomatic PVCs 07/20/2010   Vitamin D deficiency 07/20/2010    Patient Active Problem List   Diagnosis Date Noted   Renal stone 01/11/2020   Normocytic anemia 01/11/2020   Right wrist injury 07/01/2019   Patellofemoral arthritis of left knee 07/01/2019   Right rotator cuff tear 11/13/2018   GERD (gastroesophageal reflux disease) 11/07/2018   Lisfranc's sprain, left, initial encounter 11/12/2017   RUQ pain 04/25/2017   Rash 10/20/2016   Nonallopathic lesion of cervical region 08/30/2016   Nonallopathic lesion of thoracic region 08/30/2016   Nonallopathic lesion of rib cage 08/30/2016   Venous (peripheral) insufficiency 10/25/2015   Chronic meniscal tear of knee 07/05/2015   Bursitis of right shoulder 06/07/2015   Left rotator cuff tear 06/07/2015   Trigger middle finger of right hand 06/07/2015   Hearing loss 10/07/2014   Anemia, iron deficiency 02/11/2014   Acute upper respiratory infection 02/09/2014   Fatigue 02/09/2014   Greater trochanteric bursitis 09/09/2013   Other malaise and fatigue 08/18/2013   Right hip pain 08/18/2013   Insomnia 07/20/2010   Lumbar disc  disease 07/20/2010   Cervical disc disease 07/20/2010   Vitamin D deficiency 07/20/2010   Iron deficiency 07/20/2010   Osteoporosis 07/20/2010   Symptomatic PVCs 07/20/2010   Encounter for well adult exam with abnormal findings 07/18/2010   Palpitations 06/06/2010   Anal fissure 10/04/2009   RECTAL BLEEDING 10/04/2009   NAUSEA 10/04/2009   BACK PAIN 04/11/2009   FRACTURE, FOOT 11/12/2008   HLD (hyperlipidemia) 03/12/2007   Anxiety state 03/12/2007   COMMON MIGRAINE 03/12/2007   ALLERGIC  RHINITIS 03/12/2007   IBS 03/12/2007   Pain in thoracic spine 03/12/2007   LOW BACK PAIN 03/12/2007   PERIPHERAL EDEMA 03/12/2007   Hypothyroidism 11/29/2006   Headache(784.0) 11/29/2006    Past Surgical History:  Procedure Laterality Date   COLONOSCOPY     EXTRACORPOREAL SHOCK WAVE LITHOTRIPSY Left 11/02/2019   Procedure: EXTRACORPOREAL SHOCK WAVE LITHOTRIPSY (ESWL);  Surgeon: Raynelle Bring, MD;  Location: [facility];  Service: Urology;  Laterality: Left;   fibroid sx     right lateral epicondylar     TRIGGER FINGER RELEASE  2016   VAGINAL HYSTERECTOMY     WRIST FRACTURE SURGERY Left 05/25/2014    OB History   No obstetric history on file.      Home Medications    Prior to Admission medications   Medication Sig Start Date End Date Taking? Authorizing Provider  predniSONE (DELTASONE) 20 MG tablet Take 2 tablets (40 mg total) by mouth daily for 5 days. 08/12/20 08/17/20 Yes Odis Luster, FNP  acetaminophen (TYLENOL) 500 MG tablet Take 1,000 mg by mouth every 6 (six) hours as needed for mild pain, moderate pain or headache.    [provider]  albuterol (VENTOLIN HFA) 108 (90 Base) MCG/ACT inhaler Inhale 1-2 puffs into the lungs every 6 (six) hours as needed for wheezing or shortness of breath. 07/24/20   Konrad Felix, PA-C  amoxicillin-clavulanate (AUGMENTIN) 875-125 MG tablet Take 1 tablet by mouth every 12 (twelve) hours. 07/27/20   Binnie Rail, MD  brompheniramine-pseudoephedrine-DM 30-2-10 MG/5ML syrup Take 10 mLs by mouth 4 (four) times daily as needed. 07/24/20   Konrad Felix, PA-C  denosumab (PROLIA) 60 MG/ML SOLN injection Inject 1 Dose into the skin every 6 (six) months.    [provider]  docusate sodium (COLACE) 100 MG capsule Take 200 mg by mouth daily.    [provider]  gabapentin (NEURONTIN) 100 MG capsule Take 2 capsules (200 mg total) by mouth at bedtime. 04/11/20   Lyndal Pulley, DO  levothyroxine  (SYNTHROID) 75 MCG tablet Take 1 tablet (75 mcg total) by mouth daily. 01/11/20   Biagio Borg, MD  RELPAX 40 MG tablet TAKE 1 TABLET DAILY AS NEEDED FOR MIGRAINE. 04/19/20   Biagio Borg, MD  Vitamin D, Ergocalciferol, (DRISDOL) 1.25 MG (50000 UNIT) CAPS capsule TAKE ONE CAPSULE ONCE A WEEK. 06/02/20   Lyndal Pulley, DO    Family History Family History  Problem Relation Age of Onset   Alcohol abuse Mother    Alcohol abuse Father    Heart attack Father        died suddenly at home with presumed MI   Colon cancer Neg Hx     Social History Social History   Tobacco Use   Smoking status: Never   Smokeless tobacco: Never   Tobacco comments:    quit 30 years ago  Substance Use Topics   Alcohol use: No   Drug use: No  Allergies   Sulfa antibiotics and Sulfonamide derivatives   Review of Systems Review of Systems Per HPI   Physical Exam Triage Vital Signs ED Triage Vitals  Enc Vitals Group     BP 08/12/20 0850 117/75     Pulse Rate 08/12/20 0850 60     Resp 08/12/20 0850 20     Temp 08/12/20 0850 98.4 F (36.9 C)     Temp Source 08/12/20 0850 Oral     SpO2 08/12/20 0850 97 %     Weight --      Height --      Head Circumference --      Peak Flow --      Pain Score 08/12/20 0900 4     Pain Loc --      Pain Edu? --      Excl. in [place]? --    No data found.  Updated Vital Signs BP 117/75 (BP Location: Left Arm)   Pulse 60   Temp 98.4 F (36.9 C) (Oral)   Resp 20   SpO2 97%   Visual Acuity Right Eye Distance:   Left Eye Distance:   Bilateral Distance:    Right Eye Near:   Left Eye Near:    Bilateral Near:     Physical Exam Constitutional:      Appearance: Normal appearance.  HENT:     Head: Normocephalic and atraumatic.     Right Ear: Tympanic membrane and ear canal normal. No middle ear effusion. Tympanic membrane is not erythematous.     Left Ear: Tympanic membrane and ear canal normal.  No middle ear effusion. Tympanic membrane is not  erythematous.     Nose: Congestion present. No nasal tenderness or mucosal edema.     Right Sinus: No maxillary sinus tenderness or frontal sinus tenderness.     Left Sinus: No maxillary sinus tenderness or frontal sinus tenderness.     Mouth/Throat:     Mouth: Mucous membranes are moist.     Pharynx: No posterior oropharyngeal erythema.     Tonsils: 0 on the right. 0 on the left.  Eyes:     Extraocular Movements: Extraocular movements intact.     Conjunctiva/sclera: Conjunctivae normal.  Cardiovascular:     Rate and Rhythm: Normal rate and regular rhythm.     Pulses: Normal pulses.     Heart sounds: Normal heart sounds.  Pulmonary:     Effort: Pulmonary effort is normal. No respiratory distress.     Breath sounds: Normal breath sounds. No wheezing, rhonchi or rales.  Abdominal:     General: Abdomen is flat. Bowel sounds are normal.     Palpations: Abdomen is soft.  Skin:    General: Skin is warm and dry.  Neurological:     General: No focal deficit present.     Mental Status: She is alert and oriented to person, place, and time. Mental status is at baseline.  Psychiatric:        Mood and Affect: Mood normal.        Behavior: Behavior normal.        Thought Content: Thought content normal.        Judgment: Judgment normal.     UC Treatments / Results  Labs (all labs ordered are listed, but only abnormal results are displayed) Labs Reviewed - No data to display  EKG   Radiology DG Chest 2 View  Result Date: 08/12/2020 CLINICAL DATA:  Intermittent short of breath EXAM: CHEST -  2 VIEW COMPARISON:  05/24/10 FINDINGS: Lungs are hyperinflated. Normal cardiac silhouette. No effusion, infiltrate or pneumothorax. Rounded density projecting over the RIGHT tenth rib is favored nipple shadow or potential bone lesion. No nodule seen on lateral projection. IMPRESSION: 1. No acute cardiopulmonary findings. 2. Hyperinflated lungs. 3. Indeterminate round lesion projecting over the lower  RIGHT hemithorax. Favor bone lesion or nipple shadow. Consider repeat chest x-ray with nipple markers versus CT of the thorax without contrast for further evaluation. Electronically Signed   By: Suzy Bouchard M.D.   On: 08/12/2020 10:00    Procedures Procedures (including critical care time)  Medications Ordered in UC Medications - No data to display  Initial Impression / Assessment and Plan / UC Course  I have reviewed the triage vital signs and the nursing notes.  Pertinent labs & imaging results that were available during my care of the patient were reviewed by me and considered in my medical decision making (see chart for details).     Treat with prednisone 40 mg x 5 days to decrease any inflammation with nasal congestion and help with intermittent shortness of breath.  Chest x-ray completed but was negative for any acute cardiopulmonary process.  Chest x-ray did show a rounded density that radiologist determines to possibly be a nipple shadow or bony density but recommended follow-up with repeat imaging.  Patient was notified of these results and voiced understanding.  Patient was advised to follow-up with primary care for further imaging. Patient was advised to follow-up with her ENT specialist today to make an appointment for further evaluation and management current and chronic sinus infection.  No antibiotic needed at this time due to physical exam.  Patient was educated on the importance of antibiotic stewardship.  Continue inhaler as needed for shortness of breath.  Patient advised to go to the hospital if shortness of breath becomes constant or worsens.  Discussed strict return precautions. Patient verbalized understanding and is agreeable with plan.  Final Clinical Impressions(s) / UC Diagnoses   Final diagnoses:  Acute upper respiratory infection  Nasal congestion  Shortness of breath     Discharge Instructions      You are being treated with prednisone to decrease  inflammation and nasal congestion.  Chest x-ray was completed.  We will call with results.  Please call your ear nose and throat doctor today to schedule follow-up appointment.  You may continue Sudafed, Claritin, and any other over-the-counter medications you have been taking in addition to prednisone.     ED Prescriptions     Medication Sig Dispense Auth. Provider   predniSONE (DELTASONE) 20 MG tablet Take 2 tablets (40 mg total) by mouth daily for 5 days. 10 tablet Odis Luster, FNP      PDMP not reviewed this encounter.   Odis Luster, FNP 08/12/20 1015

## 2020-08-12 NOTE — Discharge Instructions (Addendum)
You are being treated with prednisone to decrease inflammation and nasal congestion.  Chest x-ray was completed.  We will call with results.  Please call your ear nose and throat doctor today to schedule follow-up appointment.  You may continue Sudafed, Claritin, and any other over-the-counter medications you have been taking in addition to prednisone.

## 2020-08-17 ENCOUNTER — Ambulatory Visit (INDEPENDENT_AMBULATORY_CARE_PROVIDER_SITE_OTHER): Payer: 59 | Admitting: Internal Medicine

## 2020-08-17 ENCOUNTER — Other Ambulatory Visit: Payer: Self-pay

## 2020-08-17 ENCOUNTER — Ambulatory Visit (INDEPENDENT_AMBULATORY_CARE_PROVIDER_SITE_OTHER): Payer: 59

## 2020-08-17 ENCOUNTER — Encounter: Payer: Self-pay | Admitting: Internal Medicine

## 2020-08-17 VITALS — BP 122/82 | HR 61 | Temp 98.6°F | Resp 18 | Ht 67.0 in | Wt 136.0 lb

## 2020-08-17 DIAGNOSIS — R9389 Abnormal findings on diagnostic imaging of other specified body structures: Secondary | ICD-10-CM

## 2020-08-17 NOTE — Progress Notes (Signed)
Patient ID: [name], female   DOB: Jun 22, 1960, 60 y.o.   MRN: [id number]        Chief Complaint: follow up recent cxr with ? abnormality       HPI:  [name] is a 60 y.o. female here with c/o above, Pt denies cough, chiills chest pain, increased sob or doe, wheezing, orthopnea, PND, increased LE swelling, palpitations, dizziness or syncope.   Pt denies fever, wt loss, night sweats, loss of appetite, or other constitutional symptoms  though whole family it seems had some type of viral or bacterial resp illness and she had bilateral ear infections per UC        Wt Readings from Last 3 Encounters:  08/17/20 136 lb (61.7 kg)  01/11/20 128 lb (58.1 kg)  12/14/19 128 lb 9.6 oz (58.3 kg)   BP Readings from Last 3 Encounters:  08/17/20 122/82  08/12/20 117/75  07/24/20 118/75         Past Medical History:  Diagnosis Date   Allergic rhinitis    Anemia    history of anemia secondary to fibroid sx   Anxiety and depression    Cervical disc disease 07/20/2010   Cyst of ovary    bilateral   Foot fracture, left    hx of   Hyperlipidemia    Hypothyroidism    IBS (irritable bowel syndrome)    Iron deficiency 07/20/2010   Low back pain    Lumbar disc disease 07/20/2010   Osteoporosis 07/20/2010   Post-operative nausea and vomiting    Renal stone 01/11/2020   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 She has never used smokeless tobacco. She reports that she does not drink alcohol and does  not use drugs. family history includes Alcohol abuse in her father and mother; Heart attack in her father. Allergies  Allergen Reactions   Sulfa Antibiotics Other (See Comments)   Sulfonamide Derivatives Other (See Comments)    Pt unknown reaction    Current Outpatient Medications on File Prior to Visit  Medication Sig Dispense Refill   acetaminophen (TYLENOL) 500 MG tablet Take 1,000 mg by mouth every 6 (six) hours as needed for mild pain, moderate pain or headache.     albuterol (VENTOLIN HFA) 108 (90 Base) MCG/ACT inhaler Inhale 1-2 puffs into the lungs every 6 (six) hours as needed for wheezing or shortness of breath. 1 each 0   brompheniramine-pseudoephedrine-DM 30-2-10 MG/5ML syrup Take 10 mLs by mouth 4 (four) times daily as needed. 120 mL 0   denosumab (PROLIA) 60 MG/ML SOLN injection Inject 1 Dose into the skin every 6 (six) months.     docusate sodium (COLACE) 100 MG capsule Take 200 mg by mouth daily.     levothyroxine (SYNTHROID) 75 MCG tablet Take 1 tablet (75 mcg total) by mouth daily. 90 tablet 3   RELPAX 40 MG tablet TAKE 1 TABLET DAILY AS NEEDED FOR MIGRAINE. 6 tablet 0   Vitamin  D, Ergocalciferol, (DRISDOL) 1.25 MG (50000 UNIT) CAPS capsule TAKE ONE CAPSULE ONCE A WEEK. 4 capsule 0   gabapentin (NEURONTIN) 100 MG capsule Take 2 capsules (200 mg total) by mouth at bedtime. (Patient not taking: Reported on 08/17/2020) 60 capsule 3   No current facility-administered medications on file prior to visit.        ROS:  All others reviewed and negative.  Objective        PE:  BP 122/82   Pulse 61   Temp 98.6 F (37 C) (Oral)   Resp 18   Ht 5\' 7"  (1.702 m)   Wt 136 lb (61.7 kg)   SpO2 97%   BMI 21.30 kg/m                 Constitutional: Pt appears in NAD               HENT: Head: NCAT.                Right Ear: External ear normal.                 Left Ear: External ear normal.                Eyes: . Pupils are equal, round, and reactive to light. Conjunctivae and EOM  are normal               Nose: without d/c or deformity               Neck: Neck supple. Gross normal ROM               Cardiovascular: Normal rate and regular rhythm.                 Pulmonary/Chest: Effort normal and breath sounds without rales or wheezing.                              Neurological: Pt is alert. At baseline orientation, motor grossly intact               Skin: Skin is warm. No rashes, no other new lesions, LE edema - none               Psychiatric: Pt behavior is normal without agitation   Micro: none  Cardiac tracings I have personally interpreted today:  none  Pertinent Radiological findings (summarize): August 12, 2020 cxr -  IMPRESSION: 1. No acute cardiopulmonary findings. 2. Hyperinflated lungs. 3. Indeterminate round lesion projecting over the lower RIGHT hemithorax. Favor bone lesion or nipple shadow. Consider repeat chest x-ray with nipple markers versus CT of the thorax without contrast for further evaluation.   Lab Results  Component Value Date   WBC 7.1 02/11/2020   HGB 12.5 02/11/2020   HCT 37.4 02/11/2020   PLT 220.0 02/11/2020   GLUCOSE 92 01/08/2020   CHOL 208 (H) 01/08/2020   TRIG 42.0 01/08/2020   HDL 89.60 01/08/2020   LDLDIRECT 95.5 07/20/2010   LDLCALC 110 (H) 01/08/2020   ALT 11 01/08/2020   AST 18 01/08/2020   NA 143 01/08/2020   K 4.3 01/08/2020   CL 109 01/08/2020   CREATININE 0.71 01/08/2020   BUN 24 (H) 01/08/2020   CO2 27 01/08/2020   TSH 2.89 02/11/2020   Assessment/Plan:  [name] is a 60 y.o. White or Caucasian [1] female with  has a past medical history  of Allergic rhinitis, Anemia, Anxiety and depression, Cervical disc disease (07/20/2010), Cyst of ovary, Foot fracture, left, Hyperlipidemia, Hypothyroidism, IBS (irritable bowel syndrome), Iron deficiency (07/20/2010), Low back pain, Lumbar disc disease (07/20/2010), Osteoporosis (07/20/2010), Post-operative nausea and vomiting, Renal stone (01/11/2020), Symptomatic  PVCs (07/20/2010), and Vitamin D deficiency (07/20/2010).  Abnormal CXR cxr with ? Clinical significanct abnormal, exam benign, doubt malignancy or infection but will need f/u cxr  Followup: Return if symptoms worsen or fail to improve.  Cathlean Cower, MD 08/21/2020 4:07 PM [facility]

## 2020-08-17 NOTE — Patient Instructions (Signed)
Please continue all other medications as before, and refills have been done if requested.  Please have the pharmacy call with any other refills you may need.  Please keep your appointments with your specialists as you may have planned  Please go to the XRAY Department in the first floor for the x-ray testing  You will be contacted by phone if any changes need to be made immediately.  Otherwise, you will receive a letter about your results with an explanation, but please check with MyChart first.  Please remember to sign up for MyChart if you have not done so, as this will be important to you in the future with finding out test results, communicating by private email, and scheduling acute appointments online when needed.    . 

## 2020-08-18 ENCOUNTER — Encounter: Payer: Self-pay | Admitting: Internal Medicine

## 2020-08-21 ENCOUNTER — Encounter: Payer: Self-pay | Admitting: Internal Medicine

## 2020-08-21 DIAGNOSIS — R9389 Abnormal findings on diagnostic imaging of other specified body structures: Secondary | ICD-10-CM | POA: Insufficient documentation

## 2020-08-21 NOTE — Assessment & Plan Note (Signed)
cxr with ? Clinical significanct abnormal, exam benign, doubt malignancy or infection but will need f/u cxr

## 2020-08-29 ENCOUNTER — Other Ambulatory Visit: Payer: Self-pay | Admitting: Obstetrics and Gynecology

## 2020-08-29 DIAGNOSIS — R928 Other abnormal and inconclusive findings on diagnostic imaging of breast: Secondary | ICD-10-CM

## 2020-08-31 ENCOUNTER — Encounter: Payer: 59 | Admitting: Internal Medicine

## 2020-09-12 ENCOUNTER — Telehealth: Payer: Self-pay | Admitting: Internal Medicine

## 2020-09-12 NOTE — Telephone Encounter (Signed)
[name]

## 2020-09-12 NOTE — Telephone Encounter (Signed)
Gordy Levan from [employer] calling on behalf of patient  Would like all claims after 06.01.22 to be submitted over to them  Callback # [phone]

## 2020-09-13 NOTE — Telephone Encounter (Signed)
Ok to forward to [employer] please

## 2020-09-14 ENCOUNTER — Ambulatory Visit
Admission: RE | Admit: 2020-09-14 | Discharge: 2020-09-14 | Disposition: A | Discharge status: Home / Self Care | Payer: BC Managed Care – PPO | Source: Ambulatory Visit | Attending: Obstetrics and Gynecology | Admitting: Obstetrics and Gynecology

## 2020-09-14 ENCOUNTER — Ambulatory Visit
Admission: RE | Admit: 2020-09-14 | Discharge: 2020-09-14 | Disposition: A | Discharge status: Home / Self Care | Payer: 59 | Source: Ambulatory Visit | Attending: Obstetrics and Gynecology | Admitting: Obstetrics and Gynecology

## 2020-09-14 ENCOUNTER — Other Ambulatory Visit: Payer: Self-pay

## 2020-09-14 ENCOUNTER — Ambulatory Visit: Payer: 59

## 2020-09-14 DIAGNOSIS — R928 Other abnormal and inconclusive findings on diagnostic imaging of breast: Secondary | ICD-10-CM

## 2020-09-22 ENCOUNTER — Telehealth: Payer: Self-pay | Admitting: Internal Medicine

## 2020-09-22 NOTE — Telephone Encounter (Signed)
Prolia clarification

## 2020-10-03 ENCOUNTER — Other Ambulatory Visit: Payer: Self-pay | Admitting: Internal Medicine

## 2020-10-07 DIAGNOSIS — R52 Pain, unspecified: Secondary | ICD-10-CM | POA: Insufficient documentation

## 2020-10-25 ENCOUNTER — Encounter: Payer: Self-pay | Admitting: Family

## 2020-11-01 ENCOUNTER — Telehealth: Payer: Self-pay

## 2020-11-01 NOTE — Telephone Encounter (Signed)
Prolia VOB initiated via [email or web address]  Last OV:  Next OV:  Last Prolia inj: 05/05/31 Next Prolia inj DUE: 11/05/20

## 2020-11-03 NOTE — Telephone Encounter (Signed)
Re-running VOB for Prolia.   Insurances updated to include [place] as Primary and [employer]HC as Secondary with spouse as the subscriber.

## 2020-11-04 ENCOUNTER — Other Ambulatory Visit: Payer: Self-pay | Admitting: Internal Medicine

## 2020-11-04 MED ORDER — RELPAX 40 MG PO TABS: 40.0000 mg | ORAL_TABLET | ORAL | 5 refills | Status: DC | PRN

## 2020-11-12 ENCOUNTER — Ambulatory Visit (INDEPENDENT_AMBULATORY_CARE_PROVIDER_SITE_OTHER): Payer: BC Managed Care – PPO

## 2020-11-12 DIAGNOSIS — Z23 Encounter for immunization: Secondary | ICD-10-CM | POA: Diagnosis not present

## 2020-11-24 ENCOUNTER — Telehealth: Payer: Self-pay | Admitting: Internal Medicine

## 2020-11-24 NOTE — Telephone Encounter (Signed)
Patient calling to check status of Prolia injection  Patient states it is urgent that she receives her injection and is requesting a call back  Please call patient

## 2020-11-25 NOTE — Telephone Encounter (Addendum)
PA initiated via [email or web address]

## 2020-11-25 NOTE — Telephone Encounter (Signed)
Prior Auth required for [employer] PA PROCESS DETAILS: PA is required. Providers may call Medical Utilization at [phone] to initiate. Forms may be accessed online at [email or web address]  UHC PA PROCESS DETAILS: Effective 08/05/2020, please ensure your patient meets medical necessity by reviewing Medical Policy [id number] at [email or web address], and obtaining a pre-determination by contacting the PA dept at [phone]. A letter of medical necessity along with clinical information may be faxed to [phone].

## 2020-11-30 ENCOUNTER — Telehealth: Payer: Self-pay | Admitting: Internal Medicine

## 2020-11-30 ENCOUNTER — Other Ambulatory Visit: Payer: Self-pay | Admitting: Internal Medicine

## 2020-11-30 MED ORDER — ELETRIPTAN HYDROBROMIDE 40 MG PO TABS: 40.0000 mg | ORAL_TABLET | ORAL | 5 refills | Status: DC | PRN

## 2020-11-30 NOTE — Telephone Encounter (Signed)
Pharmacy has called and is requesting generic brand of RELPAX 40 MG tablet be sent over for pt.    Callback #- [phone]

## 2020-12-08 NOTE — Telephone Encounter (Addendum)
Pt ready for scheduling on or after 11/05/20 Rx sent to [facility]  Out-of-pocket cost due at time of visit: $0.00  Primary: [employer] [place] Prolia co-insurance: 0%  Admin fee co-insurance: 0%  Deductible: $5000 of $500 met  Prior Auth: APPROVED PA# [email or web address] KEY: [id number] Valid: 11/25/20-01/24/22  Secondary: UHC Medicare Prolia co-insurance: 0% Admin fee co-insurance: 0%  Deductible: does not apply  Prior Auth: APPROVED PA# [id number] Valid: 04/04/20-04/04/21    ** This summary of benefits is an estimation of the patient's out-of-pocket cost. Exact cost may very based on individual plan coverage.

## 2020-12-09 ENCOUNTER — Other Ambulatory Visit: Payer: Self-pay | Admitting: Internal Medicine

## 2020-12-09 NOTE — Telephone Encounter (Signed)
Please refill as per office routine med refill policy (all routine meds to be refilled for 3 mo or monthly (per pt preference) up to one year from last visit, then month to month grace period for 3 mo, then further med refills will have to be denied) ? ?

## 2021-01-07 MED ORDER — DENOSUMAB 60 MG/ML ~~LOC~~ SOSY: 60.0000 mg | PREFILLED_SYRINGE | Freq: Once | SUBCUTANEOUS | 0 refills | Status: AC

## 2021-01-07 NOTE — Addendum Note (Signed)
Addended by: Jasper Loser on: 01/07/2021 01:37 PM   Modules accepted: Orders

## 2021-01-10 ENCOUNTER — Encounter: Payer: Self-pay | Admitting: Internal Medicine

## 2021-01-10 ENCOUNTER — Other Ambulatory Visit: Payer: Self-pay

## 2021-01-10 ENCOUNTER — Ambulatory Visit (INDEPENDENT_AMBULATORY_CARE_PROVIDER_SITE_OTHER): Payer: BC Managed Care – PPO | Admitting: Internal Medicine

## 2021-01-10 VITALS — BP 120/70 | HR 68 | Temp 98.8°F | Resp 16 | Ht 67.0 in | Wt 138.0 lb

## 2021-01-10 DIAGNOSIS — Z0001 Encounter for general adult medical examination with abnormal findings: Secondary | ICD-10-CM

## 2021-01-10 DIAGNOSIS — B3731 Acute candidiasis of vulva and vagina: Secondary | ICD-10-CM

## 2021-01-10 DIAGNOSIS — E559 Vitamin D deficiency, unspecified: Secondary | ICD-10-CM

## 2021-01-10 DIAGNOSIS — D649 Anemia, unspecified: Secondary | ICD-10-CM

## 2021-01-10 DIAGNOSIS — E538 Deficiency of other specified B group vitamins: Secondary | ICD-10-CM

## 2021-01-10 DIAGNOSIS — R739 Hyperglycemia, unspecified: Secondary | ICD-10-CM | POA: Diagnosis not present

## 2021-01-10 DIAGNOSIS — E7800 Pure hypercholesterolemia, unspecified: Secondary | ICD-10-CM

## 2021-01-10 DIAGNOSIS — M810 Age-related osteoporosis without current pathological fracture: Secondary | ICD-10-CM

## 2021-01-10 DIAGNOSIS — R519 Headache, unspecified: Secondary | ICD-10-CM | POA: Diagnosis not present

## 2021-01-10 DIAGNOSIS — E7849 Other hyperlipidemia: Secondary | ICD-10-CM

## 2021-01-10 MED ORDER — FLUCONAZOLE 150 MG PO TABS: ORAL_TABLET | ORAL | 1 refills | Status: DC

## 2021-01-10 NOTE — Patient Instructions (Signed)
I sent a message to [name] in our office to restart the Prolia  Please take all new medication as prescribed   - the diflucan for yeast  Ok to take the gabapentin for the pain near the right ear  We have discussed the Cardiac CT Score test to measure the calcification level (if any) in your heart arteries.  This test has been ordered in our [place], so please call [place] [facility] directly, as they prefer this, at [phone] to be scheduled.  Please continue all other medications as before, and refills have been done if requested.  Please have the pharmacy call with any other refills you may need.  Please continue your efforts at being more active, low cholesterol diet, and weight control.  You are otherwise up to date with prevention measures today.  Please keep your appointments with your specialists as you may have planned  Please go to the LAB at the blood drawing area for the tests to be done  You will be contacted by phone if any changes need to be made immediately.  Otherwise, you will receive a letter about your results with an explanation, but please check with MyChart first.  Please remember to sign up for MyChart if you have not done so, as this will be important to you in the future with finding out test results, communicating by private email, and scheduling acute appointments online when needed.  Please make an Appointment to return in 6 months, or sooner if needed

## 2021-01-10 NOTE — Progress Notes (Signed)
Patient ID: [name], female   DOB: [birth year], 60 y.o.   MRN: [id number]         Chief Complaint:: wellness exam and Office Visit (Pain behind ear) , yeast vaginitis symptoms, hld, anemia, osteoporosis       HPI:  [name] is a 60 y.o. female here for wellness exam; declines covid booster, o/w up to date                        Also.has new onset 1 wk yeast vaginal d/c symptoms with itch.  Trying to follow lower chol diet,  Does not want statin for now.  No recent overt bleeding.  Now about 4 mo behind on prolia shot - needs to get restarted with our new office contact to arrange.  Also c/o burning mild constant pain to area of the scalp just post to the right ear, without scalp swelling or skin change for at least 1 wk.  No trauma, fever.  Pt denies chest pain, increased sob or doe, wheezing, orthopnea, PND, increased LE swelling, palpitations, dizziness or syncope.   Pt denies polydipsia, polyuria, or new focal neuro s/s.   Pt denies fever, wt loss, night sweats, loss of appetite, or other constitutional symptoms  No other hew complaints   Wt Readings from Last 3 Encounters:  01/10/21 138 lb (62.6 kg)  08/17/20 136 lb (61.7 kg)  01/11/20 128 lb (58.1 kg)   BP Readings from Last 3 Encounters:  01/10/21 120/70  08/17/20 122/82  08/12/20 117/75   Immunization History  Administered Date(s) Administered   Influenza,inj,Quad PF,6+ Mos 10/07/2014, 10/18/2016, 10/21/2017, 11/07/2018, 01/11/2020, 11/12/2020   Influenza,inj,quad, With Preservative 11/06/2013, 11/21/2017   Influenza-Unspecified 10/18/2015, 10/18/2016   PFIZER(Purple Top)SARS-COV-2 Vaccination 05/04/2019, 05/27/2019   Td 08/05/2001   Tdap 05/30/2012   Zoster Recombinat (Shingrix) 07/28/2019, 10/05/2019   Zoster, Live 08/30/2019   There are no preventive care reminders to display for this patient.     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 She has never used smokeless tobacco. She reports that she does not drink alcohol and does not use drugs. family history includes Alcohol abuse in her father and mother; Heart attack in her father. Allergies  Allergen Reactions   Sulfa Antibiotics Other (See Comments)   Sulfonamide Derivatives Other (See Comments)  Pt unknown reaction    Current Outpatient Medications on File Prior to Visit  Medication Sig Dispense Refill   acetaminophen (TYLENOL) 500 MG tablet Take 1,000 mg by mouth every 6 (six) hours as needed for mild pain, moderate pain or headache.     docusate sodium (COLACE) 100 MG capsule Take 200 mg by mouth daily.     eletriptan (RELPAX) 40 MG tablet Take 1 tablet (40 mg total) by mouth every 2 (two) hours as needed for migraine or headache. May repeat in 2 hours if headache persists or recurs. 6 tablet 5   gabapentin (NEURONTIN) 100 MG capsule Take 2 capsules (200 mg total) by mouth at bedtime. 60 capsule 3   levothyroxine (SYNTHROID) 75 MCG tablet TAKE 1 TABLET BY MOUTH EVERY DAY  90 tablet 1   No current facility-administered medications on file prior to visit.        ROS:  All others reviewed and negative.  Objective        PE:  BP 120/70 (BP Location: Right Arm, Patient Position: Sitting, Cuff Size: Normal)   Pulse 68   Temp 98.8 F (37.1 C) (Oral)   Resp 16   Ht 5\' 7"  (1.702 m)   Wt 138 lb (62.6 kg)   BMI 21.61 kg/m                 Constitutional: Pt appears in NAD               HENT: Head: NCAT.                Right Ear: External ear normal.                 Left Ear: External ear normal.                Eyes: . Pupils are equal, round, and reactive to light. Conjunctivae and EOM are normal               Nose: without d/c or deformity               Neck: Neck supple. Gross normal ROM               Cardiovascular: Normal rate and regular rhythm.                 Pulmonary/Chest: Effort normal and breath sounds without rales or wheezing.                Abd:  Soft, NT, ND, + BS, no organomegaly               Neurological: Pt is alert. At baseline orientation, motor grossly intact               Skin: Skin is warm. No rashes, no other new lesions, LE edema - none; no scalp change or swelling               Psychiatric: Pt behavior is normal without agitation   Micro: none  Cardiac tracings I have personally interpreted today:  none  Pertinent Radiological findings (summarize): none   Lab Results  Component Value Date   WBC 4.6 01/12/2021   HGB 11.7 (L) 01/12/2021   HCT 34.7 (L) 01/12/2021   PLT 189.0 01/12/2021   GLUCOSE 84 01/12/2021   CHOL 205 (H) 01/12/2021   TRIG 57.0 01/12/2021   HDL 94.20 01/12/2021   LDLDIRECT 95.5 07/20/2010   LDLCALC 100 (H) 01/12/2021  ALT 13 01/12/2021   AST 20 01/12/2021   NA 140 01/12/2021   K 4.6 01/12/2021   CL 104 01/12/2021   CREATININE 0.77 01/12/2021   BUN 27 (H) 01/12/2021   CO2 29 01/12/2021   TSH 3.58 01/12/2021   HGBA1C 5.7 01/12/2021   Assessment/Plan:  [name] is a 60 y.o. White or  Caucasian [1] female with  has a past medical history of Allergic rhinitis, Anemia, Anxiety and depression, Cervical disc disease (07/20/2010), Cyst of ovary, Foot fracture, left, Hyperlipidemia, Hypothyroidism, IBS (irritable bowel syndrome), Iron deficiency (07/20/2010), Low back pain, Lumbar disc disease (07/20/2010), Osteoporosis (07/20/2010), Post-operative nausea and vomiting, Renal stone (01/11/2020), Symptomatic PVCs (07/20/2010), and Vitamin D deficiency (07/20/2010).  Encounter for well adult exam with abnormal findings Age and sex appropriate education and counseling updated with regular exercise and diet Referrals for preventative services - none needed Immunizations addressed - declines covid booster Smoking counseling  - none needed Evidence for depression or other mood disorder - chornic anxiety no change Most recent labs reviewed. I have personally reviewed and have noted: 1) the patient's medical and social history 2) The patient's current medications and supplements 3) The patient's height, weight, and BMI have been recorded in the chart   HLD (hyperlipidemia) Lab Results  Component Value Date   LDLCALC 100 (H) 01/12/2021   Uncontrolled, goal ldl at least < 100, pt to cont low chol diet, declines statin, and also for Cardiac CT score   Vitamin D deficiency Last vitamin D Lab Results  Component Value Date   VD25OH 33.84 01/12/2021   Low, to start oral replacement   Osteoporosis Will need to arrage restart prolia asd,  to f/u any worsening symptoms or concerns  Normocytic anemia Also for f/u lab today, has no overt bleeding, etiology unclear  Headache With localized area right post parietal area but no scalp changes noted, suspect mild neuritic pain for unclear reason, no neuro change noted or right ear change,  to f/u any worsening symptoms or concerns  Yeast vaginitis Uncontrolled mild symptoms, for diflucan asd,  to f/u any worsening symptoms or  concerns  Followup: No follow-ups on file.  Cathlean Cower, MD 01/15/2021 4:54 PM [facility]

## 2021-01-12 ENCOUNTER — Encounter: Payer: Self-pay | Admitting: Internal Medicine

## 2021-01-12 ENCOUNTER — Other Ambulatory Visit (INDEPENDENT_AMBULATORY_CARE_PROVIDER_SITE_OTHER): Payer: BC Managed Care – PPO

## 2021-01-12 DIAGNOSIS — E7800 Pure hypercholesterolemia, unspecified: Secondary | ICD-10-CM

## 2021-01-12 DIAGNOSIS — D649 Anemia, unspecified: Secondary | ICD-10-CM | POA: Diagnosis not present

## 2021-01-12 DIAGNOSIS — E559 Vitamin D deficiency, unspecified: Secondary | ICD-10-CM

## 2021-01-12 DIAGNOSIS — E538 Deficiency of other specified B group vitamins: Secondary | ICD-10-CM | POA: Diagnosis not present

## 2021-01-12 DIAGNOSIS — R739 Hyperglycemia, unspecified: Secondary | ICD-10-CM

## 2021-01-12 LAB — BASIC METABOLIC PANEL
BUN: 27 mg/dL — ABNORMAL HIGH (ref 6–23)
CO2: 29 mEq/L (ref 19–32)
Calcium: 9.8 mg/dL (ref 8.4–10.5)
Chloride: 104 mEq/L (ref 96–112)
Creatinine, Ser: 0.77 mg/dL (ref 0.40–1.20)
GFR: 83.37 mL/min (ref >60.00)
Glucose, Bld: 84 mg/dL (ref 70–99)
Potassium: 4.6 mEq/L (ref 3.5–5.1)
Sodium: 140 mEq/L (ref 135–145)

## 2021-01-12 LAB — CBC WITH DIFFERENTIAL/PLATELET
Basophils Absolute: 0.1 10*3/uL (ref 0.0–0.1)
Basophils Relative: 1.2 % (ref 0.0–3.0)
Eosinophils Absolute: 0.3 10*3/uL (ref 0.0–0.7)
Eosinophils Relative: 5.6 % — ABNORMAL HIGH (ref 0.0–5.0)
HCT: 34.7 % — ABNORMAL LOW (ref 36.0–46.0)
Hemoglobin: 11.7 g/dL — ABNORMAL LOW (ref 12.0–15.0)
Lymphocytes Relative: 40.1 % (ref 12.0–46.0)
Lymphs Abs: 1.9 10*3/uL (ref 0.7–4.0)
MCHC: 33.8 g/dL (ref 30.0–36.0)
MCV: 92 fl (ref 78.0–100.0)
Monocytes Absolute: 0.6 10*3/uL (ref 0.1–1.0)
Monocytes Relative: 12.4 % — ABNORMAL HIGH (ref 3.0–12.0)
Neutro Abs: 1.9 10*3/uL (ref 1.4–7.7)
Neutrophils Relative %: 40.7 % — ABNORMAL LOW (ref 43.0–77.0)
Platelets: 189 10*3/uL (ref 150.0–400.0)
RBC: 3.77 Mil/uL — ABNORMAL LOW (ref 3.87–5.11)
RDW: 13 % (ref 11.5–15.5)
WBC: 4.6 10*3/uL (ref 4.0–10.5)

## 2021-01-12 LAB — HEPATIC FUNCTION PANEL
ALT: 13 U/L (ref 0–35)
AST: 20 U/L (ref 0–37)
Albumin: 4.1 g/dL (ref 3.5–5.2)
Alkaline Phosphatase: 63 U/L (ref 39–117)
Bilirubin, Direct: 0.1 mg/dL (ref 0.0–0.3)
Total Bilirubin: 0.5 mg/dL (ref 0.2–1.2)
Total Protein: 6.8 g/dL (ref 6.0–8.3)

## 2021-01-12 LAB — TSH: TSH: 3.58 u[IU]/mL (ref 0.35–5.50)

## 2021-01-12 LAB — URINALYSIS, ROUTINE W REFLEX MICROSCOPIC
Bilirubin Urine: NEGATIVE
Hgb urine dipstick: NEGATIVE
Ketones, ur: NEGATIVE
Leukocytes,Ua: NEGATIVE
Nitrite: NEGATIVE
RBC / HPF: NONE SEEN (ref 0–?)
Specific Gravity, Urine: <=1.005 — AB (ref 1.000–1.030)
Total Protein, Urine: NEGATIVE
Urine Glucose: NEGATIVE
Urobilinogen, UA: 0.2 (ref 0.0–1.0)
WBC, UA: NONE SEEN (ref 0–?)
pH: 6.5 (ref 5.0–8.0)

## 2021-01-12 LAB — VITAMIN D 25 HYDROXY (VIT D DEFICIENCY, FRACTURES): VITD: 33.84 ng/mL (ref 30.00–100.00)

## 2021-01-12 LAB — HEMOGLOBIN A1C: Hgb A1c MFr Bld: 5.7 % (ref 4.6–6.5)

## 2021-01-12 LAB — LIPID PANEL
Cholesterol: 205 mg/dL — ABNORMAL HIGH (ref 0–200)
HDL: 94.2 mg/dL (ref >39.00)
LDL Cholesterol: 100 mg/dL — ABNORMAL HIGH (ref 0–99)
NonHDL: 111.1
Total CHOL/HDL Ratio: 2
Triglycerides: 57 mg/dL (ref 0.0–149.0)
VLDL: 11.4 mg/dL (ref 0.0–40.0)

## 2021-01-12 LAB — IBC PANEL
Iron: 93 ug/dL (ref 42–145)
Saturation Ratios: 26.1 % (ref 20.0–50.0)
TIBC: 357 ug/dL (ref 250.0–450.0)
Transferrin: 255 mg/dL (ref 212.0–360.0)

## 2021-01-12 LAB — VITAMIN B12: Vitamin B-12: 463 pg/mL (ref 211–911)

## 2021-01-12 LAB — FERRITIN: Ferritin: 21.7 ng/mL (ref 10.0–291.0)

## 2021-01-15 DIAGNOSIS — B3731 Acute candidiasis of vulva and vagina: Secondary | ICD-10-CM | POA: Insufficient documentation

## 2021-01-15 NOTE — Assessment & Plan Note (Signed)
With localized area right post parietal area but no scalp changes noted, suspect mild neuritic pain for unclear reason, no neuro change noted or right ear change,  to f/u any worsening symptoms or concerns

## 2021-01-15 NOTE — Assessment & Plan Note (Signed)
Age and sex appropriate education and counseling updated with regular exercise and diet Referrals for preventative services - none needed Immunizations addressed - declines covid booster Smoking counseling  - none needed Evidence for depression or other mood disorder - chornic anxiety no change Most recent labs reviewed. I have personally reviewed and have noted: 1) the patient's medical and social history 2) The patient's current medications and supplements 3) The patient's height, weight, and BMI have been recorded in the chart

## 2021-01-15 NOTE — Assessment & Plan Note (Signed)
Will need to arrage restart prolia asd,  to f/u any worsening symptoms or concerns

## 2021-01-15 NOTE — Assessment & Plan Note (Signed)
Last vitamin D Lab Results  Component Value Date   VD25OH 33.84 01/12/2021   Low, to start oral replacement

## 2021-01-15 NOTE — Assessment & Plan Note (Signed)
Lab Results  Component Value Date   LDLCALC 100 (H) 01/12/2021   Uncontrolled, goal ldl at least < 100, pt to cont low chol diet, declines statin, and also for Cardiac CT score

## 2021-01-15 NOTE — Assessment & Plan Note (Signed)
Also for f/u lab today, has no overt bleeding, etiology unclear

## 2021-01-15 NOTE — Assessment & Plan Note (Signed)
Uncontrolled mild symptoms, for diflucan asd,  to f/u any worsening symptoms or concerns

## 2021-01-17 ENCOUNTER — Telehealth: Payer: Self-pay | Admitting: Internal Medicine

## 2021-01-17 NOTE — Telephone Encounter (Signed)
Pt has appt on 12.15.2022, and states that a prior authorization is needed for Prolia injection. Pt is aware that insurance is covering 100%. Needs for someone to call insurance to give authorization.    Callback #- [phone]

## 2021-01-19 ENCOUNTER — Other Ambulatory Visit: Payer: Self-pay

## 2021-01-19 ENCOUNTER — Ambulatory Visit (INDEPENDENT_AMBULATORY_CARE_PROVIDER_SITE_OTHER): Payer: BC Managed Care – PPO

## 2021-01-19 DIAGNOSIS — M810 Age-related osteoporosis without current pathological fracture: Secondary | ICD-10-CM

## 2021-01-19 MED ORDER — DENOSUMAB 60 MG/ML ~~LOC~~ SOSY
60.0000 mg | PREFILLED_SYRINGE | Freq: Once | SUBCUTANEOUS | Status: AC
  Administered 2021-01-19: 60 mg via SUBCUTANEOUS

## 2021-01-19 NOTE — Progress Notes (Signed)
Prolia given.   Please co-sign.  

## 2021-01-26 ENCOUNTER — Telehealth (INDEPENDENT_AMBULATORY_CARE_PROVIDER_SITE_OTHER): Payer: BC Managed Care – PPO | Admitting: Family Medicine

## 2021-01-26 ENCOUNTER — Encounter: Payer: Self-pay | Admitting: Family Medicine

## 2021-01-26 VITALS — Temp 101.0°F

## 2021-01-26 DIAGNOSIS — R6889 Other general symptoms and signs: Secondary | ICD-10-CM | POA: Diagnosis not present

## 2021-01-26 MED ORDER — OSELTAMIVIR PHOSPHATE 75 MG PO CAPS: 75.0000 mg | ORAL_CAPSULE | Freq: Two times a day (BID) | ORAL | 0 refills | Status: AC

## 2021-01-26 NOTE — Progress Notes (Signed)
Virtual Visit via Telephone Note  I connected with [name] on 01/26/21 at  4:30 PM EST by telephone and verified that I am speaking with the correct person using two identifiers.   I discussed the limitations, risks, security and privacy concerns of performing an evaluation and management service by telephone and the availability of in person appointments. I also discussed with the patient that there may be a patient responsible charge related to this service. The patient expressed understanding and agreed to proceed.  Location patient: home Location provider: work or home office Participants present for the call: patient, provider Patient did not have a visit in the prior 7 days to address this/these issue(s).   History of Present Illness: Pt is a 60 year old female with  pmh sig for migraines, allergies, GERD, IBS, osteoporosis, history of anemia, HLD, back pain who is followed by Dr. Jenny Reichmann and seen for acute concern.  Scratchy throat Tuesday On Wed pt developed nasal congestion, achy, wet/dry cough, HA like a migraine, nausea, decreased appetite, fever, chills Denies vomiting, sick contacts.  Did go to a candle light service at church. Taking bromphine, relpax, tylenol Pt had 2 negative COVID test.     Patient states she has had COVID before, but this feels different.   Observations/Objective: Patient sounds cheerful and well on the phone. I do not appreciate any SOB. Speech and thought processing are grossly intact. Patient reported vitals: 101.14F  Assessment and Plan: Flu-like symptoms  -Discussed possible causes including acute nasopharyngitis, COVID, influenza. -Negative COVID test x2 -Likely influenza virus versus other viral URI -Continue supportive care including rest, hydration, gargling with warm salt water or Chloraseptic spray, OTC cough/cold medications, steam from shower -Continue Tylenol.  Okay to alternate with ibuprofen as needed -Given strict  precautions -Discussed r/b/a of influenza antiviral medication.  Patient wishes to start.  Advised would have been beneficial to have flu testing. - Plan: oseltamivir (TAMIFLU) 75 MG capsule   Follow Up Instructions:  F/u prn with PCP  [id number] [id number] [id number] 11-20 [id number] 21-30 I did not refer this patient for an OV in the next 24 hours for this/these issue(s).  I discussed the assessment and treatment plan with the patient. The patient was provided an opportunity to ask questions and all were answered. The patient agreed with the plan and demonstrated an understanding of the instructions.   The patient was advised to call back or seek an in-person evaluation if the symptoms worsen or if the condition fails to improve as anticipated.  I provided 11:27  minutes of non-face-to-face time during this encounter.   Billie Ruddy, MD

## 2021-01-31 ENCOUNTER — Telehealth: Payer: Self-pay

## 2021-01-31 ENCOUNTER — Encounter: Payer: Self-pay | Admitting: Internal Medicine

## 2021-01-31 NOTE — Telephone Encounter (Signed)
PA has been sent in for Prolia as the PA was never submitted.  [facility] stated it will take up to 24 to get a response of approval/denial.  PA confirmation number: [id number].

## 2021-02-18 NOTE — Telephone Encounter (Signed)
2023 VOB initiated for Prolia °

## 2021-03-04 NOTE — Telephone Encounter (Signed)
Last Prolia inj 01/19/21 ?Next Prolia inj due 07/21/21 ?

## 2021-06-07 ENCOUNTER — Other Ambulatory Visit: Payer: Self-pay | Admitting: Internal Medicine

## 2021-06-07 NOTE — Telephone Encounter (Signed)
Please refill as per office routine med refill policy (all routine meds to be refilled for 3 mo or monthly (per pt preference) up to one year from last visit, then month to month grace period for 3 mo, then further med refills will have to be denied) ? ?

## 2021-07-08 NOTE — Telephone Encounter (Signed)
Prolia VOB initiated via [email or web address]  Last OV:  Next OV:  Last Prolia inj 01/19/21 Next Prolia inj due 07/21/21

## 2021-07-19 ENCOUNTER — Other Ambulatory Visit: Payer: Self-pay | Admitting: Family Medicine

## 2021-08-05 NOTE — Telephone Encounter (Signed)
Pt ready for scheduling on or after 07/21/21  Out-of-pocket cost due at time of visit: $0  Primary: [employer] [place] Prolia co-insurance: 0% Admin fee co-insurance: 0%  Secondary: n/a Prolia co-insurance:  Admin fee co-insurance:   Deductible: does not apply  Prior Auth: APPROVED PA# [id number] Valid: 11/03/20-11/02/21    ** This summary of benefits is an estimation of the patient's out-of-pocket cost. Exact cost may very based on individual plan coverage.

## 2021-08-05 NOTE — Telephone Encounter (Signed)
PA PROCESS DETAILS: Prior authorization is on file (Authorization # [id number]) and is valid from 11/03/2020 through 11/02/2021. Number of treatments covered 2. The prior authorization department can be contacted at [phone].

## 2021-08-07 NOTE — Telephone Encounter (Signed)
Left voicemail for patient to return call and schedule injection.

## 2021-08-10 ENCOUNTER — Telehealth: Payer: Self-pay

## 2021-08-10 ENCOUNTER — Ambulatory Visit (INDEPENDENT_AMBULATORY_CARE_PROVIDER_SITE_OTHER): Payer: BC Managed Care – PPO

## 2021-08-10 DIAGNOSIS — M810 Age-related osteoporosis without current pathological fracture: Secondary | ICD-10-CM | POA: Diagnosis not present

## 2021-08-10 MED ORDER — DENOSUMAB 60 MG/ML ~~LOC~~ SOSY
60.0000 mg | PREFILLED_SYRINGE | Freq: Once | SUBCUTANEOUS | Status: AC
  Administered 2021-08-10: 60 mg via SUBCUTANEOUS

## 2021-08-10 NOTE — Progress Notes (Signed)
Patient seen in office today and Prolia given in right arm.

## 2021-08-10 NOTE — Telephone Encounter (Signed)
Last Prolia inj 08/10/21 Next Prolia inj due 02/11/22

## 2021-08-25 ENCOUNTER — Encounter: Payer: Self-pay | Admitting: Internal Medicine

## 2021-08-25 DIAGNOSIS — E7849 Other hyperlipidemia: Secondary | ICD-10-CM

## 2021-08-25 DIAGNOSIS — R9431 Abnormal electrocardiogram [ECG] [EKG]: Secondary | ICD-10-CM

## 2021-08-25 DIAGNOSIS — R739 Hyperglycemia, unspecified: Secondary | ICD-10-CM

## 2021-08-25 DIAGNOSIS — E7800 Pure hypercholesterolemia, unspecified: Secondary | ICD-10-CM

## 2021-08-25 NOTE — Telephone Encounter (Signed)
You will have to put an order in.. since [facility] has closed put the location as [facility] ([facility]) and the referrals coordinators will send over to them

## 2021-08-29 LAB — HM DEXA SCAN

## 2021-08-29 NOTE — Telephone Encounter (Signed)
Attempted to get patient scheduled a few times but have no had any luck.  Patient previously sent message to Dr. Jenny Reichmann.  New order placed and [facility] will contact patient

## 2021-09-05 NOTE — Telephone Encounter (Deleted)
Prolia VOB initiated via [email or web address]  Last Prolia inj 08/10/21 Next Prolia inj due 02/11/22  Prior Auth: APPROVED PA# [id number] Valid: 11/03/20-11/02/21

## 2021-09-18 ENCOUNTER — Ambulatory Visit (HOSPITAL_BASED_OUTPATIENT_CLINIC_OR_DEPARTMENT_OTHER)
Admission: RE | Admit: 2021-09-18 | Discharge: 2021-09-18 | Disposition: A | Discharge status: Home / Self Care | Payer: BC Managed Care – PPO | Source: Ambulatory Visit | Attending: Internal Medicine | Admitting: Internal Medicine

## 2021-09-18 DIAGNOSIS — R739 Hyperglycemia, unspecified: Secondary | ICD-10-CM | POA: Insufficient documentation

## 2021-09-18 DIAGNOSIS — R9431 Abnormal electrocardiogram [ECG] [EKG]: Secondary | ICD-10-CM | POA: Insufficient documentation

## 2021-09-18 DIAGNOSIS — E7849 Other hyperlipidemia: Secondary | ICD-10-CM | POA: Insufficient documentation

## 2021-12-18 ENCOUNTER — Other Ambulatory Visit: Payer: Self-pay | Admitting: Internal Medicine

## 2022-01-04 ENCOUNTER — Ambulatory Visit: Payer: BC Managed Care – PPO | Admitting: Internal Medicine

## 2022-01-04 VITALS — BP 108/62 | HR 61 | Temp 98.3°F | Ht 67.0 in | Wt 142.0 lb

## 2022-01-04 DIAGNOSIS — E559 Vitamin D deficiency, unspecified: Secondary | ICD-10-CM

## 2022-01-04 DIAGNOSIS — R635 Abnormal weight gain: Secondary | ICD-10-CM

## 2022-01-04 DIAGNOSIS — M8580 Other specified disorders of bone density and structure, unspecified site: Secondary | ICD-10-CM | POA: Insufficient documentation

## 2022-01-04 DIAGNOSIS — R300 Dysuria: Secondary | ICD-10-CM

## 2022-01-04 DIAGNOSIS — R739 Hyperglycemia, unspecified: Secondary | ICD-10-CM

## 2022-01-04 DIAGNOSIS — Z0001 Encounter for general adult medical examination with abnormal findings: Secondary | ICD-10-CM | POA: Diagnosis not present

## 2022-01-04 DIAGNOSIS — Z23 Encounter for immunization: Secondary | ICD-10-CM | POA: Diagnosis not present

## 2022-01-04 DIAGNOSIS — R0683 Snoring: Secondary | ICD-10-CM | POA: Diagnosis not present

## 2022-01-04 DIAGNOSIS — E7849 Other hyperlipidemia: Secondary | ICD-10-CM

## 2022-01-04 DIAGNOSIS — E538 Deficiency of other specified B group vitamins: Secondary | ICD-10-CM | POA: Diagnosis not present

## 2022-01-04 LAB — URINALYSIS, ROUTINE W REFLEX MICROSCOPIC
Bilirubin Urine: NEGATIVE
Hgb urine dipstick: NEGATIVE
Ketones, ur: NEGATIVE
Leukocytes,Ua: NEGATIVE
Nitrite: NEGATIVE
Specific Gravity, Urine: 1.01 (ref 1.000–1.030)
Total Protein, Urine: NEGATIVE
Urine Glucose: NEGATIVE
Urobilinogen, UA: 0.2 (ref 0.0–1.0)
pH: 7.5 (ref 5.0–8.0)

## 2022-01-04 MED ORDER — PHENTERMINE HCL 37.5 MG PO CAPS: 37.5000 mg | ORAL_CAPSULE | ORAL | 2 refills | Status: DC

## 2022-01-04 NOTE — Progress Notes (Signed)
Patient ID: [name], female   DOB: 26-Oct-1960, 61 y.o.   MRN: [id number]         Chief Complaint:: wellness exam and Snoring  , wt gain,, hld, dysuria       HPI:  [name] is a 61 y.o. female here for wellness exam; declines covid booster, for flu shot today, o/w up to date                        Also has seen Gyn with pap and mammogram, and DXA 7/25 - bone density low but improved per pt.  Gained several lbs with less better diet, plans to do better. Has ongoing snoring, denies daytime somnolence, has seen dental but needs a letter to say she needs a dental appliance for the snoring.  Does not want statin today.  Denies urinary symptoms such as frequency, urgency, flank pain, hematuria or n/v, fever, chills, but has had mild discomfort on urination in the last day  Has gained several lbs with less rigorous diet.  Wt Readings from Last 3 Encounters:  01/04/22 142 lb (64.4 kg)  01/10/21 138 lb (62.6 kg)  08/17/20 136 lb (61.7 kg)   BP Readings from Last 3 Encounters:  01/04/22 108/62  01/10/21 120/70  08/17/20 122/82   Immunization History  Administered Date(s) Administered   Influenza,inj,Quad PF,6+ Mos 10/07/2014, 10/18/2016, 10/21/2017, 11/07/2018, 01/11/2020, 11/12/2020, 01/04/2022   Influenza,inj,quad, With Preservative 11/06/2013, 11/21/2017   Influenza-Unspecified 10/18/2015, 10/18/2016, 11/21/2017   PFIZER(Purple Top)SARS-COV-2 Vaccination 05/04/2019, 05/27/2019   Td 08/05/2001   Tdap 05/30/2012   Zoster Recombinat (Shingrix) 07/28/2019, 10/05/2019   Zoster, Live 08/30/2019   There are no preventive care reminders to display for this patient.     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 She has never used smokeless tobacco. She reports that she does not drink alcohol and does not use drugs. family history includes Alcohol abuse in her father and mother; Heart attack in her father. Allergies  Allergen Reactions   Sulfa Antibiotics Other (See Comments)   Ciprofloxacin Other (See Comments)   Sulfonamide Derivatives Other (See Comments)    Pt unknown reaction    Current Outpatient Medications on File Prior to Visit  Medication Sig Dispense Refill   acetaminophen (TYLENOL) 500 MG tablet Take 1,000 mg by mouth  every 6 (six) hours as needed for mild pain, moderate pain or headache.     docusate sodium (COLACE) 100 MG capsule Take 200 mg by mouth daily.     eletriptan (RELPAX) 40 MG tablet Take 1 tablet (40 mg total) by mouth every 2 (two) hours as needed for migraine or headache. May repeat in 2 hours if headache persists or recurs. 6 tablet 5   levothyroxine (SYNTHROID) 75 MCG tablet TAKE 1 TABLET BY MOUTH EVERY DAY 90 tablet 1   No current facility-administered medications on file prior to visit.        ROS:  All others reviewed and negative.  Objective        PE:  BP 108/62 (BP Location: Left Arm, Patient Position: Sitting, Cuff Size: Large)   Pulse 61   Temp 98.3  F (36.8 C) (Oral)   Ht '5\' 7"'$  (1.702 m)   Wt 142 lb (64.4 kg)   SpO2 98%   BMI 22.24 kg/m                 Constitutional: Pt appears in NAD               HENT: Head: NCAT.                Right Ear: External ear normal.                 Left Ear: External ear normal.                Eyes: . Pupils are equal, round, and reactive to light. Conjunctivae and EOM are normal               Nose: without d/c or deformity               Neck: Neck supple. Gross normal ROM               Cardiovascular: Normal rate and regular rhythm.                 Pulmonary/Chest: Effort normal and breath sounds without rales or wheezing.                Abd:  Soft, NT, ND, + BS, no organomegaly               Neurological: Pt is alert. At baseline orientation, motor grossly intact               Skin: Skin is warm. No rashes, no other new lesions, LE edema - none               Psychiatric: Pt behavior is normal without agitation   Micro: none  Cardiac tracings I have personally interpreted today:  none  Pertinent Radiological findings (summarize): none   Lab Results  Component Value Date   WBC 4.6 01/12/2021   HGB 11.7 (L) 01/12/2021   HCT 34.7 (L) 01/12/2021   PLT 189.0 01/12/2021   GLUCOSE 84 01/12/2021   CHOL 205 (H) 01/12/2021   TRIG 57.0 01/12/2021   HDL 94.20 01/12/2021   LDLDIRECT 95.5 07/20/2010   LDLCALC 100 (H) 01/12/2021   ALT 13 01/12/2021   AST 20 01/12/2021   NA 140 01/12/2021   K 4.6 01/12/2021   CL 104 01/12/2021   CREATININE 0.77 01/12/2021   BUN 27 (H) 01/12/2021   CO2 29 01/12/2021   TSH 3.58 01/12/2021   HGBA1C 5.7 01/12/2021   Assessment/Plan:  Carol P  Calderon is a 61 y.o. White or Caucasian [1] female with  has a past medical history of Allergic rhinitis, Anemia, Anxiety and depression, Cervical disc disease (07/20/2010), Cyst of ovary, Foot fracture, left, Hyperlipidemia, Hypothyroidism, IBS (irritable bowel syndrome), Iron deficiency (07/20/2010), Low back pain, Lumbar  disc disease (07/20/2010), Osteoporosis (07/20/2010), Post-operative nausea and vomiting, Renal stone (01/11/2020), Symptomatic PVCs (07/20/2010), and Vitamin D deficiency (07/20/2010).  Encounter for well adult exam with abnormal findings Age and sex appropriate education and counseling updated with regular exercise and diet Referrals for preventative services - none needed Immunizations addressed - declines covid booster, for flu shot today Smoking counseling  - none needed Evidence for depression or other mood disorder - none significant Most recent labs reviewed. I have personally reviewed and have noted: 1) the patient's medical and social history 2) The patient's current medications and supplements 3) The patient's height, weight, and BMI have been recorded in the chart   Dysuria Exam benign, for ua and culture, with tx pending results  HLD (hyperlipidemia) Lab Results  Component Value Date   LDLCALC 100 (H) 01/12/2021   uncontrolled, pt to continue current low chol diet, declines statin   Snoring Pt given to recommend dental appliance for tx for this  Weight gain For short term phentermine to assist with less calorie diet  Followup: Return in about 1 year (around 01/05/2023).  Cathlean Cower, MD 01/06/2022 7:24 PM [facility]

## 2022-01-04 NOTE — Telephone Encounter (Signed)
Info complete

## 2022-01-04 NOTE — Patient Instructions (Signed)
You had the flu shot today  Please take all new medication as prescribed - the phentermine  Please continue all other medications as before, and refills have been done if requested.  Please have the pharmacy call with any other refills you may need.  Please continue your efforts at being more active, low cholesterol diet, and weight control.  You are otherwise up to date with prevention measures today.  Please keep your appointments with your specialists as you may have planned  You are given the letter for the mouthpiece dental appliance for snorning  Please go to the LAB at the blood drawing area for the tests to be done - just the urine testing today  You will be contacted by phone if any changes need to be made immediately.  Otherwise, you will receive a letter about your results with an explanation, but please check with MyChart first.  Please remember to sign up for MyChart if you have not done so, as this will be important to you in the future with finding out test results, communicating by private email, and scheduling acute appointments online when needed.  Please make an Appointment to return for your 1 year visit, or sooner if needed, with Lab testing by Appointment as well, to be done about 3-5 days before at the [facility] (so this is for TWO appointments - please see the scheduling desk as you leave)

## 2022-01-05 LAB — URINE CULTURE: Result:: NO GROWTH

## 2022-01-06 ENCOUNTER — Encounter: Payer: Self-pay | Admitting: Internal Medicine

## 2022-01-06 DIAGNOSIS — R300 Dysuria: Secondary | ICD-10-CM | POA: Insufficient documentation

## 2022-01-06 DIAGNOSIS — R0683 Snoring: Secondary | ICD-10-CM | POA: Insufficient documentation

## 2022-01-06 DIAGNOSIS — R635 Abnormal weight gain: Secondary | ICD-10-CM | POA: Insufficient documentation

## 2022-01-06 NOTE — Assessment & Plan Note (Signed)
Pt given to recommend dental appliance for tx for this

## 2022-01-06 NOTE — Assessment & Plan Note (Signed)
Age and sex appropriate education and counseling updated with regular exercise and diet Referrals for preventative services - none needed Immunizations addressed - declines covid booster, for flu shot today Smoking counseling  - none needed Evidence for depression or other mood disorder - none significant Most recent labs reviewed. I have personally reviewed and have noted: 1) the patient's medical and social history 2) The patient's current medications and supplements 3) The patient's height, weight, and BMI have been recorded in the chart

## 2022-01-06 NOTE — Assessment & Plan Note (Signed)
Lab Results  Component Value Date   LDLCALC 100 (H) 01/12/2021   uncontrolled, pt to continue current low chol diet, declines statin

## 2022-01-06 NOTE — Assessment & Plan Note (Signed)
For short term phentermine to assist with less calorie diet

## 2022-01-06 NOTE — Assessment & Plan Note (Signed)
Exam benign, for ua and culture, with tx pending results 

## 2022-01-09 DIAGNOSIS — M47814 Spondylosis without myelopathy or radiculopathy, thoracic region: Secondary | ICD-10-CM | POA: Insufficient documentation

## 2022-01-10 DIAGNOSIS — F419 Anxiety disorder, unspecified: Secondary | ICD-10-CM | POA: Insufficient documentation

## 2022-01-18 NOTE — Telephone Encounter (Signed)
Forwarding to RX Prior Auth Team 

## 2022-02-07 ENCOUNTER — Other Ambulatory Visit (HOSPITAL_COMMUNITY): Payer: Self-pay

## 2022-02-07 NOTE — Telephone Encounter (Signed)
Prolia VOB initiated via [email or web address] 

## 2022-02-08 ENCOUNTER — Other Ambulatory Visit (HOSPITAL_COMMUNITY): Payer: Self-pay

## 2022-02-08 NOTE — Telephone Encounter (Signed)
Pharmacy Patient Advocate Encounter   Received notification that prior authorization for Prolia '60MG'$ /ML syringes is required/requested.  Per Test Claim: PA required   PA submitted on 02/08/22 to (ins) [employer] via CoverMyMeds Key [id number]'3MG'$ YU Status is pending

## 2022-02-08 NOTE — Telephone Encounter (Signed)
Patient Advocate Encounter  Prior Authorization for Prolia '60MG'$ /ML syringes has been approved.    PA# [id number] Key: [id number]'3MG'$ YU  Effective dates: 02/08/22 through 02/09/23

## 2022-02-09 ENCOUNTER — Other Ambulatory Visit (HOSPITAL_COMMUNITY): Payer: Self-pay

## 2022-02-14 ENCOUNTER — Telehealth: Payer: Self-pay | Admitting: Internal Medicine

## 2022-02-14 ENCOUNTER — Ambulatory Visit (INDEPENDENT_AMBULATORY_CARE_PROVIDER_SITE_OTHER): Payer: BC Managed Care – PPO | Admitting: *Deleted

## 2022-02-14 DIAGNOSIS — M810 Age-related osteoporosis without current pathological fracture: Secondary | ICD-10-CM | POA: Diagnosis not present

## 2022-02-14 MED ORDER — DENOSUMAB 60 MG/ML ~~LOC~~ SOSY
60.0000 mg | PREFILLED_SYRINGE | Freq: Once | SUBCUTANEOUS | Status: AC
  Administered 2022-02-14: 60 mg via SUBCUTANEOUS

## 2022-02-14 NOTE — Telephone Encounter (Signed)
Patient is scheduled today for Prolia injection but no prior auth documented. Can someone check this? Patient is scheduled for 9:00am!

## 2022-02-14 NOTE — Telephone Encounter (Signed)
Prior Authorization for Prolia '60MG'$ /ML syringes has been approved.     PA# [id number] Key: [id number]'3MG'$ YU   Effective dates: 02/08/22 through 02/09/23

## 2022-02-14 NOTE — Progress Notes (Signed)
Pls cosign for Prolia inj../lmb  

## 2022-02-15 NOTE — Telephone Encounter (Signed)
Last Prolia inj: 02/14/22 Next Prolia inj DUE: 08/14/22

## 2022-02-22 ENCOUNTER — Other Ambulatory Visit: Payer: Self-pay | Admitting: Internal Medicine

## 2022-02-22 ENCOUNTER — Other Ambulatory Visit: Payer: Self-pay

## 2022-02-22 NOTE — Telephone Encounter (Signed)
Please refill as per office routine med refill policy (all routine meds to be refilled for 3 mo or monthly (per pt preference) up to one year from last visit, then month to month grace period for 3 mo, then further med refills will have to be denied)

## 2022-07-30 DIAGNOSIS — M79672 Pain in left foot: Secondary | ICD-10-CM | POA: Insufficient documentation

## 2022-08-16 ENCOUNTER — Ambulatory Visit (INDEPENDENT_AMBULATORY_CARE_PROVIDER_SITE_OTHER): Payer: BC Managed Care – PPO | Admitting: *Deleted

## 2022-08-16 DIAGNOSIS — M810 Age-related osteoporosis without current pathological fracture: Secondary | ICD-10-CM

## 2022-08-16 MED ORDER — DENOSUMAB 60 MG/ML ~~LOC~~ SOSY
60.0000 mg | PREFILLED_SYRINGE | Freq: Once | SUBCUTANEOUS | Status: AC
  Administered 2022-08-16: 60 mg via SUBCUTANEOUS

## 2022-08-16 NOTE — Progress Notes (Signed)
Pls cosign for Prolia inj../lmb  

## 2022-08-19 ENCOUNTER — Other Ambulatory Visit: Payer: Self-pay | Admitting: Internal Medicine

## 2022-09-04 DIAGNOSIS — M722 Plantar fascial fibromatosis: Secondary | ICD-10-CM | POA: Insufficient documentation

## 2022-09-25 ENCOUNTER — Other Ambulatory Visit (INDEPENDENT_AMBULATORY_CARE_PROVIDER_SITE_OTHER): Payer: BC Managed Care – PPO

## 2022-09-25 DIAGNOSIS — R739 Hyperglycemia, unspecified: Secondary | ICD-10-CM

## 2022-09-25 DIAGNOSIS — E559 Vitamin D deficiency, unspecified: Secondary | ICD-10-CM | POA: Diagnosis not present

## 2022-09-25 DIAGNOSIS — E538 Deficiency of other specified B group vitamins: Secondary | ICD-10-CM | POA: Diagnosis not present

## 2022-09-25 DIAGNOSIS — E7849 Other hyperlipidemia: Secondary | ICD-10-CM

## 2022-09-25 LAB — LIPID PANEL
Cholesterol: 258 mg/dL — ABNORMAL HIGH (ref 0–200)
HDL: 92.9 mg/dL (ref >39.00)
LDL Cholesterol: 154 mg/dL — ABNORMAL HIGH (ref 0–99)
NonHDL: 164.66
Total CHOL/HDL Ratio: 3
Triglycerides: 52 mg/dL (ref 0.0–149.0)
VLDL: 10.4 mg/dL (ref 0.0–40.0)

## 2022-09-25 LAB — CBC WITH DIFFERENTIAL/PLATELET
Basophils Absolute: 0 10*3/uL (ref 0.0–0.1)
Basophils Relative: 1 % (ref 0.0–3.0)
Eosinophils Absolute: 0.2 10*3/uL (ref 0.0–0.7)
Eosinophils Relative: 3.9 % (ref 0.0–5.0)
HCT: 37.6 % (ref 36.0–46.0)
Hemoglobin: 12.6 g/dL (ref 12.0–15.0)
Lymphocytes Relative: 43.8 % (ref 12.0–46.0)
Lymphs Abs: 2.1 10*3/uL (ref 0.7–4.0)
MCHC: 33.4 g/dL (ref 30.0–36.0)
MCV: 93.3 fl (ref 78.0–100.0)
Monocytes Absolute: 0.5 10*3/uL (ref 0.1–1.0)
Monocytes Relative: 10.4 % (ref 3.0–12.0)
Neutro Abs: 1.9 10*3/uL (ref 1.4–7.7)
Neutrophils Relative %: 40.9 % — ABNORMAL LOW (ref 43.0–77.0)
Platelets: 194 10*3/uL (ref 150.0–400.0)
RBC: 4.03 Mil/uL (ref 3.87–5.11)
RDW: 13.3 % (ref 11.5–15.5)
WBC: 4.7 10*3/uL (ref 4.0–10.5)

## 2022-09-25 LAB — HEPATIC FUNCTION PANEL
ALT: 11 U/L (ref 0–35)
AST: 17 U/L (ref 0–37)
Albumin: 4.3 g/dL (ref 3.5–5.2)
Alkaline Phosphatase: 36 U/L — ABNORMAL LOW (ref 39–117)
Bilirubin, Direct: 0.1 mg/dL (ref 0.0–0.3)
Total Bilirubin: 0.5 mg/dL (ref 0.2–1.2)
Total Protein: 6.8 g/dL (ref 6.0–8.3)

## 2022-09-25 LAB — URINALYSIS, ROUTINE W REFLEX MICROSCOPIC
Bilirubin Urine: NEGATIVE
Hgb urine dipstick: NEGATIVE
Ketones, ur: NEGATIVE
Leukocytes,Ua: NEGATIVE
Nitrite: NEGATIVE
RBC / HPF: NONE SEEN (ref 0–?)
Specific Gravity, Urine: 1.015 (ref 1.000–1.030)
Total Protein, Urine: NEGATIVE
Urine Glucose: NEGATIVE
Urobilinogen, UA: 0.2 (ref 0.0–1.0)
WBC, UA: NONE SEEN (ref 0–?)
pH: 6 (ref 5.0–8.0)

## 2022-09-25 LAB — BASIC METABOLIC PANEL
BUN: 25 mg/dL — ABNORMAL HIGH (ref 6–23)
CO2: 28 mEq/L (ref 19–32)
Calcium: 9 mg/dL (ref 8.4–10.5)
Chloride: 104 mEq/L (ref 96–112)
Creatinine, Ser: 0.66 mg/dL (ref 0.40–1.20)
GFR: 93.69 mL/min (ref >60.00)
Glucose, Bld: 90 mg/dL (ref 70–99)
Potassium: 4 mEq/L (ref 3.5–5.1)
Sodium: 140 mEq/L (ref 135–145)

## 2022-09-25 LAB — TSH: TSH: 4.46 u[IU]/mL (ref 0.35–5.50)

## 2022-09-25 LAB — VITAMIN B12: Vitamin B-12: 430 pg/mL (ref 211–911)

## 2022-09-25 LAB — VITAMIN D 25 HYDROXY (VIT D DEFICIENCY, FRACTURES): VITD: 75.73 ng/mL (ref 30.00–100.00)

## 2022-09-25 LAB — HEMOGLOBIN A1C: Hgb A1c MFr Bld: 5.6 % (ref 4.6–6.5)

## 2022-09-28 ENCOUNTER — Encounter: Payer: Self-pay | Admitting: Internal Medicine

## 2022-09-28 ENCOUNTER — Ambulatory Visit: Payer: BC Managed Care – PPO | Admitting: Internal Medicine

## 2022-09-28 VITALS — BP 120/72 | HR 59 | Temp 98.0°F | Ht 67.0 in | Wt 140.0 lb

## 2022-09-28 DIAGNOSIS — E7849 Other hyperlipidemia: Secondary | ICD-10-CM

## 2022-09-28 DIAGNOSIS — Z0000 Encounter for general adult medical examination without abnormal findings: Secondary | ICD-10-CM

## 2022-09-28 DIAGNOSIS — M1611 Unilateral primary osteoarthritis, right hip: Secondary | ICD-10-CM | POA: Diagnosis not present

## 2022-09-28 DIAGNOSIS — E669 Obesity, unspecified: Secondary | ICD-10-CM | POA: Diagnosis not present

## 2022-09-28 DIAGNOSIS — Z0001 Encounter for general adult medical examination with abnormal findings: Secondary | ICD-10-CM

## 2022-09-28 DIAGNOSIS — M810 Age-related osteoporosis without current pathological fracture: Secondary | ICD-10-CM

## 2022-09-28 DIAGNOSIS — Z6821 Body mass index (BMI) 21.0-21.9, adult: Secondary | ICD-10-CM | POA: Diagnosis not present

## 2022-09-28 DIAGNOSIS — M2550 Pain in unspecified joint: Secondary | ICD-10-CM

## 2022-09-28 DIAGNOSIS — E039 Hypothyroidism, unspecified: Secondary | ICD-10-CM

## 2022-09-28 MED ORDER — ELETRIPTAN HYDROBROMIDE 40 MG PO TABS
40.0000 mg | ORAL_TABLET | ORAL | 5 refills | Status: DC | PRN
Start: 2022-09-28 — End: 2023-10-03

## 2022-09-28 MED ORDER — LEVOTHYROXINE SODIUM 75 MCG PO TABS
75.0000 ug | ORAL_TABLET | Freq: Every day | ORAL | 3 refills | Status: DC
Start: 2022-09-28 — End: 2023-02-14

## 2022-09-28 MED ORDER — PHENTERMINE HCL 30 MG PO CAPS: 30.0000 mg | ORAL_CAPSULE | ORAL | 1 refills | Status: DC

## 2022-09-28 NOTE — Progress Notes (Unsigned)
Patient ID: [name], female   DOB: 09/09/1960, 62 y.o.   MRN: [id number]         Chief Complaint:: wellness exam and obesity, right hip djd and bursitis, osteopenia, generalizd OA, hold , left heel bone spur       HPI:  [name] is a 62 y.o. female here for wellness exam; for tdap and flu shot at pharmacy, o/w up to date                        Also Pt denies chest pain, increased sob or doe, wheezing, orthopnea, PND, increased LE swelling, palpitations, dizziness or syncope.   Pt denies polydipsia, polyuria, or new focal neuro s/s.    Pt denies fever, wt loss, night sweats, loss of appetite, or other constitutional symptoms  Hard to lose wt,  Has pain to right groin with poppig as well as know right lateral hip pain and tender, worse to lie on this at night.  Tolerating Prolia .  Has known DJD of multiple joints including left heel bone spur, right hip, bilateral knee, bialteral shoudler and ls spine.  Does not want statin for now, trying to work on lower chol diet.     Wt Readings from Last 3 Encounters:  09/28/22 140 lb (63.5 kg)  01/04/22 142 lb (64.4 kg)  01/10/21 138 lb (62.6 kg)   BP Readings from Last 3 Encounters:  09/28/22 120/72  01/04/22 108/62  01/10/21 120/70   Immunization History  Administered Date(s) Administered   Influenza,inj,Quad PF,6+ Mos 10/07/2014, 10/18/2016, 10/21/2017, 11/07/2018, 01/11/2020, 11/12/2020, 01/04/2022   Influenza,inj,quad, With Preservative 11/06/2013, 11/21/2017   Influenza-Unspecified 10/18/2015, 10/18/2016, 11/21/2017   PFIZER(Purple Top)SARS-COV-2 Vaccination 05/04/2019, 05/27/2019   Td 08/05/2001   Tdap 05/30/2012   Zoster Recombinant(Shingrix) 07/28/2019, 10/05/2019   Zoster, Live 08/30/2019   Health Maintenance Due  Topic Date Due   DTaP/Tdap/Td (3 - Td or Tdap) 05/31/2022   INFLUENZA VACCINE  09/06/2022      Past Medical History:  Diagnosis Date   Allergic rhinitis    Anemia    history of anemia secondary  to fibroid sx   Anxiety and depression    Cervical disc disease 07/20/2010   Cyst of ovary    bilateral   Foot fracture, left    hx of   Hyperlipidemia    Hypothyroidism    IBS (irritable bowel syndrome)    Iron deficiency 07/20/2010   Low back pain    Lumbar disc disease 07/20/2010   Osteoporosis 07/20/2010   Post-operative nausea and vomiting    Renal stone 01/11/2020   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 She has never used smokeless tobacco. She reports that she does not drink alcohol and does not use drugs. family history includes Alcohol abuse in her father and mother; Heart attack in her father. Allergies  Allergen Reactions   Sulfa Antibiotics Other (See Comments)   Ciprofloxacin Other (  See Comments)   Sulfonamide Derivatives Other (See Comments)    Pt unknown reaction    Current Outpatient Medications on File Prior to Visit  Medication Sig Dispense Refill   acetaminophen (TYLENOL) 500 MG tablet Take 1,000 mg by mouth every 6 (six) hours as needed for mild pain, moderate pain or headache.     docusate sodium (COLACE) 100 MG capsule Take 200 mg by mouth daily.     No current facility-administered medications on file prior to visit.        ROS:  All others reviewed and negative.  Objective        PE:  BP 120/72 (BP Location: Left Arm, Patient Position: Sitting, Cuff Size: Normal)   Pulse (!) 59   Temp 98 F (36.7 C) (Oral)   Ht 5\' 7"  (1.702 m)   Wt 140 lb (63.5 kg)   SpO2 98%    BMI 21.93 kg/m                 Constitutional: Pt appears in NAD               HENT: Head: NCAT.                Right Ear: External ear normal.                 Left Ear: External ear normal.                Eyes: . Pupils are equal, round, and reactive to light. Conjunctivae and EOM are normal               Nose: without d/c or deformity               Neck: Neck supple. Gross normal ROM               Cardiovascular: Normal rate and regular rhythm.                 Pulmonary/Chest: Effort normal and breath sounds without rales or wheezing.                Abd:  Soft, NT, ND, + BS, no organomegaly               Neurological: Pt is alert. At baseline orientation, motor grossly intact               Skin: Skin is warm. No rashes, no other new lesions, LE edema - none               Psychiatric: Pt behavior is normal without agitation   Micro: none  Cardiac tracings I have personally interpreted today:  none  Pertinent Radiological findings (summarize): none   Lab Results  Component Value Date   WBC 4.7 09/25/2022   HGB 12.6 09/25/2022   HCT 37.6 09/25/2022   PLT 194.0 09/25/2022   GLUCOSE 90 09/25/2022   CHOL 258 (H) 09/25/2022   TRIG 52.0 09/25/2022   HDL 92.90 09/25/2022   LDLDIRECT 95.5 07/20/2010   LDLCALC 154 (H) 09/25/2022   ALT 11 09/25/2022   AST 17 09/25/2022   NA 140 09/25/2022   K 4.0 09/25/2022   CL 104 09/25/2022   CREATININE 0.66 09/25/2022   BUN 25 (H) 09/25/2022   CO2 28 09/25/2022   TSH 4.46 09/25/2022   HGBA1C 5.6 09/25/2022   Assessment/Plan:  [name] is a 62 y.o. White or Caucasian [1] female with  has  a past medical history of Allergic rhinitis, Anemia, Anxiety and depression, Cervical disc disease (07/20/2010), Cyst of ovary, Foot fracture, left, Hyperlipidemia, Hypothyroidism, IBS (irritable bowel syndrome), Iron deficiency (07/20/2010), Low back pain, Lumbar disc disease (07/20/2010), Osteoporosis (07/20/2010), Post-operative nausea and  vomiting, Renal stone (01/11/2020), Symptomatic PVCs (07/20/2010), and Vitamin D deficiency (07/20/2010).  Encounter for well adult exam with abnormal findings Age and sex appropriate education and counseling updated with regular exercise and diet Referrals for preventative services - none needed Immunizations addressed - for tdap and flu shot at pharmacy Smoking counseling  - none needed Evidence for depression or other mood disorder - none significant Most recent labs reviewed. I have personally reviewed and have noted: 1) the patient's medical and social history 2) The patient's current medications and supplements 3) The patient's height, weight, and BMI have been recorded in the chart   HLD (hyperlipidemia) Lab Results  Component Value Date   LDLCALC 154 (H) 09/25/2022   Uncontrolled, decliens statin for now, but ok for refer nutrition, and consider card Ct score    Hypothyroidism Lab Results  Component Value Date   TSH 4.46 09/25/2022   Stable, pt to continue levothyroxine 75 mcg every day      Osteoporosis Tolerating prolia well, cont current prolia  Polyarthralgia Due to generalized OA - for tylenol prn  Arthritis of right hip Worsening recent, also has right hip bursitis, for referral ortho Dr Veda Canning  Obesity (BMI 30-39.9) Ok for phentermine 30 mg qd  Followup: No follow-ups on file.  Oliver Barre, MD 09/29/2022 9:22 PM [facility]

## 2022-09-28 NOTE — Patient Instructions (Signed)
Please call if you would like the Nexlitol for cholesterol (or even repatha)  Please take all new medication as prescribed - the phentermine lower dose  Please continue all other medications as before, and refills have been done if requested.  Please have the pharmacy call with any other refills you may need.  Please continue your efforts at being more active, low cholesterol diet, and weight control.  You are otherwise up to date with prevention measures today.  Please keep your appointments with your specialists as you may have planned  You will be contacted regarding the referral for: Ortho - Dr Veda Canning, and Nutrition  Please make an Appointment to return for your 1 year visit, or sooner if needed, with Lab testing by Appointment as well, to be done about 3-5 days before at the FIRST FLOOR Lab (so this is for TWO appointments - please see the scheduling desk as you leave)

## 2022-09-29 ENCOUNTER — Encounter: Payer: Self-pay | Admitting: Internal Medicine

## 2022-09-29 DIAGNOSIS — M2550 Pain in unspecified joint: Secondary | ICD-10-CM | POA: Insufficient documentation

## 2022-09-29 DIAGNOSIS — E669 Obesity, unspecified: Secondary | ICD-10-CM | POA: Insufficient documentation

## 2022-09-29 DIAGNOSIS — M1611 Unilateral primary osteoarthritis, right hip: Secondary | ICD-10-CM | POA: Insufficient documentation

## 2022-09-29 NOTE — Assessment & Plan Note (Signed)
Ok for phentermine 30 mg qd

## 2022-09-29 NOTE — Assessment & Plan Note (Signed)
Lab Results  Component Value Date   TSH 4.46 09/25/2022   Stable, pt to continue levothyroxine 75 mcg every day

## 2022-09-29 NOTE — Assessment & Plan Note (Signed)
Due to generalized OA - for tylenol prn

## 2022-09-29 NOTE — Assessment & Plan Note (Signed)
Lab Results  Component Value Date   LDLCALC 154 (H) 09/25/2022   Uncontrolled, decliens statin for now, but ok for refer nutrition, and consider card Ct score

## 2022-09-29 NOTE — Assessment & Plan Note (Signed)
Worsening recent, also has right hip bursitis, for referral ortho Dr Veda Canning

## 2022-09-29 NOTE — Assessment & Plan Note (Signed)
Age and sex appropriate education and counseling updated with regular exercise and diet Referrals for preventative services - none needed Immunizations addressed - for tdap and flu shot at pharmacy Smoking counseling  - none needed Evidence for depression or other mood disorder - none significant Most recent labs reviewed. I have personally reviewed and have noted: 1) the patient's medical and social history 2) The patient's current medications and supplements 3) The patient's height, weight, and BMI have been recorded in the chart

## 2022-09-29 NOTE — Assessment & Plan Note (Signed)
Tolerating prolia well, cont current prolia

## 2022-12-04 DIAGNOSIS — F40232 Fear of other medical care: Secondary | ICD-10-CM | POA: Insufficient documentation

## 2023-01-07 DIAGNOSIS — M25551 Pain in right hip: Secondary | ICD-10-CM | POA: Insufficient documentation

## 2023-02-14 ENCOUNTER — Other Ambulatory Visit: Payer: Self-pay | Admitting: Internal Medicine

## 2023-02-14 ENCOUNTER — Other Ambulatory Visit: Payer: Self-pay

## 2023-03-19 ENCOUNTER — Telehealth: Payer: Self-pay

## 2023-03-19 ENCOUNTER — Telehealth: Payer: Self-pay | Admitting: Internal Medicine

## 2023-03-19 NOTE — Telephone Encounter (Signed)
Prolia VOB initiated via [email or web address]

## 2023-03-19 NOTE — Telephone Encounter (Signed)
Message sent to PA team to verify with insurance.

## 2023-03-19 NOTE — Telephone Encounter (Unsigned)
Copied from CRM [id number]. Topic: Appointments - Scheduling Inquiry for Clinic >> Mar 19, 2023 11:35 AM Mackie Pai E wrote: Reason for CRM: Patient called in wanting to schedule an appointment for her Prolia shot. Saw in chart that her last one was done 08/16/2022, and the next one should be in 6 months from that July date. Told patient that clinic will give her a call back to schedule this appointment. Patient callback number is [phone].

## 2023-03-21 ENCOUNTER — Other Ambulatory Visit (HOSPITAL_COMMUNITY): Payer: Self-pay

## 2023-03-21 NOTE — Telephone Encounter (Signed)
Pharmacy Patient Advocate Encounter   Received notification from  [employer] Portal that prior authorization for Prolia is required/requested.   Insurance verification completed.   The patient is insured through [employer] .   Per test claim: PA required; PA submitted to above mentioned insurance via [employer] Key/confirmation #/EOC [id number] Status is pending

## 2023-03-21 NOTE — Telephone Encounter (Signed)
Marland Kitchen

## 2023-03-25 ENCOUNTER — Other Ambulatory Visit (HOSPITAL_COMMUNITY): Payer: Self-pay

## 2023-03-25 NOTE — Telephone Encounter (Signed)
Pt ready for scheduling for Prolia on or after : 03/25/23  Out-of-pocket cost due at time of visit: $40  Number of injection/visits approved: --  Primary: [employer] - Hydrologist co-insurance: $40 Admin fee co-insurance: --  Secondary: N/A Prolia co-insurance:  Admin fee co-insurance:   Medical Benefit Details: Date Benefits were checked: 03/19/23 Deductible: no/ Coinsurance: $40/ Admin Fee: --  Prior Auth: Approved PA# [id number] Expiration Date: 03/21/23 to 03/19/24  # of doses approved:  Pharmacy benefit: Copay $-- If patient wants fill through the pharmacy benefit please send prescription to:  -- , and include estimated need by date in rx notes. Pharmacy will ship medication directly to the office.  Patient may be eligible for Prolia Copay Card. Copay Card can make patient's cost as little as $25. Link to apply: [email or web address]://www.[email or web address]  ** This summary of benefits is an estimation of the patient's out-of-pocket cost. Exact cost may very based on individual plan coverage.

## 2023-03-25 NOTE — Telephone Encounter (Signed)
Pharmacy Patient Advocate Encounter  Received notification from [employer] that Prior Authorization for Prolia has been APPROVED from 03/21/23 to 03/19/24   PA #/Case ID/Reference #: [id number]

## 2023-03-27 NOTE — Telephone Encounter (Signed)
 Marland Kitchen

## 2023-03-27 NOTE — Telephone Encounter (Signed)
Copied from CRM [id number]. Topic: Appointments - Appointment Scheduling >> Mar 27, 2023  9:58 AM Sim Boast F wrote: Patient called to set up Prolia injection, per note on 2/17 it is okay to schedule and the copay is $40. I tried to schedule patient for Monday but I did not have access, I did call patient back left her a voicemail letting her know that someone will call her to schedule this.  ---  Looks good to me, please verify that this pt is good to be scheduled.

## 2023-03-28 ENCOUNTER — Telehealth: Payer: Self-pay

## 2023-03-28 DIAGNOSIS — E7800 Pure hypercholesterolemia, unspecified: Secondary | ICD-10-CM

## 2023-03-28 NOTE — Telephone Encounter (Signed)
Copied from CRM [id number]. Topic: Clinical - Request for Lab/Test Order >> Mar 28, 2023 12:43 PM Gurney Maxin H wrote:  Reason for CRM: Patient states she wants lab orders put in to have her Magnesium and cholesterol checked to see if levels have dropped.

## 2023-03-28 NOTE — Telephone Encounter (Signed)
 Ok labs are ordered

## 2023-03-28 NOTE — Addendum Note (Signed)
Addended by: Corwin Levins on: 03/28/2023 02:06 PM   Modules accepted: Orders

## 2023-03-29 ENCOUNTER — Ambulatory Visit: Payer: 59

## 2023-03-29 ENCOUNTER — Other Ambulatory Visit (INDEPENDENT_AMBULATORY_CARE_PROVIDER_SITE_OTHER): Payer: 59

## 2023-03-29 ENCOUNTER — Encounter: Payer: Self-pay | Admitting: Internal Medicine

## 2023-03-29 DIAGNOSIS — E7800 Pure hypercholesterolemia, unspecified: Secondary | ICD-10-CM

## 2023-03-29 DIAGNOSIS — M810 Age-related osteoporosis without current pathological fracture: Secondary | ICD-10-CM

## 2023-03-29 LAB — LIPID PANEL
Cholesterol: 239 mg/dL — ABNORMAL HIGH (ref 0–200)
HDL: 100.6 mg/dL (ref >39.00)
LDL Cholesterol: 120 mg/dL — ABNORMAL HIGH (ref 0–99)
NonHDL: 138.88
Total CHOL/HDL Ratio: 2
Triglycerides: 94 mg/dL (ref 0.0–149.0)
VLDL: 18.8 mg/dL (ref 0.0–40.0)

## 2023-03-29 LAB — MAGNESIUM: Magnesium: 2.1 mg/dL (ref 1.5–2.5)

## 2023-03-29 MED ORDER — DENOSUMAB 60 MG/ML ~~LOC~~ SOSY
60.0000 mg | PREFILLED_SYRINGE | Freq: Once | SUBCUTANEOUS | Status: AC
  Administered 2023-03-29: 60 mg via SUBCUTANEOUS

## 2023-03-29 MED ORDER — DENOSUMAB 60 MG/ML ~~LOC~~ SOSY
60.0000 mg | PREFILLED_SYRINGE | Freq: Once | SUBCUTANEOUS | Status: AC
  Administered 2023-10-31: 60 mg via SUBCUTANEOUS

## 2023-03-29 NOTE — Telephone Encounter (Signed)
Patient is cleared for PROLIA injection after 03/19/2023.  Mentioned in provider note last on 09/28/2022. Medication is CLINIC supplied. CoPay:$40

## 2023-03-29 NOTE — Telephone Encounter (Signed)
Called and got Pt scheduled for Prolia.

## 2023-03-29 NOTE — Progress Notes (Signed)
 Patient visits today for their prolia injection. Patient informed of what they had received and tolerated injection well. Patient notified to reach out to office if needed.

## 2023-03-29 NOTE — Telephone Encounter (Signed)
 Called and let Pt know

## 2023-04-08 ENCOUNTER — Ambulatory Visit: Payer: 59 | Admitting: Internal Medicine

## 2023-04-17 ENCOUNTER — Other Ambulatory Visit: Payer: Self-pay | Admitting: Internal Medicine

## 2023-08-12 ENCOUNTER — Ambulatory Visit: Admitting: Internal Medicine

## 2023-08-14 ENCOUNTER — Ambulatory Visit: Admitting: Internal Medicine

## 2023-08-26 ENCOUNTER — Other Ambulatory Visit (HOSPITAL_COMMUNITY): Payer: Self-pay

## 2023-08-26 ENCOUNTER — Telehealth: Payer: Self-pay

## 2023-08-26 NOTE — Telephone Encounter (Signed)
 Prolia  VOB initiated via [email or web address]  Next Prolia  inj DUE: 09/26/23

## 2023-08-26 NOTE — Telephone Encounter (Signed)
 [name]

## 2023-08-26 NOTE — Telephone Encounter (Addendum)
 MEDICAL     PHARMACY   PA submitted via CMM. Key: [id number]  PA approved. Effective 08/26/23-08/25/24. PA# [id number]

## 2023-08-26 NOTE — Telephone Encounter (Signed)
 Pt ready for scheduling for PROLIA  on or after : 09/26/23  Option# 1: Buy/Bill (Office supplied medication)  Out-of-pocket cost due at time of clinic visit: $80  Number of injection/visits approved: 2  Primary: [employer] Prolia  co-insurance: $40 Admin fee co-insurance: $40  Secondary: --- Prolia  co-insurance:  Admin fee co-insurance:   Medical Benefit Details: Date Benefits were checked: 08/26/23 Deductible: NO/ Coinsurance: $40/ Admin Fee: $40  Prior Auth: APPROVED PA# [id number] Expiration Date: 03/21/23-03/19/24  # of doses approved: 2 ----------------------------------------------------------------------- Option# 2- Med Obtained from pharmacy:  Pharmacy benefit: Copay $--- MUST FILL AT SPECIALTY (Paid to pharmacy) Admin Fee: $40 (Pay at clinic)  Prior Auth: APPROVED PA# [id number]  Expiration Date: 08/26/23-08/25/24  # of doses approved: 2   If patient wants fill through the pharmacy benefit please send prescription to: [employer], and include estimated need by date in rx notes. Pharmacy will ship medication directly to the office.  Patient IS eligible for Prolia  Copay Card. Copay Card can make patient's cost as little as $25. Link to apply: [email or web address]://www.[email or web address]/copay  ** This summary of benefits is an estimation of the patient's out-of-pocket cost. Exact cost may very based on individual plan coverage.

## 2023-09-12 ENCOUNTER — Other Ambulatory Visit: Payer: Self-pay | Admitting: Internal Medicine

## 2023-09-30 ENCOUNTER — Telehealth: Payer: Self-pay

## 2023-09-30 ENCOUNTER — Encounter: Payer: Self-pay | Admitting: Internal Medicine

## 2023-09-30 ENCOUNTER — Encounter: Payer: 59 | Admitting: Internal Medicine

## 2023-09-30 DIAGNOSIS — E7800 Pure hypercholesterolemia, unspecified: Secondary | ICD-10-CM

## 2023-09-30 DIAGNOSIS — R739 Hyperglycemia, unspecified: Secondary | ICD-10-CM

## 2023-09-30 DIAGNOSIS — E559 Vitamin D deficiency, unspecified: Secondary | ICD-10-CM

## 2023-09-30 DIAGNOSIS — E538 Deficiency of other specified B group vitamins: Secondary | ICD-10-CM

## 2023-09-30 NOTE — Telephone Encounter (Signed)
 Copied from CRM #[id number]. Topic: Clinical - Request for Lab/Test Order >> Sep 30, 2023  4:01 PM Rosina BIRCH wrote: Reason for CRM: patient called stating she would like to get her lab work done before her physical on 8/28 CB [phone]

## 2023-10-02 ENCOUNTER — Other Ambulatory Visit

## 2023-10-02 ENCOUNTER — Other Ambulatory Visit (INDEPENDENT_AMBULATORY_CARE_PROVIDER_SITE_OTHER)

## 2023-10-02 DIAGNOSIS — R739 Hyperglycemia, unspecified: Secondary | ICD-10-CM | POA: Diagnosis not present

## 2023-10-02 DIAGNOSIS — E538 Deficiency of other specified B group vitamins: Secondary | ICD-10-CM

## 2023-10-02 DIAGNOSIS — E7800 Pure hypercholesterolemia, unspecified: Secondary | ICD-10-CM | POA: Diagnosis not present

## 2023-10-02 LAB — LIPID PANEL
Cholesterol: 263 mg/dL — ABNORMAL HIGH (ref 0–200)
HDL: 109.9 mg/dL (ref >39.00)
LDL Cholesterol: 143 mg/dL — ABNORMAL HIGH (ref 0–99)
NonHDL: 153.02
Total CHOL/HDL Ratio: 2
Triglycerides: 52 mg/dL (ref 0.0–149.0)
VLDL: 10.4 mg/dL (ref 0.0–40.0)

## 2023-10-02 LAB — CBC WITH DIFFERENTIAL/PLATELET
Basophils Absolute: 0.1 K/uL (ref 0.0–0.1)
Basophils Relative: 0.9 % (ref 0.0–3.0)
Eosinophils Absolute: 0.2 K/uL (ref 0.0–0.7)
Eosinophils Relative: 3.8 % (ref 0.0–5.0)
HCT: 38.2 % (ref 36.0–46.0)
Hemoglobin: 12.8 g/dL (ref 12.0–15.0)
Lymphocytes Relative: 36.2 % (ref 12.0–46.0)
Lymphs Abs: 2 K/uL (ref 0.7–4.0)
MCHC: 33.5 g/dL (ref 30.0–36.0)
MCV: 93.4 fl (ref 78.0–100.0)
Monocytes Absolute: 0.6 K/uL (ref 0.1–1.0)
Monocytes Relative: 11.2 % (ref 3.0–12.0)
Neutro Abs: 2.7 K/uL (ref 1.4–7.7)
Neutrophils Relative %: 47.9 % (ref 43.0–77.0)
Platelets: 183 K/uL (ref 150.0–400.0)
RBC: 4.09 Mil/uL (ref 3.87–5.11)
RDW: 14.1 % (ref 11.5–15.5)
WBC: 5.6 K/uL (ref 4.0–10.5)

## 2023-10-02 LAB — VITAMIN D 25 HYDROXY (VIT D DEFICIENCY, FRACTURES): VITD: 65.53 ng/mL (ref 30.00–100.00)

## 2023-10-02 LAB — URINALYSIS, ROUTINE W REFLEX MICROSCOPIC
Bilirubin Urine: NEGATIVE
Hgb urine dipstick: NEGATIVE
Ketones, ur: NEGATIVE
Leukocytes,Ua: NEGATIVE
Nitrite: NEGATIVE
RBC / HPF: NONE SEEN (ref 0–?)
Specific Gravity, Urine: 1.015 (ref 1.000–1.030)
Total Protein, Urine: NEGATIVE
Urine Glucose: NEGATIVE
Urobilinogen, UA: 0.2 (ref 0.0–1.0)
pH: 6.5 (ref 5.0–8.0)

## 2023-10-02 LAB — BASIC METABOLIC PANEL WITH GFR
BUN: 31 mg/dL — ABNORMAL HIGH (ref 6–23)
CO2: 29 meq/L (ref 19–32)
Calcium: 9.3 mg/dL (ref 8.4–10.5)
Chloride: 102 meq/L (ref 96–112)
Creatinine, Ser: 0.71 mg/dL (ref 0.40–1.20)
GFR: 90.16 mL/min (ref >60.00)
Glucose, Bld: 80 mg/dL (ref 70–99)
Potassium: 4.2 meq/L (ref 3.5–5.1)
Sodium: 142 meq/L (ref 135–145)

## 2023-10-02 LAB — HEPATIC FUNCTION PANEL
ALT: 12 U/L (ref 0–35)
AST: 18 U/L (ref 0–37)
Albumin: 4.5 g/dL (ref 3.5–5.2)
Alkaline Phosphatase: 33 U/L — ABNORMAL LOW (ref 39–117)
Bilirubin, Direct: 0.1 mg/dL (ref 0.0–0.3)
Total Bilirubin: 0.4 mg/dL (ref 0.2–1.2)
Total Protein: 6.9 g/dL (ref 6.0–8.3)

## 2023-10-02 LAB — HEMOGLOBIN A1C: Hgb A1c MFr Bld: 5.9 % (ref 4.6–6.5)

## 2023-10-02 LAB — VITAMIN B12: Vitamin B-12: 596 pg/mL (ref 211–911)

## 2023-10-02 LAB — TSH: TSH: 4.13 u[IU]/mL (ref 0.35–5.50)

## 2023-10-02 NOTE — Telephone Encounter (Signed)
 Labs have since been ordered

## 2023-10-03 ENCOUNTER — Ambulatory Visit (INDEPENDENT_AMBULATORY_CARE_PROVIDER_SITE_OTHER): Admitting: Internal Medicine

## 2023-10-03 ENCOUNTER — Encounter: Payer: Self-pay | Admitting: Internal Medicine

## 2023-10-03 ENCOUNTER — Telehealth: Payer: Self-pay

## 2023-10-03 VITALS — BP 94/62 | HR 58 | Temp 98.1°F | Ht 67.0 in | Wt 138.6 lb

## 2023-10-03 DIAGNOSIS — M545 Low back pain, unspecified: Secondary | ICD-10-CM

## 2023-10-03 DIAGNOSIS — E7849 Other hyperlipidemia: Secondary | ICD-10-CM | POA: Diagnosis not present

## 2023-10-03 DIAGNOSIS — G43009 Migraine without aura, not intractable, without status migrainosus: Secondary | ICD-10-CM

## 2023-10-03 DIAGNOSIS — G8929 Other chronic pain: Secondary | ICD-10-CM

## 2023-10-03 DIAGNOSIS — R739 Hyperglycemia, unspecified: Secondary | ICD-10-CM | POA: Diagnosis not present

## 2023-10-03 DIAGNOSIS — E559 Vitamin D deficiency, unspecified: Secondary | ICD-10-CM

## 2023-10-03 DIAGNOSIS — E039 Hypothyroidism, unspecified: Secondary | ICD-10-CM

## 2023-10-03 DIAGNOSIS — Z23 Encounter for immunization: Secondary | ICD-10-CM | POA: Diagnosis not present

## 2023-10-03 DIAGNOSIS — Z0001 Encounter for general adult medical examination with abnormal findings: Secondary | ICD-10-CM | POA: Diagnosis not present

## 2023-10-03 DIAGNOSIS — E538 Deficiency of other specified B group vitamins: Secondary | ICD-10-CM

## 2023-10-03 DIAGNOSIS — E7800 Pure hypercholesterolemia, unspecified: Secondary | ICD-10-CM

## 2023-10-03 MED ORDER — ELETRIPTAN HYDROBROMIDE 40 MG PO TABS: 40.0000 mg | ORAL_TABLET | ORAL | 5 refills | Status: AC | PRN

## 2023-10-03 MED ORDER — NURTEC 75 MG PO TBDP: ORAL_TABLET | ORAL | 11 refills | Status: DC

## 2023-10-03 MED ORDER — LEVOTHYROXINE SODIUM 75 MCG PO TABS: 75.0000 ug | ORAL_TABLET | Freq: Every day | ORAL | 3 refills | Status: DC

## 2023-10-03 MED ORDER — METHOCARBAMOL 500 MG PO TABS: 500.0000 mg | ORAL_TABLET | Freq: Four times a day (QID) | ORAL | 3 refills | Status: DC

## 2023-10-03 NOTE — Assessment & Plan Note (Signed)
 Has underlying lumbar djd ddd, but also left lower back paraspinal spasm today - ok for robaxin  500 mg qid prn

## 2023-10-03 NOTE — Patient Instructions (Signed)
 You had the Prevnar 20 pneumonia shot today  Please take all new medication as prescribed - the Nurtec 75 mg every other day preventive for migraines  Please take all new medication as prescribed - the robaxin  as needed for left lower back pain or muscle cramps  Please continue all other medications as before, and refills have been done if requested.  Please have the pharmacy call with any other refills you may need.  Please continue your efforts at being more active, low cholesterol diet, and weight control.  You are otherwise up to date with prevention measures today.  Please keep your appointments with your specialists as you may have planned  Please make an Appointment to return for your 1 year visit, or sooner if needed, with Lab testing by Appointment as well, to be done about 3-5 days before at the FIRST FLOOR Lab (so this is for TWO appointments - please see the scheduling desk as you leave)

## 2023-10-03 NOTE — Progress Notes (Unsigned)
 Patient ID: [name], female   DOB: August 08, 1960, 63 y.o.   MRN: [id number]         Chief Complaint:: wellness exam and left low back pain, migraine, hld, low thyroid        HPI:  [name] is a 63 y.o. female here for wellness exam; has seen gyn with DXA c/w osteopenia.  Getting Prolia  regularly now for 8 yrs.  For tdap and flu shot at the pharmacy, o/w up to date  Due for prevnar 20 today                        Also did not tolerate phentermine  with constipation it seems.  Pt denies chest pain, increased sob or doe, wheezing, orthopnea, PND, increased LE swelling, palpitations, dizziness or syncope   Pt denies polydipsia, polyuria, or new focal neuro s/s.  Pt denies fever, wt loss, night sweats, loss of appetite, or other constitutional symptoms  Denies worsening depressive symptoms, suicidal ideation, or panic; has ongoing anxiety overall stable except has recurring now more fequent migraines at several per week, with relpax  no longer working as well.  Denies hyper or hypo thyroid  symptoms such as voice, skin or hair change.  Pt continues to have recurring chronic left LBP with worsening freq and severity and last ESI did not last long enough, but no bowel or bladder change, fever, wt loss,  worsening LE pain/numbness/weakness, gait change or falls.   Wt Readings from Last 3 Encounters:  10/03/23 138 lb 9.6 oz (62.9 kg)  09/28/22 140 lb (63.5 kg)  01/04/22 142 lb (64.4 kg)   BP Readings from Last 3 Encounters:  10/03/23 94/62  09/28/22 120/72  01/04/22 108/62   Immunization History  Administered Date(s) Administered   Influenza,inj,Quad PF,6+ Mos 10/07/2014, 10/18/2016, 10/21/2017, 11/07/2018, 01/11/2020, 11/12/2020, 01/04/2022   Influenza,inj,quad, With Preservative 11/06/2013, 11/21/2017   Influenza-Unspecified 10/18/2015, 10/18/2016, 11/21/2017   PFIZER(Purple Top)SARS-COV-2 Vaccination 05/04/2019, 05/27/2019   Td 08/05/2001   Tdap 05/30/2012   Zoster  Recombinant(Shingrix) 07/28/2019, 10/05/2019   Zoster, Live 08/30/2019   Health Maintenance Due  Topic Date Due   Pneumococcal Vaccine: 50+ Years (1 of 1 - PCV) Never done   Cervical Cancer Screening (HPV/Pap Cotest)  10/11/2013   DTaP/Tdap/Td (3 - Td or Tdap) 05/31/2022   INFLUENZA VACCINE  09/06/2023      Past Medical History:  Diagnosis Date   Allergic rhinitis    Allergy    Anemia    history of anemia secondary to fibroid sx   Anxiety and depression    Arthritis    Cervical disc disease 07/20/2010   Cyst of ovary    bilateral   Foot fracture, left    hx of   GERD (gastroesophageal reflux disease)    Hyperlipidemia    Hypothyroidism    IBS (irritable bowel syndrome)    Iron deficiency 07/20/2010   Low back pain    Lumbar disc disease 07/20/2010   Neuromuscular disorder (HCC)    Osteoporosis 07/20/2010   Post-operative nausea and vomiting    Renal stone 01/11/2020   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 She has never used smokeless tobacco. She reports that she does not drink alcohol and does not use drugs. family history includes Alcohol abuse in her father and mother; Heart attack in her father; Heart disease in her father; Varicose Veins in her mother. Allergies  Allergen Reactions   Sulfa Antibiotics Other (See Comments)   Ciprofloxacin  Other (See Comments)   Sulfonamide Derivatives Other (See Comments)    Pt unknown reaction    Current  Outpatient Medications on File Prior to Visit  Medication Sig Dispense Refill   acetaminophen  (TYLENOL ) 500 MG tablet Take 1,000 mg by mouth every 6 (six) hours as needed for mild pain, moderate pain or headache.     docusate sodium (COLACE) 100 MG capsule Take 200 mg by mouth daily.     Current Facility-Administered Medications on File Prior to Visit  Medication Dose Route Frequency Provider Last Rate Last Admin   denosumab  (PROLIA ) injection 60 mg  60 mg Subcutaneous Once Norleen Lynwood ORN, MD            ROS:  All others reviewed and negative.  Objective        PE:  BP 94/62   Pulse (!) 58   Temp 98.1 F (36.7 C)   Ht 5' 7 (1.702 m)   Wt 138 lb 9.6 oz (62.9 kg)   SpO2 96%   BMI 21.71 kg/m                 Constitutional: Pt appears in NAD               HENT: Head: NCAT.                Right Ear: External ear normal.                 Left Ear: External ear normal.                Eyes: . Pupils are equal, round, and reactive to light. Conjunctivae and EOM are normal               Nose: without d/c or deformity               Neck: Neck supple. Gross normal ROM               Cardiovascular: Normal rate and regular rhythm.                 Pulmonary/Chest: Effort normal and breath sounds without rales or wheezing.                Abd:  Soft, NT, ND, + BS, no organomegaly, no flank tender but has left lower paraspinal spasm tender               Neurological: Pt is alert. At baseline orientation, motor grossly intact               Skin: Skin is warm. No rashes, no other new lesions, LE edema - none               Psychiatric: Pt behavior is normal without agitation   Micro: none  Cardiac tracings I have personally interpreted today:  none  Pertinent Radiological findings (summarize): none  Lab Results  Component Value Date   WBC 5.6 10/02/2023   HGB 12.8 10/02/2023   HCT 38.2 10/02/2023   PLT 183.0 10/02/2023   GLUCOSE 80 10/02/2023   CHOL 263 (H) 10/02/2023   TRIG 52.0  10/02/2023   HDL 109.90 10/02/2023   LDLDIRECT 95.5 07/20/2010   LDLCALC 143 (H) 10/02/2023   ALT 12 10/02/2023   AST 18 10/02/2023   NA 142 10/02/2023   K 4.2 10/02/2023   CL 102 10/02/2023   CREATININE 0.71 10/02/2023   BUN 31 (H) 10/02/2023   CO2 29 10/02/2023   TSH 4.13 10/02/2023   HGBA1C 5.9 10/02/2023   Assessment/Plan:  Darcelle P Quinlivan is a 63 y.o. White or Caucasian [1] female with  has a past medical history of Allergic rhinitis, Allergy, Anemia, Anxiety and depression, Arthritis, Cervical disc disease (07/20/2010), Cyst of ovary, Foot fracture, left, GERD (gastroesophageal reflux disease), Hyperlipidemia, Hypothyroidism, IBS (irritable bowel syndrome), Iron deficiency (07/20/2010), Low back pain, Lumbar disc disease (07/20/2010), Neuromuscular disorder (HCC), Osteoporosis (07/20/2010), Post-operative nausea and vomiting, Renal stone (01/11/2020), Symptomatic PVCs (07/20/2010), and Vitamin D  deficiency (07/20/2010).  Encounter for well adult exam with abnormal findings Age and sex appropriate education and counseling updated with regular exercise and diet Referrals for preventative services - none needed Immunizations addressed - for prevnar 20, for tdap and flu at the pharmacy Smoking counseling  - none needed Evidence for depression or other mood disorder - stable chronic anxiety Most recent labs reviewed. I have personally reviewed and have noted: 1) the patient's medical and social history 2) The patient's current medications and supplements 3) The patient's height, weight, and BMI have been recorded in the chart   COMMON MIGRAINE Mod worsening frequency and severity,ok for nurtec 75 mg every other day preventive, and  to f/u any worsening symptoms or concerns   HLD (hyperlipidemia) Lab Results  Component Value Date   LDLCALC 143 (H) 10/02/2023   Uncontrolled, pt to continue lower chol diet, declines statin with recent hx of card CT score of Zero.     Hypothyroidism Lab Results  Component Value Date   TSH 4.13 10/02/2023   Stable, pt to continue levothyroxine  75 mcg qd   LOW BACK PAIN Has underlying lumbar djd ddd, but also left lower back paraspinal spasm today - ok for robaxin  500 mg qid prn  Followup: Return in about 1 year (around 10/02/2024).  Lynwood Rush, MD 10/03/2023 1:05 PM [facility]

## 2023-10-03 NOTE — Assessment & Plan Note (Signed)
 Lab Results  Component Value Date   TSH 4.13 10/02/2023   Stable, pt to continue levothyroxine  75 mcg qd

## 2023-10-03 NOTE — Assessment & Plan Note (Signed)
 Mod worsening frequency and severity,ok for nurtec 75 mg every other day preventive, and  to f/u any worsening symptoms or concerns

## 2023-10-03 NOTE — Assessment & Plan Note (Signed)
 Age and sex appropriate education and counseling updated with regular exercise and diet Referrals for preventative services - none needed Immunizations addressed - for prevnar 20, for tdap and flu at the pharmacy Smoking counseling  - none needed Evidence for depression or other mood disorder - stable chronic anxiety Most recent labs reviewed. I have personally reviewed and have noted: 1) the patient's medical and social history 2) The patient's current medications and supplements 3) The patient's height, weight, and BMI have been recorded in the chart

## 2023-10-03 NOTE — Telephone Encounter (Signed)
 Pharmacy Patient Advocate Encounter   Received notification from CoverMyMeds that prior authorization for Nurtec 75MG  dispersible tablets  is required/requested.   Insurance verification completed.   The patient is insured through [facility] .   Per test claim: PA required; PA submitted to above mentioned insurance via [employer] Key/confirmation #/EOC [id number] Status is pending

## 2023-10-03 NOTE — Assessment & Plan Note (Signed)
 Lab Results  Component Value Date   LDLCALC 143 (H) 10/02/2023   Uncontrolled, pt to continue lower chol diet, declines statin with recent hx of card CT score of Zero.

## 2023-10-04 ENCOUNTER — Other Ambulatory Visit (HOSPITAL_COMMUNITY): Payer: Self-pay

## 2023-10-04 ENCOUNTER — Encounter: Payer: Self-pay | Admitting: Internal Medicine

## 2023-10-08 NOTE — Telephone Encounter (Signed)
 Pharmacy Patient Advocate Encounter  Received notification from [employer] that Prior Authorization for Nurtec 75MG  dispersible tablets  has been APPROVED from 10/04/2023 to 10/03/2024   PA #/Case ID/Reference #: [id number]

## 2023-10-09 NOTE — Telephone Encounter (Signed)
 Messaged patient and informed them of approval

## 2023-10-23 ENCOUNTER — Telehealth: Payer: Self-pay | Admitting: Radiology

## 2023-10-23 NOTE — Telephone Encounter (Signed)
 Copied from CRM [id number]. Topic: Appointments - Scheduling Inquiry for Clinic >> Oct 23, 2023  8:18 AM Anairis L wrote: Reason for CRM: Patient is calling to schedule prolia  Shots, may contact via Mychart.She prefers morning next week.

## 2023-10-31 ENCOUNTER — Encounter: Payer: Self-pay | Admitting: Internal Medicine

## 2023-10-31 ENCOUNTER — Ambulatory Visit: Admitting: Internal Medicine

## 2023-10-31 DIAGNOSIS — M810 Age-related osteoporosis without current pathological fracture: Secondary | ICD-10-CM

## 2023-10-31 MED ORDER — DENOSUMAB 60 MG/ML ~~LOC~~ SOSY: 60.0000 mg | PREFILLED_SYRINGE | SUBCUTANEOUS | Status: AC

## 2023-10-31 NOTE — Progress Notes (Signed)
 Pt was in office today for Prolia  injection, injection was given in L arm subcutaneous and pt tolerated well.

## 2023-10-31 NOTE — Patient Instructions (Signed)
 Pt seen only for prolia 

## 2023-10-31 NOTE — Progress Notes (Signed)
 Patient ID: [name], female   DOB: 06-26-1960, 63 y.o.   MRN: [id number]  Pt seen for prolia  only

## 2023-11-04 NOTE — Telephone Encounter (Signed)
 Patient has since had prolia  injection

## 2023-11-04 NOTE — Telephone Encounter (Signed)
 Pt has been given Prolia  on 10/31/23.

## 2023-12-25 ENCOUNTER — Telehealth: Admitting: Physician Assistant

## 2023-12-25 DIAGNOSIS — B9689 Other specified bacterial agents as the cause of diseases classified elsewhere: Secondary | ICD-10-CM

## 2023-12-25 DIAGNOSIS — J019 Acute sinusitis, unspecified: Secondary | ICD-10-CM

## 2023-12-25 MED ORDER — AMOXICILLIN-POT CLAVULANATE 875-125 MG PO TABS: 1.0000 | ORAL_TABLET | Freq: Two times a day (BID) | ORAL | 0 refills | Status: DC

## 2023-12-25 NOTE — Progress Notes (Signed)

## 2024-01-28 ENCOUNTER — Telehealth: Payer: Self-pay

## 2024-01-28 NOTE — Telephone Encounter (Signed)
 Ok sure, labs are already ordered to have done prior to her jan appt   thanks

## 2024-01-28 NOTE — Telephone Encounter (Signed)
 Copied from CRM [id number]. Topic: Appointments - Appointment Scheduling >> Jan 28, 2024  8:21 AM Rosina BIRCH wrote: Patient/patient representative is calling to schedule an appointment. Refer to attachments for appointment information.   Patient called wanting to know if she need to get her cholesterol checked again. Patient does have an appointment set up for 1/5 [phone]

## 2024-01-29 NOTE — Telephone Encounter (Signed)
 Called and let Pt know

## 2024-01-31 ENCOUNTER — Ambulatory Visit

## 2024-02-07 ENCOUNTER — Other Ambulatory Visit

## 2024-02-07 DIAGNOSIS — E559 Vitamin D deficiency, unspecified: Secondary | ICD-10-CM

## 2024-02-07 DIAGNOSIS — E538 Deficiency of other specified B group vitamins: Secondary | ICD-10-CM

## 2024-02-07 DIAGNOSIS — R739 Hyperglycemia, unspecified: Secondary | ICD-10-CM

## 2024-02-07 DIAGNOSIS — E7800 Pure hypercholesterolemia, unspecified: Secondary | ICD-10-CM | POA: Diagnosis not present

## 2024-02-07 LAB — CBC WITH DIFFERENTIAL/PLATELET
Basophils Absolute: 0.1 K/uL (ref 0.0–0.1)
Basophils Relative: 1.1 % (ref 0.0–3.0)
Eosinophils Absolute: 0.3 K/uL (ref 0.0–0.7)
Eosinophils Relative: 4.4 % (ref 0.0–5.0)
HCT: 37.1 % (ref 36.0–46.0)
Hemoglobin: 12.6 g/dL (ref 12.0–15.0)
Lymphocytes Relative: 36.8 % (ref 12.0–46.0)
Lymphs Abs: 2.1 K/uL (ref 0.7–4.0)
MCHC: 33.9 g/dL (ref 30.0–36.0)
MCV: 91.8 fl (ref 78.0–100.0)
Monocytes Absolute: 0.7 K/uL (ref 0.1–1.0)
Monocytes Relative: 11.7 % (ref 3.0–12.0)
Neutro Abs: 2.7 K/uL (ref 1.4–7.7)
Neutrophils Relative %: 46 % (ref 43.0–77.0)
Platelets: 189 K/uL (ref 150.0–400.0)
RBC: 4.04 Mil/uL (ref 3.87–5.11)
RDW: 14 % (ref 11.5–15.5)
WBC: 5.8 K/uL (ref 4.0–10.5)

## 2024-02-07 LAB — BASIC METABOLIC PANEL WITH GFR
BUN: 23 mg/dL (ref 6–23)
CO2: 29 meq/L (ref 19–32)
Calcium: 9.2 mg/dL (ref 8.4–10.5)
Chloride: 105 meq/L (ref 96–112)
Creatinine, Ser: 0.66 mg/dL (ref 0.40–1.20)
GFR: 92.79 mL/min
Glucose, Bld: 90 mg/dL (ref 70–99)
Potassium: 4.3 meq/L (ref 3.5–5.1)
Sodium: 142 meq/L (ref 135–145)

## 2024-02-07 LAB — URINALYSIS, ROUTINE W REFLEX MICROSCOPIC
Bilirubin Urine: NEGATIVE
Hgb urine dipstick: NEGATIVE
Ketones, ur: NEGATIVE
Leukocytes,Ua: NEGATIVE
Nitrite: NEGATIVE
Specific Gravity, Urine: 1.01 (ref 1.000–1.030)
Total Protein, Urine: NEGATIVE
Urine Glucose: NEGATIVE
Urobilinogen, UA: 0.2 (ref 0.0–1.0)
pH: 6 (ref 5.0–8.0)

## 2024-02-07 LAB — HEPATIC FUNCTION PANEL
ALT: 12 U/L (ref 3–35)
AST: 19 U/L (ref 5–37)
Albumin: 4.4 g/dL (ref 3.5–5.2)
Alkaline Phosphatase: 34 U/L — ABNORMAL LOW (ref 39–117)
Bilirubin, Direct: 0.1 mg/dL (ref 0.1–0.3)
Total Bilirubin: 0.4 mg/dL (ref 0.2–1.2)
Total Protein: 7 g/dL (ref 6.0–8.3)

## 2024-02-07 LAB — LIPID PANEL
Cholesterol: 238 mg/dL — ABNORMAL HIGH (ref 28–200)
HDL: 100 mg/dL
LDL Cholesterol: 128 mg/dL — ABNORMAL HIGH (ref 10–99)
NonHDL: 138.08
Total CHOL/HDL Ratio: 2
Triglycerides: 50 mg/dL (ref 10.0–149.0)
VLDL: 10 mg/dL (ref 0.0–40.0)

## 2024-02-07 LAB — VITAMIN B12: Vitamin B-12: 573 pg/mL (ref 211–911)

## 2024-02-07 LAB — TSH: TSH: 5.42 u[IU]/mL (ref 0.35–5.50)

## 2024-02-07 LAB — VITAMIN D 25 HYDROXY (VIT D DEFICIENCY, FRACTURES): VITD: 41.65 ng/mL (ref 30.00–100.00)

## 2024-02-07 LAB — HEMOGLOBIN A1C: Hgb A1c MFr Bld: 5.8 % (ref 4.6–6.5)

## 2024-02-10 ENCOUNTER — Ambulatory Visit: Admitting: Internal Medicine

## 2024-02-10 ENCOUNTER — Encounter: Payer: Self-pay | Admitting: Internal Medicine

## 2024-02-10 VITALS — BP 122/70 | HR 59 | Temp 98.5°F | Ht 67.0 in | Wt 143.0 lb

## 2024-02-10 DIAGNOSIS — E039 Hypothyroidism, unspecified: Secondary | ICD-10-CM

## 2024-02-10 DIAGNOSIS — Z23 Encounter for immunization: Secondary | ICD-10-CM

## 2024-02-10 DIAGNOSIS — E7849 Other hyperlipidemia: Secondary | ICD-10-CM

## 2024-02-10 DIAGNOSIS — K219 Gastro-esophageal reflux disease without esophagitis: Secondary | ICD-10-CM | POA: Diagnosis not present

## 2024-02-10 MED ORDER — LEVOTHYROXINE SODIUM 88 MCG PO TABS: 88.0000 ug | ORAL_TABLET | Freq: Every day | ORAL | 3 refills | Status: AC

## 2024-02-10 NOTE — Patient Instructions (Addendum)
 You had the flu shot today  Ok to increase the thyroid  medication to 0.88 mcg per day, then return to the lab in 4 weeks for follow up thyroid  panel  Ok to take Tumeric for joint aching  Ok to consider the [place] like pill coming soon for weight loss  Please continue all other medications as before, and refills have been done if requested.  Please have the pharmacy call with any other refills you may need.  Please continue your efforts at being more active, low cholesterol diet, and weight control.  Please keep your appointments with your specialists as you may have planned  Please make an Appointment to return in 6 months, or sooner if needed

## 2024-02-10 NOTE — Assessment & Plan Note (Signed)
 Stable, cont low fat diet and anti reflux precautions

## 2024-02-10 NOTE — Assessment & Plan Note (Signed)
 Lab Results  Component Value Date   LDLCALC 128 (H) 02/07/2024   uncontrolled, 2023 card CT score was 0, pt declines statin, now for lower chol diet

## 2024-02-10 NOTE — Progress Notes (Signed)
 Patient ID: [name], female   DOB: Jul 11, 1960, 64 y.o.   MRN: [id number]        Chief Complaint: follow up low thyroid , hld, gerd       HPI:  [name] is a 64 y.o. female here overall doing ok,  Pt denies chest pain, increased sob or doe, wheezing, orthopnea, PND, increased LE swelling, palpitations, dizziness or syncope.   Pt denies polydipsia, polyuria, or new focal neuro s/s.    Pt denies fever, wt loss, night sweats, loss of appetite, or other constitutional symptoms  Denies worsening reflux, abd pain, dysphagia, n/v, bowel change or blood. Due for flu shot       Wt Readings from Last 3 Encounters:  02/10/24 143 lb (64.9 kg)  10/03/23 138 lb 9.6 oz (62.9 kg)  09/28/22 140 lb (63.5 kg)   BP Readings from Last 3 Encounters:  02/10/24 122/70  10/03/23 94/62  09/28/22 120/72         Past Medical History:  Diagnosis Date   Allergic rhinitis    Allergy    Anemia    history of anemia secondary to fibroid sx   Anxiety and depression    Arthritis    Cervical disc disease 07/20/2010   Cyst of ovary    bilateral   Foot fracture, left    hx of   GERD (gastroesophageal reflux disease)    Hyperlipidemia    Hypothyroidism    IBS (irritable bowel syndrome)    Iron deficiency 07/20/2010   Low back pain    Lumbar disc disease 07/20/2010   Neuromuscular disorder (HCC)    Osteoporosis 07/20/2010   Post-operative nausea and vomiting    Renal stone 01/11/2020   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 She has never used smokeless tobacco. She reports that she does not drink alcohol and does not use drugs. family history includes Alcohol abuse in her father and mother; Heart attack in her father; Heart disease in her father; Varicose Veins in her mother. Allergies[1] Medications Ordered Prior to Encounter[2]      ROS:  All others reviewed and negative.  Objective        PE:  BP 122/70 (BP Location: Right Arm, Patient Position: Sitting, Cuff Size: Normal)   Pulse (!) 59   Temp 98.5 F (36.9 C) (Oral)   Ht 5' 7 (1.702 m)   Wt 143 lb (64.9 kg)   SpO2 99%   BMI 22.40 kg/m                 Constitutional: Pt appears in NAD               HENT: Head: NCAT.                Right Ear: External ear normal.                 Left Ear: External  ear normal.                Eyes: . Pupils are equal, round, and reactive to light. Conjunctivae and EOM are normal               Nose: without d/c or deformity               Neck: Neck supple. Gross normal ROM               Cardiovascular: Normal rate and regular rhythm.                 Pulmonary/Chest: Effort normal and breath sounds without rales or wheezing.                Abd:  Soft, NT, ND, + BS, no organomegaly               Neurological: Pt is alert. At baseline orientation, motor grossly intact               Skin: Skin is warm. No rashes, no other new lesions, LE edema - none               Psychiatric: Pt behavior is normal without agitation   Micro: none  Cardiac tracings I have personally interpreted today:  none  Pertinent Radiological findings (summarize): none   Lab Results  Component Value Date   WBC 5.8 02/07/2024   HGB 12.6 02/07/2024   HCT 37.1 02/07/2024   PLT 189.0 02/07/2024   GLUCOSE 90 02/07/2024   CHOL 238 (H) 02/07/2024   TRIG 50.0 02/07/2024   HDL 100.00 02/07/2024   LDLDIRECT 95.5 07/20/2010   LDLCALC 128 (H) 02/07/2024   ALT 12 02/07/2024   AST 19 02/07/2024    NA 142 02/07/2024   K 4.3 02/07/2024   CL 105 02/07/2024   CREATININE 0.66 02/07/2024   BUN 23 02/07/2024   CO2 29 02/07/2024   TSH 5.42 02/07/2024   HGBA1C 5.8 02/07/2024   Assessment/Plan:  [name] is a 64 y.o. White or Caucasian [1] female with  has a past medical history of Allergic rhinitis, Allergy, Anemia, Anxiety and depression, Arthritis, Cervical disc disease (07/20/2010), Cyst of ovary, Foot fracture, left, GERD (gastroesophageal reflux disease), Hyperlipidemia, Hypothyroidism, IBS (irritable bowel syndrome), Iron deficiency (07/20/2010), Low back pain, Lumbar disc disease (07/20/2010), Neuromuscular disorder (HCC), Osteoporosis (07/20/2010), Post-operative nausea and vomiting, Renal stone (01/11/2020), Symptomatic PVCs (07/20/2010), and Vitamin D  deficiency (07/20/2010).  Hypothyroidism Lab Results  Component Value Date   TSH 5.42 02/07/2024   uncontrolled, pt to increase levothyroxine  to 88 mcg every day, and f/u lab in 4 wks   HLD (hyperlipidemia) Lab Results  Component Value Date   LDLCALC 128 (H) 02/07/2024   uncontrolled, 2023 card CT score was 0, pt declines statin, now for lower chol diet   GERD (gastroesophageal reflux disease) Stable, cont low fat diet and anti reflux precautions  Followup: Return in about 6 months (around 08/09/2024).  Lynwood Rush, MD 02/10/2024 9:27 PM [facility]     [1]  Allergies Allergen Reactions   Sulfa Antibiotics Other (See Comments)   Ciprofloxacin  Other (See Comments)   Sulfonamide Derivatives Other (See Comments)    Pt unknown reaction    Sulfur Other (See Comments)  [2]  Current Outpatient Medications on File Prior to Visit  Medication Sig Dispense Refill   acetaminophen  (TYLENOL ) 500 MG tablet Take  1,000 mg by mouth every 6 (six) hours as needed for mild pain, moderate pain or headache.     docusate sodium (COLACE) 100 MG capsule Take 200  mg by mouth daily.     eletriptan  (RELPAX ) 40 MG tablet Take 1 tablet (40 mg total) by mouth every 2 (two) hours as needed for migraine or headache. May repeat in 2 hours if headache persists or recurs. 6 tablet 5   Current Facility-Administered Medications on File Prior to Visit  Medication Dose Route Frequency Provider Last Rate Last Admin   [START ON 04/28/2024] denosumab  (PROLIA ) injection 60 mg  60 mg Subcutaneous Q6 months Norleen Lynwood ORN, MD

## 2024-02-10 NOTE — Assessment & Plan Note (Signed)
 Lab Results  Component Value Date   TSH 5.42 02/07/2024   uncontrolled, pt to increase levothyroxine  to 88 mcg every day, and f/u lab in 4 wks

## 2024-02-12 ENCOUNTER — Encounter: Payer: Self-pay | Admitting: Internal Medicine

## 2024-02-21 ENCOUNTER — Encounter: Payer: Self-pay | Admitting: Internal Medicine

## 2024-02-21 MED ORDER — WEGOVY 1.5 MG PO TABS: 1.5000 mg | ORAL_TABLET | Freq: Every day | ORAL | 11 refills | Status: AC

## 2024-03-11 ENCOUNTER — Telehealth: Payer: Self-pay

## 2024-03-11 NOTE — Telephone Encounter (Signed)
 Copied from CRM #[id number]. Topic: General - Other >> Mar 10, 2024  2:50 PM Alfonso HERO wrote: Reason for CRM: patient stated she was waiting for Erin to call her because Dr. 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 If this is the case can someone reach out to her.

## 2024-03-11 NOTE — Telephone Encounter (Signed)
Appointment made and patient contacted.

## 2024-04-07 ENCOUNTER — Encounter: Admitting: Internal Medicine
